# Patient Record
Sex: Female | Born: 1955 | Race: Black or African American | Hispanic: No | Marital: Married | Smoking: Never smoker
Health system: Southern US, Community
[De-identification: ages and names within clinical notes are randomized; demographics above are authoritative.]

## PROBLEM LIST (undated history)

## (undated) DIAGNOSIS — E119 Type 2 diabetes mellitus without complications: Secondary | ICD-10-CM

## (undated) DIAGNOSIS — M1990 Unspecified osteoarthritis, unspecified site: Secondary | ICD-10-CM

## (undated) DIAGNOSIS — Z9889 Other specified postprocedural states: Secondary | ICD-10-CM

## (undated) DIAGNOSIS — G5600 Carpal tunnel syndrome, unspecified upper limb: Secondary | ICD-10-CM

## (undated) DIAGNOSIS — I10 Essential (primary) hypertension: Secondary | ICD-10-CM

## (undated) DIAGNOSIS — R112 Nausea with vomiting, unspecified: Secondary | ICD-10-CM

## (undated) DIAGNOSIS — T7840XA Allergy, unspecified, initial encounter: Secondary | ICD-10-CM

## (undated) DIAGNOSIS — E785 Hyperlipidemia, unspecified: Secondary | ICD-10-CM

## (undated) DIAGNOSIS — N189 Chronic kidney disease, unspecified: Secondary | ICD-10-CM

## (undated) HISTORY — DX: Essential (primary) hypertension: I10

## (undated) HISTORY — PX: TUBAL LIGATION: SHX77

## (undated) HISTORY — PX: SPINE SURGERY: SHX786

## (undated) HISTORY — DX: Type 2 diabetes mellitus without complications: E11.9

## (undated) HISTORY — DX: Hyperlipidemia, unspecified: E78.5

## (undated) HISTORY — DX: Allergy, unspecified, initial encounter: T78.40XA

---

## 1979-02-09 HISTORY — PX: OVARIAN CYST REMOVAL: SHX89

## 1980-02-09 HISTORY — PX: CHOLECYSTECTOMY: SHX55

## 1998-04-09 ENCOUNTER — Other Ambulatory Visit: Admission: RE | Admit: 1998-04-09 | Discharge: 1998-04-09 | Payer: Self-pay | Admitting: Obstetrics and Gynecology

## 1998-09-02 ENCOUNTER — Encounter: Payer: Self-pay | Admitting: Obstetrics and Gynecology

## 1998-09-02 ENCOUNTER — Ambulatory Visit (HOSPITAL_COMMUNITY): Admission: RE | Admit: 1998-09-02 | Discharge: 1998-09-02 | Payer: Self-pay | Admitting: Obstetrics and Gynecology

## 1999-09-07 ENCOUNTER — Ambulatory Visit (HOSPITAL_COMMUNITY): Admission: RE | Admit: 1999-09-07 | Discharge: 1999-09-07 | Payer: Self-pay | Admitting: Obstetrics and Gynecology

## 1999-09-07 ENCOUNTER — Encounter: Payer: Self-pay | Admitting: Obstetrics and Gynecology

## 1999-12-17 ENCOUNTER — Other Ambulatory Visit: Admission: RE | Admit: 1999-12-17 | Discharge: 1999-12-17 | Payer: Self-pay | Admitting: *Deleted

## 2000-09-27 ENCOUNTER — Encounter: Payer: Self-pay | Admitting: Obstetrics and Gynecology

## 2000-09-27 ENCOUNTER — Ambulatory Visit (HOSPITAL_COMMUNITY): Admission: RE | Admit: 2000-09-27 | Discharge: 2000-09-27 | Payer: Self-pay | Admitting: Obstetrics and Gynecology

## 2000-11-29 ENCOUNTER — Ambulatory Visit (HOSPITAL_COMMUNITY): Admission: RE | Admit: 2000-11-29 | Discharge: 2000-11-29 | Payer: Self-pay | Admitting: *Deleted

## 2000-11-29 ENCOUNTER — Encounter: Payer: Self-pay | Admitting: *Deleted

## 2001-02-08 HISTORY — PX: ABDOMINAL HYSTERECTOMY: SHX81

## 2001-03-07 ENCOUNTER — Other Ambulatory Visit: Admission: RE | Admit: 2001-03-07 | Discharge: 2001-03-07 | Payer: Self-pay | Admitting: Obstetrics and Gynecology

## 2001-06-16 ENCOUNTER — Encounter: Admission: RE | Admit: 2001-06-16 | Discharge: 2001-06-28 | Payer: Self-pay | Admitting: Occupational Medicine

## 2001-09-14 ENCOUNTER — Encounter (INDEPENDENT_AMBULATORY_CARE_PROVIDER_SITE_OTHER): Payer: Self-pay | Admitting: *Deleted

## 2001-09-15 ENCOUNTER — Inpatient Hospital Stay (HOSPITAL_COMMUNITY): Admission: RE | Admit: 2001-09-15 | Discharge: 2001-09-16 | Payer: Self-pay | Admitting: Obstetrics and Gynecology

## 2001-10-04 ENCOUNTER — Encounter: Payer: Self-pay | Admitting: Obstetrics and Gynecology

## 2001-10-04 ENCOUNTER — Ambulatory Visit (HOSPITAL_COMMUNITY): Admission: RE | Admit: 2001-10-04 | Discharge: 2001-10-04 | Payer: Self-pay | Admitting: Obstetrics and Gynecology

## 2002-10-24 ENCOUNTER — Other Ambulatory Visit: Admission: RE | Admit: 2002-10-24 | Discharge: 2002-10-24 | Payer: Self-pay | Admitting: Obstetrics and Gynecology

## 2003-08-21 ENCOUNTER — Emergency Department (HOSPITAL_COMMUNITY): Admission: EM | Admit: 2003-08-21 | Discharge: 2003-08-21 | Payer: Self-pay | Admitting: Family Medicine

## 2005-06-15 ENCOUNTER — Ambulatory Visit (HOSPITAL_COMMUNITY): Admission: RE | Admit: 2005-06-15 | Discharge: 2005-06-15 | Payer: Self-pay | Admitting: *Deleted

## 2005-06-15 ENCOUNTER — Encounter (INDEPENDENT_AMBULATORY_CARE_PROVIDER_SITE_OTHER): Payer: Self-pay | Admitting: Specialist

## 2006-12-17 ENCOUNTER — Encounter: Admission: RE | Admit: 2006-12-17 | Discharge: 2006-12-17 | Payer: Self-pay | Admitting: Orthopedic Surgery

## 2010-06-09 ENCOUNTER — Inpatient Hospital Stay (INDEPENDENT_AMBULATORY_CARE_PROVIDER_SITE_OTHER)
Admission: RE | Admit: 2010-06-09 | Discharge: 2010-06-09 | Disposition: A | Discharge status: Home / Self Care | Payer: BC Managed Care – PPO | Source: Ambulatory Visit | Attending: Emergency Medicine | Admitting: Emergency Medicine

## 2010-06-09 DIAGNOSIS — J40 Bronchitis, not specified as acute or chronic: Secondary | ICD-10-CM

## 2010-06-09 DIAGNOSIS — J019 Acute sinusitis, unspecified: Secondary | ICD-10-CM

## 2010-06-26 NOTE — H&P (Signed)
NAME:  Ashley Monroe, Ashley Monroe                      ACCOUNT NO.:  [id number]   MEDICAL RECORD NO.:  [id number]                   PATIENT TYPE:  AMB   LOCATION:  SDC                                  FACILITY:  [facility]   PHYSICIAN:  Sheronette A. Cherly Hensen, M.D.         DATE OF BIRTH:  12/14/1955   DATE OF ADMISSION:  09/14/2001  DATE OF DISCHARGE:                                HISTORY & PHYSICAL   CHIEF COMPLAINT:  Heavy menstrual cycles, fibroid uterus and pelvic  relaxation disorder.   HISTORY OF PRESENT ILLNESS:  This is a 55 year old gravida 2, para 2,  married black female with a history of a laparoscopic tubal ligation, last  menstrual period of August 20, 2001, who is now being admitted for total  vaginal hysterectomy, perineoplasty, possible rectocele repair.  The patient  has had heavy menstrual cycles with the need to use overnight pads four  times a day, which are soaked, for the first five days of her five-to-seven-  day cycle.  Her cycles are every 28 to 30 days.  She has also noted soiling  of her linens and increased cramps.  This has been ongoing for more than a  year.  The patient, at that time, was unaware of any fibroids or thyroid  disorder.  She denied any intermenstrual or postcoital bleeding.  Ultrasound  in February of 2003 revealed a right ovary with a simple 1.1-cm cyst, left  ovary was normal, normal endometrial stripe and two small uterine fibroids;  the largest was 2.3 cm.  Endometrial biopsy in February of 2003 showed  benign proliferative endometrium.  TSH was normal.  The patient's history is  notable for cervical dysplasia diagnosed in December of 1994.  Her most  recent Pap in January of 2003 showed atypical squamous cells with a high-  risk HPV capture that was negative subsequently.  The patient has also noted  lack of sensation during intercourse as a result of a gaping introitus; her  relationship otherwise is satisfactory with her husband.  She denies any  dyspareunia.  The patient desires surgical management of her menorrhagia and  uterine fibroids as well as her pelvic relaxation disorder.  She was found  to have a rectocele on examination and a mild cystocele, however, the  patient denied any urinary incontinence, urgency or dysuria.   ALLERGIES:  CODEINE.   MEDICATIONS:  1. Univasc 7.5 mg q.d.  2. Verapamil ER 250 mg q.d.  3. Topamax 75 mg q.h.s.   PAST MEDICAL HISTORY:  Chronic hypertension, diet-controlled adult-onset  diabetes, history of pseudotumor cerebri.   PAST SURGICAL HISTORY:  Tubal ligation, cryosurgery, laparoscopic  cholecystectomy, ovarian cystectomy in 1982, laparoscopically.   OBSTETRICAL HISTORY:  Vaginal delivery x2.   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   FAMILY HISTORY:  Paternal aunt -- breast cancer; diabetes in father, brother  and grandparents; cardiovascular disease in  paternal grandfather; mother has  hypertension and thyroid dysfunction.   SOCIAL HISTORY:  Married, two children.  She is a Chartered certified accountant at [employer].  She is a nonsmoker.   REVIEW OF SYSTEMS:  Migraine headaches secondary to the pseudotumor cerebri.   PHYSICAL EXAMINATION:  GENERAL:  Well-developed, well-nourished, obese black  female in no acute distress.   VITAL SIGNS:  Blood pressure 140/90, pulse of 92.  Weight is 250 pounds.   SKIN:  No lesions.   HEENT:  Anicteric sclerae, pink conjunctivae.  Oropharynx negative.   HEART:  Regular rate and rhythm without murmur.   NECK:  Supple.  No palpable thyroid.   NODES:  No axillary, supraclavicular or inguinal nodes palpable.   LUNGS:  Clear to auscultation.   BREASTS  Breasts are soft, nontender, no palpable mass, pedunculated, upward  nipples without discharge; left is greater than right.   ABDOMEN:  Abdomen is obese, soft.  No hepatosplenomegaly noted.  Infraumbilical and some small upper abdominal scars  noted.   BACK:  No CVA tenderness.   PELVIC:  External genitalia -- no palpable mass.  Visible gaping introitus  with a bulge posteriorly and anteriorly.  Vagina -- first degree cystocele  and rectocele noted.  Cervix -- no lesions.  Uterus anteverted but slightly  enlarged but limited by the patient's body habitus.  Adnexa -- no palpable  mass but again, limited by the patient's body habitus.  Urethra normal.  Bladder -- no palpable mass.   RECTAL:  Normal tone.  Negative rectovaginal septum except the confirmed  rectocele, mild, noted.  Guaiac was not done.   LABORATORY AND ACCESSORY CLINICAL DATA:  Mammogram, September 27, 2001, was  negative.   IMPRESSION:  1. Menorrhagia.  2. Uterine fibroids.  3. Pelvic relaxation disorder.  4. Chronic hypertension.  5. Adult-onset diabetes.  6. History of pseudotumor cerebri.  7. Exogenous obesity.   PLAN:  Admission, total vaginal hysterectomy, perineoplasty, possible  rectocele repair, antibiotic prophylaxis, antiembolic stockings, resumption  of her antihypertensive medications postoperatively.  Risks of the procedure  are explained to the patient including, but not limited to, inability to  complete the hysterectomy vaginally, infection, bleeding which may require  blood transfusion, fistula formation, injury to surrounding organ structures  such as the bladder, bowel or ureters, possible need for surgery in the  future to remove ovaries secondary to persistent ovarian cyst, ovarian  cancer; blood transfusion risks include, but not limited to, acute reaction,  hepatitis transmission, HIV transmission, 1 out of 100,000; risks of  rectocele repair were also discussed including possible need for repeat  surgery in the future, dyspareunia, distortion of the anatomy and possible  need for a cystocele repair in the future.  Postop care and criteria for discharge were discussed with the patient, as was the need for continued Pap  smears in  the future due to her history and despite a hysterectomy.  Dr. Brooke Bonito is the patient's primary and will be involved as necessary if any  of her medical conditions necessitate such.                                               Sheronette A. Cherly Hensen, M.D.    SAC/MEDQ  D:  09/14/2001  T:  09/14/2001  Job:  [id number]

## 2010-06-26 NOTE — Op Note (Signed)
NAMESIERRA, SPARGO            ACCOUNT NO.:  [id number]   MEDICAL RECORD NO.:  [id number]          PATIENT TYPE:  AMB   LOCATION:  ENDO                         FACILITY:  [facility]   PHYSICIAN:  Georgiana Spinner, M.D.    DATE OF BIRTH:  November 29, 1955   DATE OF PROCEDURE:  06/15/2005  DATE OF DISCHARGE:                                 OPERATIVE REPORT   PROCEDURE:  Upper endoscopy.   INDICATIONS:  GERD.   ANESTHESIA:  Demerol 50, Versed 5 mg.   PROCEDURE:  With the patient mildly sedated in left lateral decubitus  position, the Olympus videoscopic endoscope was inserted in the mouth,  passed under direction vision through the esophagus, which appeared normal,  into the stomach.  Fundus, body, antrum, duodenal bulb, second portion of  duodenum all appeared normal.  From this point, the endoscope was slowly  withdrawn, taking circumferential views of the duodenal mucosa until the  endoscope had been pulled back into the stomach, placed in retroflex and  viewed the stomach from below.  The endoscope was straightened and  withdrawn, taking circumferential views of the remaining gastric and  esophageal mucosa.  The patient's vital signs and pulse oximeter remained  stable.  The patient tolerated the procedure well without apparent  complication.   FINDINGS:  Unremarkable examination.   PLAN:  Proceed to colonoscopy.           ______________________________  Georgiana Spinner, M.D.     GMO/MEDQ  D:  06/15/2005  T:  06/16/2005  Job:  [id number]

## 2010-06-26 NOTE — Discharge Summary (Signed)
NAME:  Ashley Monroe, Ashley Monroe                      ACCOUNT NO.:  [id number]   MEDICAL RECORD NO.:  [id number]                   PATIENT TYPE:  INP   LOCATION:  9304                                 [facility]ACILITY:  [facility]   PHYSICIAN:  Sheronette A. Cherly Hensen, M.D.         DATE OF BIRTH:  11-14-1955   DATE OF ADMISSION:  09/14/2001  DATE OF DISCHARGE:  09/16/2001                                 DISCHARGE SUMMARY   ADMISSION DIAGNOSES:  1. Menorrhagia.  2. Uterine fibroids.  3. Pelvic relaxation disorder.   DISCHARGE DIAGNOSES:  1. Menorrhagia.  2. Uterine fibroids.  3. Enterocele.  4. Postoperative atelectasis.  5. Rectocele.  6. Cystocele.   PROCEDURE:  Examination under anesthesia, total vaginal hysterectomy,  bilateral partial salpingectomy, enterocele repair, McCall culdoplasty, and  posterior repair.   HISTORY OF PRESENT ILLNESS:  A 55 year old gravida 2, para 2 married black  female with history of laparoscopic tubal ligation admitted for total  vaginal hysterectomy, perineoplasty, possible rectocele repair secondary to  menorrhagia, uterine fibroids, and pelvic relaxation as noted.   HOSPITAL COURSE:  The patient was admitted to [facility].  She was  taken to the operating room where she underwent the above procedures.  Findings were a 10 week sized uterus, normal ovaries bilaterally, normal  tubes, elongated enterocele, large rectocele, first and second degree  cystocele.  The patient's postoperative course was notable for a temperature  spike of 101 for which blood cultures were obtained.  Urinalysis showed  small hemoglobin.  A repeat CBC showed a white count of 10.1, platelet count  of 75,000, and hematocrit of 37.3.  The fever was thought to be secondary to  atelectasis after examination did not reveal any other source.  Her abdomen  was soft, nondistended, good bowel sounds.  Scant blood in the vagina.  Rectovaginal examination:  No palpable mass and no stool in the  vault.  The  patient was otherwise tolerating a regular diet.  Incentive spirometry and  deep breathe and cough was encouraged.  The patient subsequently  defervesced.  Final blood culture was negative as was the urine culture.  The patient subsequently passed flatus, tolerating a regular diet, otherwise  unremarkable and therefore decision was made to discharge the patient.   DISPOSITION:  Home.   CONDITION ON DISCHARGE:  Stable.   DISCHARGE MEDICATIONS:  1. Tylox one to two tablets q.3-4h. p.r.n. pain.  2. Bextra 20 mg one to two tablets p.o. q.d. p.r.n. pain.  3. Colace 100 mg p.o. b.i.d.   FOLLOW UP:  Four weeks at [facility].   DISCHARGE INSTRUCTIONS:  Sitz baths t.i.d.  No strenuated bowel movements.  Call for temperature 100.4 or greater.  No heavy lifting or driving.  Nothing per vagina for four to six weeks.  Call if severe abdominal pain,  nausea, vomiting, increased bleeding from vagina.  Sheronette A. Cherly Hensen, M.D.    SAC/MEDQ  D:  10/27/2001  T:  10/30/2001  Job:  [id number]

## 2010-06-26 NOTE — Op Note (Signed)
NAMEMADIGAN, ROSENSTEEL            ACCOUNT NO.:  [id number]   MEDICAL RECORD NO.:  [id number]          PATIENT TYPE:  AMB   LOCATION:  ENDO                         FACILITY:  [facility]   PHYSICIAN:  Georgiana Spinner, M.D.    DATE OF BIRTH:  06-18-1955   DATE OF PROCEDURE:  06/15/2005  DATE OF DISCHARGE:                                 OPERATIVE REPORT   PROCEDURE:  Colonoscopy.   ENDOSCOPIST:  Georgiana Spinner, M.D.   INDICATIONS:  Diarrhea; colon cancer screening.   ANESTHESIA:  Demerol 20 mg, Versed 2 mg.   DESCRIPTION OF PROCEDURE:  With the patient mildly sedated in the left  lateral decubitus position, a rectal examination was performed, which  revealed trace-positive material.  Subsequently, the Olympus videoscopic  colonoscope was inserted into the rectum and passed under direct vision to  the cecum, identified by ileocecal valve and appendiceal orifice, both of  which were photographed.  From this point, the colonoscope was slowly  withdrawn, taking circumferential views of the colonic mucosa, stopping only  in the rectum, which appeared normal on direct and retroflexed view.  We  took random biopsies along the way of normal-appearing mucosa.  The  endoscope was straightened and withdrawn.  The patient's vital signs and  pulse oximetry remained stable.  The patient tolerated the procedure well  without apparent complications.   FINDINGS:  Internal hemorrhoids, otherwise an unremarkable colonoscopic  examination to the cecum.   PLAN:  Await biopsy reports.  The patient will call me for results and  follow up with me as needed as an outpatient.           ______________________________  Georgiana Spinner, M.D.     GMO/MEDQ  D:  06/15/2005  T:  06/16/2005  Job:  [id number]

## 2010-06-26 NOTE — Op Note (Signed)
NAME:  Ashley Monroe, Ashley Monroe                      ACCOUNT NO.:  [id number]   MEDICAL RECORD NO.:  [id number]                   PATIENT TYPE:  OBV   LOCATION:  9304                                 [facility]ACILITY:  [facility]   PHYSICIAN:  Sheronette A. Cherly Hensen, M.D.         DATE OF BIRTH:  Jun 23, 1955   DATE OF PROCEDURE:  09/14/2001  DATE OF DISCHARGE:                                 OPERATIVE REPORT   PREOPERATIVE DIAGNOSES:  1. Menorrhagia.  2. Uterine fibroids.  3. Pelvic relaxation disorder.   POSTOPERATIVE DIAGNOSES::  1. Menorrhagia.  2. Uterine fibroids.  3. Cystocele.  4. Rectocele.  5. Enterocele.   PROCEDURES:  1. Examination under anesthesia.  2. Total vaginal hysterectomy.  3. Bilateral partial salpingectomy.  4. Enterocele repair.  5. McCall culdoplasty.  6. Rectocele repair.   ANESTHESIA:  General.   SURGEON:  Sheronette A. Cousins, M.D.   SPECIMENS:  Enterocele sac, uterus and cervix, and distal portion of lower  fallopian tubes.   ESTIMATED BLOOD LOSS:  350 cc.   INTRAOPERATIVE FLUIDS:  2800 cc crystalloid.   URINE OUTPUT:  About 100 cc clear yellow urine.   COMPLICATIONS:  None.   INDICATIONS:  This is a 55 year old gravida 2, para 2, married black female  with a history of tubal ligation, with menorrhagia and associated uterine  fibroids, who presents for surgical management.  The patient also was found  to have both a rectocele and a cystocele.  She was asymptomatic from the  cystocele.  The patient, however, has noted decreased sensation with  intercourse as a result of a voluminous and gaping vagina.  The patient now  presents for surgical management.  Risks and benefits of the procedures have  been explained to the patient.  Consent was signed, and the patient was  transferred to the operating room, Ancef was infused for antibiotic  prophylaxis, and antiembolic stockings were in place.   DESCRIPTION OF PROCEDURE:  The patient was taken to the operating  room,  where she was placed in the dorsal lithotomy position using Allen stirrups.  General anesthesia was induced without incident.  Examination under  anesthesia revealed an eight to 10-week size uterus.  No adnexal masses  could be appreciated.  The patient was sterilely prepped and draped in the  usual fashion.  The bladder was catheterized for a small amount of urine.  A  bivalve speculum was placed in the vagina.  The anterior and posterior lip  of the cervix were grasped with a Jacobson clamp.  A 1:1 dilute solution of  Pitressin was injected circumferentially at the junction of the cervical-  vaginal area.  A circumferential incision was then made anteriorly and made  a V posteriorly.  Mayo scissors were then used to sharply perform  dissection.  Systematically the posterior cul-de-sac was opened and extended  bilaterally.  Anteriorly the bladder was continued to be pushed upward with  sharp dissection.  The uterosacral ligaments  were subsequently bilaterally  clamped, cut, suture ligated with 0 Vicryl suture.  The cardinal ligaments  were then clamped, cut, and suture ligated using 0 Vicryl suture.  Attempt  again was made to open the anterior cul-de-sac.  This was subsequently  opened without incident, and the hysterectomy was then continued with the  uterine vessels being bilaterally clamped, cut, and suture ligated with 0  Vicryl suture.  Additional clamp, cut, and suture ligation was then  performed until the utero-ovarian ligaments and round ligament complex were  then identified, at which time they were clamped, cut, and attention was  then turned to the contralateral side, which with the mobility of the uterus  now, the left utero-ovarian ligament and round ligament were clamped  proximally and distal to the uterus, and the uterus severed from its  attachment.  The pedicles were then on the right closed with a Heaney suture  of 0 Vicryl and on the left because of two  pedicles were free-tied and  suture ligated with 0 Vicryl, and this was performed.  Subsequently more  bleeding was noted on that left-hand pedicle, at which time a 0 Vicryl was  then placed in a free-tie fashion.  Good hemostasis then noted.  The  ovaries, though prominent, were normal bilaterally.  The fallopian tubes  bilaterally were quite elongated and continued to descend into the vaginal  aspect of the surgery, at which time the decision was then made to perform a  distal partial salpingectomy for future.  The left fallopian tube was  grasped and extended and the end clamped as far proximally as possible and  severed from its attachment.  The subsequent pedicle was then suture ligated  with 3-0 Vicryl suture.  The same procedure was performed on the  contralateral side with respect to the right fallopian tube.  Inspection of  the vagina, which was voluminous, was notable for a large rectocele  proximally, second degree cystocele.  At the proximal aspect of the vaginal  cuff, there was a small indentation in the peritoneum consistent with an  enterocele.  The sac of the enterocele was isolated and a pursestring of 2-0  Vicryl was then placed circumferentially and the sac was then cut for  hemostasis.  Attention was then turned to the uterosacral ligaments, where 0  Vicryl was then run along the posterior aspect of the cul-de-sac in order to  perform a culdoplasty.  This was then tagged and tied.  Attention was then  turned to performing the rectocele.  The rectal exam was performed.  The  rectocele was noted to be fairly extensive and extended somewhat superiorly.  The hymenal ring was then identified at its furthest edge.  An inverted  triangle incision was then made on the perineum and in the midline, the  posterior vaginal wall was undermined and a vertical incision was then  concomitantly made all the way to the superior aspect of the vaginal cuff. The posterior wall was then  bluntly dissected off the posterior vagina on  the right as well as on the left, and a small amount of the dissection  necessitated the scalpel.  Once the rectocele was mobilized, there was noted  to be a fair amount of redundant tissue in the rectal area and pursestring 0  Vicryl sutures were then made in order to close down the defect.  Small  bleeders were cauterized throughout with good hemostasis noted under the  surface.  Vicryl 0 interrupted figure-of-eight sutures were  then placed to  reinforce over the repaired bladder, and at this point attention was then  placed toward completing the procedure.  The vagina was begun to be closed  with interrupted 0 Vicryl suture.  The redundant tissue from the rectocele  was removed following the line of the labia major.  The uterosacral  ligaments were then approximated in the midline and the rest of the vagina  was then closed with interrupted 0 Vicryl single suture.  When the hymenal  ring was reached from the closure of the vagina in its midline, 3-0 chromic  suture was then used to close the perineum in consistent form of an  episiotomy repair.  The vagina was then irrigated, suctioned, a vaginal  packing was placed with estrogen cream, triple sulfa was not available.  An  indwelling Foley catheter was then subsequently sterilely placed.  Rectal  examination did not reveal any defect.  Sponge and instrument counts x2 were  correct.  The patient tolerated the procedure well, was transferred to the  recovery room in stable condition.                                                 Sheronette A. Cherly Hensen, M.D.    SAC/MEDQ  D:  09/14/2001  T:  09/16/2001  Job:  [id number]

## 2010-11-14 ENCOUNTER — Ambulatory Visit (INDEPENDENT_AMBULATORY_CARE_PROVIDER_SITE_OTHER): Payer: BC Managed Care – PPO

## 2010-11-14 ENCOUNTER — Inpatient Hospital Stay (INDEPENDENT_AMBULATORY_CARE_PROVIDER_SITE_OTHER)
Admission: RE | Admit: 2010-11-14 | Discharge: 2010-11-14 | Disposition: A | Discharge status: Home / Self Care | Payer: BC Managed Care – PPO | Source: Ambulatory Visit | Attending: Family Medicine | Admitting: Family Medicine

## 2010-11-14 DIAGNOSIS — I10 Essential (primary) hypertension: Secondary | ICD-10-CM

## 2010-11-14 DIAGNOSIS — S96819A Strain of other specified muscles and tendons at ankle and foot level, unspecified foot, initial encounter: Secondary | ICD-10-CM

## 2010-11-14 DIAGNOSIS — M7660 Achilles tendinitis, unspecified leg: Secondary | ICD-10-CM

## 2010-11-14 DIAGNOSIS — S93499A Sprain of other ligament of unspecified ankle, initial encounter: Secondary | ICD-10-CM

## 2010-11-17 ENCOUNTER — Other Ambulatory Visit: Payer: Self-pay

## 2010-11-17 DIAGNOSIS — M7660 Achilles tendinitis, unspecified leg: Secondary | ICD-10-CM

## 2010-11-17 DIAGNOSIS — M7730 Calcaneal spur, unspecified foot: Secondary | ICD-10-CM

## 2010-11-19 ENCOUNTER — Ambulatory Visit
Admission: RE | Admit: 2010-11-19 | Discharge: 2010-11-19 | Disposition: A | Discharge status: Home / Self Care | Payer: BC Managed Care – PPO | Source: Ambulatory Visit

## 2010-11-19 DIAGNOSIS — M7660 Achilles tendinitis, unspecified leg: Secondary | ICD-10-CM

## 2010-11-19 DIAGNOSIS — M7730 Calcaneal spur, unspecified foot: Secondary | ICD-10-CM

## 2011-03-10 ENCOUNTER — Ambulatory Visit: Payer: BC Managed Care – PPO | Admitting: Physical Therapy

## 2011-03-10 DIAGNOSIS — IMO0001 Reserved for inherently not codable concepts without codable children: Secondary | ICD-10-CM | POA: Insufficient documentation

## 2011-03-10 DIAGNOSIS — M25579 Pain in unspecified ankle and joints of unspecified foot: Secondary | ICD-10-CM | POA: Insufficient documentation

## 2011-03-22 ENCOUNTER — Ambulatory Visit: Payer: BC Managed Care – PPO | Admitting: Rehabilitation

## 2011-03-22 DIAGNOSIS — IMO0001 Reserved for inherently not codable concepts without codable children: Secondary | ICD-10-CM | POA: Insufficient documentation

## 2011-03-22 DIAGNOSIS — M25579 Pain in unspecified ankle and joints of unspecified foot: Secondary | ICD-10-CM | POA: Insufficient documentation

## 2011-03-24 ENCOUNTER — Ambulatory Visit: Payer: BC Managed Care – PPO | Admitting: Physical Therapy

## 2011-03-30 ENCOUNTER — Ambulatory Visit: Payer: BC Managed Care – PPO | Admitting: Physical Therapy

## 2011-04-01 ENCOUNTER — Ambulatory Visit: Payer: BC Managed Care – PPO | Admitting: Physical Therapy

## 2011-04-06 ENCOUNTER — Ambulatory Visit: Payer: BC Managed Care – PPO | Admitting: Physical Therapy

## 2011-04-07 ENCOUNTER — Ambulatory Visit: Payer: BC Managed Care – PPO | Admitting: Physical Therapy

## 2011-04-12 ENCOUNTER — Ambulatory Visit: Payer: BC Managed Care – PPO | Admitting: Physical Therapy

## 2011-04-12 DIAGNOSIS — IMO0001 Reserved for inherently not codable concepts without codable children: Secondary | ICD-10-CM | POA: Insufficient documentation

## 2011-04-12 DIAGNOSIS — M25579 Pain in unspecified ankle and joints of unspecified foot: Secondary | ICD-10-CM | POA: Insufficient documentation

## 2011-04-14 ENCOUNTER — Ambulatory Visit: Payer: BC Managed Care – PPO | Admitting: Physical Therapy

## 2013-05-09 LAB — HM COLONOSCOPY

## 2013-07-03 ENCOUNTER — Other Ambulatory Visit: Payer: Self-pay

## 2013-07-03 DIAGNOSIS — Z1231 Encounter for screening mammogram for malignant neoplasm of breast: Secondary | ICD-10-CM

## 2013-07-06 ENCOUNTER — Ambulatory Visit
Admission: RE | Admit: 2013-07-06 | Discharge: 2013-07-06 | Disposition: A | Discharge status: Home / Self Care | Payer: BC Managed Care – PPO | Source: Ambulatory Visit

## 2013-07-06 DIAGNOSIS — Z1231 Encounter for screening mammogram for malignant neoplasm of breast: Secondary | ICD-10-CM

## 2013-07-09 ENCOUNTER — Ambulatory Visit: Payer: BC Managed Care – PPO

## 2015-04-23 ENCOUNTER — Encounter: Payer: Self-pay | Admitting: *Deleted

## 2015-04-23 ENCOUNTER — Encounter: Payer: BLUE CROSS/BLUE SHIELD | Attending: Internal Medicine | Admitting: *Deleted

## 2015-04-23 VITALS — Ht 65.0 in | Wt 241.8 lb

## 2015-04-23 DIAGNOSIS — E119 Type 2 diabetes mellitus without complications: Secondary | ICD-10-CM | POA: Diagnosis not present

## 2015-04-23 NOTE — Patient Instructions (Signed)
Plan:  Aim for 2-3 Carb Choices per meal (30-45 grams) +/- 1 either way  Aim for 0-15 Carbs per snack if hungry  Include protein in moderation with your meals and snacks Consider reading food labels for Total Carbohydrate and Fat Grams of foods Consider  increasing your activity level daily as tolerated Consider checking BG at alternate times per day as directed by MD  continue taking medication as directed by MD  4:30 - Test glucose and have a snack 8:30 - Breakfast - Metformin 12:00 - Test - Lunch 2:00 Test - Chill - snack if needed 5:30 Off work - Snack 8:00 Dinner - Levemir, Metformin, Onglyza 11:00 Test - Bed  Consider snacks/Breakfast: [employer] Greek Yogurt Bread & peanut butter  What affects your sugar?  Food - stress - activity - sleep (lack there of)

## 2015-04-23 NOTE — Progress Notes (Signed)
Diabetes Self-Management Education  Visit Type: First/Initial  Appt. Start Time: 1400 Appt. End Time: 1530  04/23/2015  Ms. Ashley Monroe, identified by name and date of birth, is a 60 y.o. female with a diagnosis of Diabetes: Type 2. Ashley Monroe presents for a "tune Up" on her DSME. She received education at the time of diagnosis. Her primary interest is review the dietary recommendations. We reviewed various reasons for her glucose being out of control at present. This would include, eating behavior, lack of activity, stress and lack of sleep.  ASSESSMENT  Height 5\' 5"  (1.651 m), weight 241 lb 12.8 oz (109.68 kg). Body mass index is 40.24 kg/(m^2).      Diabetes Self-Management Education - 04/23/15 1414    Visit Information   Visit Type First/Initial   Initial Visit   Diabetes Type Type 2   Are you currently following a meal plan? No   Are you taking your medications as prescribed? Yes   Date Diagnosed 2007   Health Coping   How would you rate your overall health? Fair   Psychosocial Assessment   Patient Belief/Attitude about Diabetes Motivated to manage diabetes   Self-care barriers None   Self-management support Doctor's office;CDE visits   Other persons present Patient   Patient Concerns Nutrition/Meal planning;Medication;Monitoring;Healthy Lifestyle;Glycemic Control   Special Needs None   Preferred Learning Style No preference indicated   Learning Readiness Change in progress   How often do you need to have someone help you when you read instructions, pamphlets, or other written materials from your doctor or pharmacy? 2 - Rarely   What is the last grade level you completed in school? 2 1/2 yrs @ [employer]   Complications   Last HgB A1C per patient/outside source 8.3 %   How often do you check your blood sugar? 3-4 times/day   Fasting Blood glucose range (mg/dL) 2008   Have you had a dilated eye exam in the past 12 months? Yes   Have you had a dental  exam in the past 12 months? Yes   Are you checking your feet? Yes   How many days per week are you checking your feet? 6   Dietary Intake   Breakfast oatmeal / boiled egg & [id number] bacon X2 / activia yogurt / water / coffee equal & creamer   Lunch salmon & crackrs, water / tossed salade & water & fruit cup / Frozen meals  (microwave)   Dinner baked pork, chicken, burger, beef, salmon + 2 vegetables    Beverage(s) water, coffee, Fruit 2-0,   Exercise   Exercise Type ADL's   Patient Education   Previous Diabetes Education Yes (please comment)  At time of diagnosis   Disease state  Definition of diabetes, type 1 and 2, and the diagnosis of diabetes;Factors that contribute to the development of diabetes   Nutrition management  Role of diet in the treatment of diabetes and the relationship between the three main macronutrients and blood glucose level;Carbohydrate counting;Reviewed blood glucose goals for pre and post meals and how to evaluate the patients' food intake on their blood glucose level.;Information on hints to eating out and maintain blood glucose control.;Meal options for control of blood glucose level and chronic complications.   Physical activity and exercise  Role of exercise on diabetes management, blood pressure control and cardiac health.   Monitoring Purpose and frequency of SMBG.;Identified appropriate SMBG and/or A1C goals.   Psychosocial adjustment Role of stress on diabetes   Individualized  Goals (developed by patient)   Nutrition General guidelines for healthy choices and portions discussed   Monitoring  test my blood glucose as discussed   Problem Solving How do you get more sleep during the week. Son caring for his child and allowing you to go to bed.   Outcomes   Expected Outcomes Demonstrated interest in learning. Expect positive outcomes   Future DMSE PRN   Program Status Completed      Individualized Plan for Diabetes Self-Management Training:   Learning  Objective:  Patient will have a greater understanding of diabetes self-management. Patient education plan is to attend individual and/or group sessions per assessed needs and concerns.   Plan:   Patient Instructions  Plan:  Aim for 2-3 Carb Choices per meal (30-45 grams) +/- 1 either way  Aim for 0-15 Carbs per snack if hungry  Include protein in moderation with your meals and snacks Consider reading food labels for Total Carbohydrate and Fat Grams of foods Consider  increasing your activity level daily as tolerated Consider checking BG at alternate times per day as directed by MD  continue taking medication as directed by MD  4:30 - Test glucose and have a snack 8:30 - Breakfast - Metformin 12:00 - Test - Lunch 2:00 Test - Chill - snack if needed 5:30 Off work - Snack 8:00 Dinner - Levemir, Metformin, Onglyza 11:00 Test - Bed  Consider snacks/Breakfast: [employer] & fit Greek Yogurt Bread & peanut butter  What affects your sugar?  Food - stress - activity - sleep (lack there of)   Expected Outcomes:  Demonstrated interest in learning. Expect positive outcomes  Education material provided: Living Well with Diabetes, A1C conversion sheet and Meal plan card  If problems or questions, patient to contact team via:  Phone  Future DSME appointment: PRN

## 2015-05-07 ENCOUNTER — Encounter: Payer: Self-pay | Admitting: Internal Medicine

## 2016-08-12 DIAGNOSIS — E1122 Type 2 diabetes mellitus with diabetic chronic kidney disease: Secondary | ICD-10-CM | POA: Insufficient documentation

## 2016-08-12 DIAGNOSIS — N182 Chronic kidney disease, stage 2 (mild): Secondary | ICD-10-CM | POA: Insufficient documentation

## 2016-08-12 DIAGNOSIS — R079 Chest pain, unspecified: Secondary | ICD-10-CM | POA: Insufficient documentation

## 2016-08-12 DIAGNOSIS — N08 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: Secondary | ICD-10-CM | POA: Insufficient documentation

## 2016-08-12 DIAGNOSIS — Z79899 Other long term (current) drug therapy: Secondary | ICD-10-CM | POA: Insufficient documentation

## 2016-08-12 DIAGNOSIS — I129 Hypertensive chronic kidney disease with stage 1 through stage 4 chronic kidney disease, or unspecified chronic kidney disease: Secondary | ICD-10-CM | POA: Insufficient documentation

## 2016-08-17 ENCOUNTER — Ambulatory Visit (INDEPENDENT_AMBULATORY_CARE_PROVIDER_SITE_OTHER): Payer: BLUE CROSS/BLUE SHIELD | Admitting: Cardiovascular Disease

## 2016-08-17 ENCOUNTER — Encounter: Payer: Self-pay | Admitting: Cardiovascular Disease

## 2016-08-17 VITALS — BP 141/86 | HR 87 | Ht 65.0 in | Wt 231.2 lb

## 2016-08-17 DIAGNOSIS — I10 Essential (primary) hypertension: Secondary | ICD-10-CM | POA: Diagnosis not present

## 2016-08-17 DIAGNOSIS — R0789 Other chest pain: Secondary | ICD-10-CM | POA: Diagnosis not present

## 2016-08-17 DIAGNOSIS — E7800 Pure hypercholesterolemia, unspecified: Secondary | ICD-10-CM | POA: Diagnosis not present

## 2016-08-17 MED ORDER — ROSUVASTATIN CALCIUM 20 MG PO TABS: 20.0000 mg | ORAL_TABLET | Freq: Every day | ORAL | 5 refills | Status: DC

## 2016-08-17 MED ORDER — AMLODIPINE BESYLATE 5 MG PO TABS: 5.0000 mg | ORAL_TABLET | Freq: Every day | ORAL | 5 refills | Status: DC

## 2016-08-17 NOTE — Progress Notes (Signed)
Cardiology Office Note   Date:  08/19/2016   ID:  ZAKEYA JUNKER, DOB 1955-12-03, MRN [id number]  PCP:  Dorothyann Peng, MD  Cardiologist:   Chilton Si, MD   Chief Complaint  Patient presents with  . New Patient (Initial Visit)      History of Present Illness: Ashley Monroe is a 61 y.o. female with hypertension, diabetes, and CKD II who is being seen today for the evaluation of chest pain at the request of Dorothyann Peng, MD.  Ms. Childers saw Dr. Allyne Gee on 06/30/16 and reported chest pain.  She has noted epigastric soreness that is worse when she is in bed.  The pain has been so bad at times that she can't turn in bed.  She also noted chest congestion and dyspnea on exertion.  It feels like an 8/10 pulling sensation.  For the last three months she has constant discomfort in her epigastrum and chest.  It is associated with shortness of breath.  She doesn't typically have nausea or diaphoresis but did with one episode.  Ms. Klimas does not get much exercise.  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  Ms. Bruney denies lower extremity edema, orthopnea, or PND.  Her BP typically runs in the 130s/80s.  She reports occasional palpitations but no lightheadedness or dizziness.    Past Medical History:  Diagnosis Date  . Allergy   . Diabetes mellitus without complication (HCC)   . Hyperlipidemia 08/19/2016  . Hypertension     Past Surgical History:  Procedure Laterality Date  . ABDOMINAL HYSTERECTOMY    . CHOLECYSTECTOMY    . OVARIAN CYST REMOVAL       Current Outpatient Prescriptions  Medication Sig Dispense Refill  . cetirizine (ZYRTEC) 10 MG tablet Take 10 mg by mouth daily.    . Insulin Detemir (LEVEMIR FLEXTOUCH) 100 UNIT/ML Pen Inject 10 Units as directed at bedtime.    . insulin detemir (LEVEMIR) 100 UNIT/ML injection Inject 6 Units into the skin daily after supper.    . metFORMIN (GLUCOPHAGE) 500 MG tablet Take by mouth 2  (two) times daily with a meal.    . olmesartan-hydrochlorothiazide (BENICAR HCT) 40-25 MG tablet Take 1 tablet by mouth daily.    . saxagliptin HCl (ONGLYZA) 5 MG TABS tablet Take 5 mg by mouth.    Marland Kitchen amLODipine (NORVASC) 5 MG tablet Take 1 tablet (5 mg total) by mouth daily. 30 tablet 5  . rosuvastatin (CRESTOR) 20 MG tablet Take 1 tablet (20 mg total) by mouth daily. 30 tablet 5   No current facility-administered medications for this visit.     Allergies:   Amoxicillin and Codeine    Social History:  The patient  reports that she has never smoked. She has never used smokeless tobacco.   Family History:  The patient's family history includes Alzheimer's disease in her father and maternal grandmother; Aneurysm in her mother; Atrial fibrillation in her brother and mother; Cancer in her paternal grandmother; Diabetes in her brother and father; Heart attack in her maternal grandfather and mother; Stroke in her brother and mother.    ROS:  Please see the history of present illness.   Otherwise, review of systems are positive for none.   All other systems are reviewed and negative.    PHYSICAL EXAM: VS:  BP (!) 141/86   Pulse 87   Ht 5\' 5"  (1.651 m)   Wt 104.9 kg (231  lb 3.2 oz)   BMI 38.47 kg/m  , BMI Body mass index is 38.47 kg/m. GENERAL:  Well appearing HEENT:  Pupils equal round and reactive, fundi not visualized, oral mucosa unremarkable NECK:  No jugular venous distention, waveform within normal limits, carotid upstroke brisk and symmetric, no bruits, no thyromegaly LYMPHATICS:  No cervical adenopathy LUNGS:  Clear to auscultation bilaterally HEART:  RRR.  PMI not displaced or sustained,S1 and S2 within normal limits, no S3, no S4, no clicks, no rubs, no murmurs ABD:  Flat, positive bowel sounds normal in frequency in pitch, no bruits, no rebound, no guarding, no midline pulsatile mass, no hepatomegaly, no splenomegaly EXT:  2 plus pulses throughout, no edema, no cyanosis no  clubbing SKIN:  No rashes no nodules NEURO:  Cranial nerves II through XII grossly intact, motor grossly intact throughout PSYCH:  Cognitively intact, oriented to person place and time   EKG:  EKG is ordered today. The ekg ordered today demonstrates Sinus rhythm. 87 bpm.   Recent Labs: No results found for requested labs within last 8760 hours.   06/30/16: Sodium 140, potassium 4.1, BUN 18, creatinine 0.85 AST 17, ALT 19 Total cholesterol 196, triglycerides 277, HDL 38, LDL 103  Lipid Panel No results found for: CHOL, TRIG, HDL, CHOLHDL, VLDL, LDLCALC, LDLDIRECT    Wt Readings from Last 3 Encounters:  08/17/16 104.9 kg (231 lb 3.2 oz)  04/23/15 109.7 kg (241 lb 12.8 oz)      ASSESSMENT AND PLAN:  # Atypcial chest pain:  Ms. Wragg's chest pain is atypical and the episodes that occur laying down at night are concerning for GERD.  However, she has several risk factors.  We will get an ETT to assess for ischemia.   # Hyperlipidemia: ASCVD 10 year Risk is 23.4%. We will start rosuvastatin 20 mg daily.  She will need lipids and a CMP at follow-up.  Continue aspirin 81 mg daily.   # Hypertension: Blood pressure is poorly-controlled. Add amlodipine 5 mg daily. Continue on olmesartan 40 mg and hydrochlorothiazide 25 mg daily.  Current medicines are reviewed at length with the patient today.  The patient does not have concerns regarding medicines.  The following changes have been made:  start rosuvastatin  Labs/ tests ordered today include:   Orders Placed This Encounter  Procedures  . Exercise Tolerance Test  . EKG 12-Lead     Disposition:   FU with Everlynn Sagun C. Duke Salvia, MD, [facility] in 6 weeks.     This note was written with the assistance of speech recognition software.  Please excuse any transcriptional errors.  Signed, Hao Dion C. Duke Salvia, MD, [facility]  08/19/2016 12:00 PM     [facility]

## 2016-08-17 NOTE — Patient Instructions (Addendum)
Medication Instructions:  START AMLODIPINE 5 MG DAILY  START ROSUVASTATIN 20 MG DAILY   Labwork: FASTING LP/CMET WHEN YOU RETURN IN 6 WEEKS   Testing/Procedures: Your physician has requested that you have an exercise tolerance test. For further information please visit [email or web address] Please also follow instruction sheet, as given.  Follow-Up: Your physician recommends that you schedule a follow-up appointment in: 6 WEEKS   If you need a refill on your cardiac medications before your next appointment, please call your pharmacy.

## 2016-08-19 ENCOUNTER — Telehealth (HOSPITAL_COMMUNITY): Payer: Self-pay

## 2016-08-19 ENCOUNTER — Encounter: Payer: Self-pay | Admitting: Cardiovascular Disease

## 2016-08-19 DIAGNOSIS — E785 Hyperlipidemia, unspecified: Secondary | ICD-10-CM

## 2016-08-19 HISTORY — DX: Hyperlipidemia, unspecified: E78.5

## 2016-08-19 NOTE — Telephone Encounter (Signed)
Encounter complete. 

## 2016-08-24 ENCOUNTER — Ambulatory Visit (HOSPITAL_COMMUNITY)
Admission: RE | Admit: 2016-08-24 | Discharge: 2016-08-24 | Disposition: A | Discharge status: Home / Self Care | Payer: BLUE CROSS/BLUE SHIELD | Source: Ambulatory Visit | Attending: Cardiovascular Disease | Admitting: Cardiovascular Disease

## 2016-08-24 DIAGNOSIS — R0789 Other chest pain: Secondary | ICD-10-CM | POA: Diagnosis present

## 2016-08-24 DIAGNOSIS — R5383 Other fatigue: Secondary | ICD-10-CM | POA: Diagnosis not present

## 2016-10-05 ENCOUNTER — Ambulatory Visit: Payer: BLUE CROSS/BLUE SHIELD | Admitting: Cardiovascular Disease

## 2016-10-05 NOTE — Progress Notes (Signed)
Cardiology Office Note   Date:  10/06/2016   ID:  Berda, Shelvin 04/24/1955, MRN [id number]  PCP:  Dorothyann Peng, MD  Cardiologist:   Chilton Si, MD   Chief Complaint  Patient presents with  . Follow-up    pt reports fatigue    History of Present Illness: LAKERA VIALL is a 61 y.o. female with hypertension, diabetes, and CKD II here for follow up. She was seen 08/2016 for the evaluation of chest pain.  Her symptoms were atypical and seemed more consistent with GERD.  However, given her risk factors she was referred for an ETT.  She underwent stress testing on 08/24/16 that was low risk for ischemia.  She achieved 7 METS on a Bruce protocol.  At that appointment her ASCVD 10 year risk was greater than 10%, so she was started on rosuvastatin.  She has concerns about statins because her mother had a lot of side effects.  Since starting that medication she has noted cramping in bilateral legs. She is otherwise been feeling well and denies any chest pain or shortness of breath. She hasn't noted any lower extremity edema, orthopnea, or PND.  At her last appointment she was also started on amlodipine due to poorly-controlled blood pressure. She has not had any side effects of this medicine and is doing   Past Medical History:  Diagnosis Date  . Allergy   . Diabetes mellitus without complication (HCC)   . Hyperlipidemia 08/19/2016  . Hypertension     Past Surgical History:  Procedure Laterality Date  . ABDOMINAL HYSTERECTOMY    . CHOLECYSTECTOMY    . OVARIAN CYST REMOVAL       Current Outpatient Prescriptions  Medication Sig Dispense Refill  . amLODipine (NORVASC) 5 MG tablet Take 1 tablet (5 mg total) by mouth daily. 30 tablet 5  . cetirizine (ZYRTEC) 10 MG tablet Take 10 mg by mouth daily.    . Insulin Detemir (LEVEMIR FLEXTOUCH) 100 UNIT/ML Pen Inject 10 Units as directed at bedtime.    . metFORMIN (GLUCOPHAGE) 500 MG tablet Take by mouth 2 (two) times daily  with a meal.    . olmesartan-hydrochlorothiazide (BENICAR HCT) 40-25 MG tablet Take 1 tablet by mouth daily.    . rosuvastatin (CRESTOR) 20 MG tablet Take 20 mg by mouth as directed. 1/2 TABLET BY MOUTH ON Monday, Wednesday, AND Friday ONLY    . Vitamin D, Ergocalciferol, (DRISDOL) 50000 units CAPS capsule Take 1 capsule by mouth 2 (two) times a week. Tuesdays and Fridays  2   No current facility-administered medications for this visit.     Allergies:   Amoxicillin and Codeine    Social History:  The patient  reports that she has never smoked. She has never used smokeless tobacco.   Family History:  The patient's family history includes Alzheimer's disease in her father and maternal grandmother; Aneurysm in her mother; Atrial fibrillation in her brother and mother; Cancer in her paternal grandmother; Diabetes in her brother and father; Heart attack in her maternal grandfather and mother; Stroke in her brother and mother.    ROS:  Please see the history of present illness.   Otherwise, review of systems are positive for none.   All other systems are reviewed and negative.    PHYSICAL EXAM: VS:  BP 112/68   Pulse 88   Ht 5\' 5"  (1.651 m)   Wt 106.9 kg (235 lb 9.6 oz)   BMI 39.21 kg/m  , BMI  Body mass index is 39.21 kg/m. GENERAL:  Well appearing HEENT: Pupils equal round and reactive, fundi not visualized, oral mucosa unremarkable NECK:  No jugular venous distention, waveform within normal limits, carotid upstroke brisk and symmetric, no bruits, no thyromegaly LYMPHATICS:  No cervical adenopathy LUNGS:  Clear to auscultation bilaterally HEART:  RRR.  PMI not displaced or sustained,S1 and S2 within normal limits, no S3, no S4, no clicks, no rubs, no murmurs ABD:  Flat, positive bowel sounds normal in frequency in pitch, no bruits, no rebound, no guarding, no midline pulsatile mass, no hepatomegaly, no splenomegaly EXT:  2 plus pulses throughout, no edema, no cyanosis no clubbing SKIN:   No rashes no nodules NEURO:  Cranial nerves II through XII grossly intact, motor grossly intact throughout PSYCH:  Cognitively intact, oriented to person place and time   EKG:  EKG is ordered today. The ekg ordered today demonstrates Sinus rhythm. 87 bpm.  ETT 08/24/16: Upsloping ST segment depression ST segment depression of 0.5 mm was noted during stress in the III, aVF, V4, V5 and V6 leads, and returning to baseline after 1-5 minutes of recovery.  Exercise time 5:10. Fatigue. Non diagnostic ST changes  Duke treadmill score is 5. Overall low risk.  Recent Labs: No results found for requested labs within last 8760 hours.   09/21/16: Sodium 142, potassium 4.5, BUN 16, creatinine 0.8 AST 19, ALT 21 WBC 5.2, hemoglobin 12.9, hematocrit 40.6, platelets 281 Total cholesterol 105, triglycerides 137, HDL 35, LDL 43 Hemoglobin B4W 8.2% TSH 1.3, free T4 1.  06/30/16: Sodium 140, potassium 4.1, BUN 18, creatinine 0.85 AST 17, ALT 19 Total cholesterol 196, triglycerides 277, HDL 38, LDL 103  Lipid Panel No results found for: CHOL, TRIG, HDL, CHOLHDL, VLDL, LDLCALC, LDLDIRECT    Wt Readings from Last 3 Encounters:  10/06/16 106.9 kg (235 lb 9.6 oz)  08/17/16 104.9 kg (231 lb 3.2 oz)  04/23/15 109.7 kg (241 lb 12.8 oz)      ASSESSMENT AND PLAN:  # Atypcial chest pain: Resolved.  ETT was negative for ischemia.  # Hyperlipidemia: ASCVD 10 year Risk is 23.4%.  Continue aspirin 81 mg daily. LDL improved to 43 on 09/2016. However, she has myalgias. We will stop rosuvastatin for one week. She will then reduce it to 10 mg on Monday Wednesday Friday only. Repeat lipids in 2 months.  # Hypertension: Blood pressure is much better controlled after adding amlodipine. Continue amlodipine, losartan, and had orthopnea as above.   Current medicines are reviewed at length with the patient today.  The patient does not have concerns regarding medicines.  The following changes have been made:  start  rosuvastatin  Labs/ tests ordered today include:   Orders Placed This Encounter  Procedures  . Lipid panel     Disposition:   FU with Brynlie Daza C. Duke Salvia, MD, [facility] in 6 months.     This note was written with the assistance of speech recognition software.  Please excuse any transcriptional errors.  Signed, Meldon Hanzlik C. Duke Salvia, MD, [facility]  10/06/2016 8:54 AM    [facility]

## 2016-10-06 ENCOUNTER — Encounter: Payer: Self-pay | Admitting: Cardiovascular Disease

## 2016-10-06 ENCOUNTER — Ambulatory Visit (INDEPENDENT_AMBULATORY_CARE_PROVIDER_SITE_OTHER): Payer: BLUE CROSS/BLUE SHIELD | Admitting: Cardiovascular Disease

## 2016-10-06 VITALS — BP 112/68 | HR 88 | Ht 65.0 in | Wt 235.6 lb

## 2016-10-06 DIAGNOSIS — E7800 Pure hypercholesterolemia, unspecified: Secondary | ICD-10-CM

## 2016-10-06 DIAGNOSIS — R0789 Other chest pain: Secondary | ICD-10-CM

## 2016-10-06 DIAGNOSIS — I10 Essential (primary) hypertension: Secondary | ICD-10-CM

## 2016-10-06 NOTE — Patient Instructions (Signed)
Medication Instructions:  HOLD ROSUVASTATIN FOR 1 WEEK AND THEN RESUME AT 10 MG Monday, Wednesday, AND Friday ONLY  Labwork: FASTING LP FIRST WEEK OF November   Testing/Procedures: NONE  Follow-Up: Your physician wants you to follow-up in: 6 MONTH OV You will receive a reminder letter in the mail two months in advance. If you don't receive a letter, please call our office to schedule the follow-up appointment.  If you need a refill on your cardiac medications before your next appointment, please call your pharmacy.

## 2017-03-28 ENCOUNTER — Other Ambulatory Visit: Payer: Self-pay | Admitting: Cardiovascular Disease

## 2017-03-28 NOTE — Telephone Encounter (Signed)
Refill Request.  

## 2017-05-10 ENCOUNTER — Other Ambulatory Visit: Payer: Self-pay | Admitting: Orthopedic Surgery

## 2017-05-10 DIAGNOSIS — M4712 Other spondylosis with myelopathy, cervical region: Secondary | ICD-10-CM

## 2017-05-19 ENCOUNTER — Ambulatory Visit
Admission: RE | Admit: 2017-05-19 | Discharge: 2017-05-19 | Disposition: A | Discharge status: Home / Self Care | Payer: BLUE CROSS/BLUE SHIELD | Source: Ambulatory Visit | Attending: Orthopedic Surgery | Admitting: Orthopedic Surgery

## 2017-05-19 DIAGNOSIS — M4712 Other spondylosis with myelopathy, cervical region: Secondary | ICD-10-CM

## 2017-06-05 NOTE — Progress Notes (Signed)
Cardiology Office Note   Date:  06/06/2017   ID:  Ashley Monroe, Ashley Monroe 01/17/1956, MRN [id number]  PCP:  Dorothyann Peng, MD  Cardiologist:   Chilton Si, MD   No chief complaint on file.   History of Present Illness: Ashley Monroe is a 62 y.o. female with hypertension, diabetes, and CKD II here for follow up. She was seen 08/2016 for the evaluation of chest pain.  Her symptoms were atypical and seemed more consistent with GERD.  However, given her risk factors she was referred for an ETT.  She underwent stress testing on 08/24/16 that was low risk for ischemia.  She achieved 7 METS on a Bruce protocol.  At that appointment her ASCVD 10 year risk was greater than 10%, so she was started on rosuvastatin.  She has concerns about statins because her mother had a lot of side effects.  Since starting that medication she has noted cramping in bilateral legs. She is otherwise been feeling well and denies any chest pain or shortness of breath. She hasn't noted any lower extremity edema, orthopnea, or PND.  At her last appointment she was also started on amlodipine due to poorly-controlled blood pressure. She has not had any side effects of this medicine and is doing  At her last appointment Ashley Monroe had myalgias so rosuvastatin was held for one week.  Her symptoms did not improve.  She subsequently found out that her chest pain and arm pain and weakness were actually due to cervical disc disease.  She is scheduled to have decompression surgery and is here for risk assessment.  She has been feeling otherwise well.  She recently walked in an event for breast cancer and had no chest pain or shortness of breath with this activity.  She is able to walk up stairs without symptoms.  She has no lower extremity edema, orthopnea, or PND.   Past Medical History:  Diagnosis Date  . Allergy   . Diabetes mellitus without complication (HCC)   . Hyperlipidemia 08/19/2016  . Hypertension     Past  Surgical History:  Procedure Laterality Date  . ABDOMINAL HYSTERECTOMY    . CHOLECYSTECTOMY    . OVARIAN CYST REMOVAL       Current Outpatient Medications  Medication Sig Dispense Refill  . amLODipine (NORVASC) 5 MG tablet TAKE 1 TABLET BY MOUTH EVERY DAY 30 tablet 4  . aspirin EC 81 MG tablet Take 81 mg by mouth daily.    . cetirizine (ZYRTEC) 10 MG tablet Take 10 mg by mouth daily.    . Colchicine 0.6 MG CAPS Take 0.6 mg by mouth daily as needed. (for gout)  0  . cyclobenzaprine (FLEXERIL) 10 MG tablet Take 10 mg by mouth at bedtime as needed for muscle spasms.  0  . Insulin Detemir (LEVEMIR FLEXTOUCH) 100 UNIT/ML Pen Inject 10 Units as directed at bedtime.    . metFORMIN (GLUCOPHAGE) 500 MG tablet Take by mouth 2 (two) times daily with a meal.    . olmesartan-hydrochlorothiazide (BENICAR HCT) 40-25 MG tablet Take 1 tablet by mouth daily.    Marland Kitchen OZEMPIC 0.25 or 0.5 MG/DOSE SOPN Inject 0.5 mg into the skin once a week.    . rosuvastatin (CRESTOR) 20 MG tablet Take 1 tablet (20 mg total) by mouth every Monday, Wednesday, and Friday. 12 tablet 6  . Vitamin D, Ergocalciferol, (DRISDOL) 50000 units CAPS capsule Take 1 capsule by mouth 2 (two) times a week. Tuesdays and Fridays  2   No current facility-administered medications for this visit.     Allergies:   Amoxicillin and Codeine    Social History:  The patient  reports that she has never smoked. She has never used smokeless tobacco.   Family History:  The patient's family history includes Alzheimer's disease in her father and maternal grandmother; Aneurysm in her mother; Atrial fibrillation in her brother and mother; Cancer in her paternal grandmother; Diabetes in her brother and father; Heart attack in her maternal grandfather and mother; Stroke in her brother and mother.    ROS:  Please see the history of present illness.   Otherwise, review of systems are positive for none.   All other systems are reviewed and negative.     PHYSICAL EXAM: VS:  BP 136/86   Pulse 100   Ht 5\' 5"  (1.651 m)   Wt 223 lb 9.6 oz (101.4 kg)   BMI 37.21 kg/m   , BMI Body mass index is 37.21 kg/m. GENERAL:  Well appearing HEENT: Pupils equal round and reactive, fundi not visualized, oral mucosa unremarkable NECK:  No jugular venous distention, waveform within normal limits, carotid upstroke brisk and symmetric, no bruits LUNGS:  Clear to auscultation bilaterally HEART:  RRR.  PMI not displaced or sustained,S1 and S2 within normal limits, no S3, no S4, no clicks, no rubs, no murmurs ABD:  Flat, positive bowel sounds normal in frequency in pitch, no bruits, no rebound, no guarding, no midline pulsatile mass, no hepatomegaly, no splenomegaly EXT:  2 plus pulses throughout, no edema, no cyanosis no clubbing SKIN:  No rashes no nodules NEURO:  Cranial nerves II through XII grossly intact, motor grossly intact throughout PSYCH:  Cognitively intact, oriented to person place and time   EKG:  EKG is ordered today. The ekg ordered 08/17/16 demonstrates Sinus rhythm. 87 bpm. 06/06/17: Sinus rhythm.  Rate 100 bpm.  ETT 08/24/16: Upsloping ST segment depression ST segment depression of 0.5 mm was noted during stress in the III, aVF, V4, V5 and V6 leads, and returning to baseline after 1-5 minutes of recovery.  Exercise time 5:10. Fatigue. Non diagnostic ST changes  Duke treadmill score is 5. Overall low risk.  Recent Labs: No results found for requested labs within last 8760 hours.   09/21/16: Sodium 142, potassium 4.5, BUN 16, creatinine 0.8 AST 19, ALT 21 WBC 5.2, hemoglobin 12.9, hematocrit 40.6, platelets 281 Total cholesterol 105, triglycerides 137, HDL 35, LDL 43 Hemoglobin 09/23/16 8.2% TSH 1.3, free T4 1.  06/30/16: Sodium 140, potassium 4.1, BUN 18, creatinine 0.85 AST 17, ALT 19 Total cholesterol 196, triglycerides 277, HDL 38, LDL 103  Lipid Panel No results found for: CHOL, TRIG, HDL, CHOLHDL, VLDL, LDLCALC, LDLDIRECT     Wt Readings from Last 3 Encounters:  06/06/17 223 lb 9.6 oz (101.4 kg)  10/06/16 235 lb 9.6 oz (106.9 kg)  08/17/16 231 lb 3.2 oz (104.9 kg)      ASSESSMENT AND PLAN:  # Pre-surgical risk assessment: Ashley Monroe need spinal decompression surgery.  She is at low risk and can safely proceed with surgery.  She doe it is okay for her to hold aspirin for up to 5 days prior to surgery.s not need any additional testing.    # Atypcial chest pain: Resolved.  ETT was negative for ischemia.  # Hyperlipidemia: ASCVD 10 year risk is 23.4%.  Continue aspirin 81 mg daily and rosuvastatin. LDL was 43 on 09/2016.  # Hypertension: Blood pressure is above goal  but has been good with her PCP.  She will check at home and follow up.  Her goal is less than 130/80.   Current medicines are reviewed at length with the patient today.  The patient does not have concerns regarding medicines.  The following changes have been made:  start rosuvastatin  Labs/ tests ordered today include:   No orders of the defined types were placed in this encounter.    Disposition:   FU with Lydon Vansickle C. Duke Salvia, MD, [facility] in 6 months.     This note was written with the assistance of speech recognition software.  Please excuse any transcriptional errors.  Signed, Athziry Millican C. Duke Salvia, MD, [facility]  06/06/2017 4:34 PM    [facility]

## 2017-06-06 ENCOUNTER — Ambulatory Visit: Payer: BLUE CROSS/BLUE SHIELD | Admitting: Cardiovascular Disease

## 2017-06-06 ENCOUNTER — Encounter: Payer: Self-pay | Admitting: Cardiovascular Disease

## 2017-06-06 VITALS — BP 136/86 | HR 100 | Ht 65.0 in | Wt 223.6 lb

## 2017-06-06 DIAGNOSIS — Z01818 Encounter for other preprocedural examination: Secondary | ICD-10-CM

## 2017-06-06 DIAGNOSIS — I10 Essential (primary) hypertension: Secondary | ICD-10-CM

## 2017-06-06 DIAGNOSIS — E7800 Pure hypercholesterolemia, unspecified: Secondary | ICD-10-CM

## 2017-06-06 NOTE — Patient Instructions (Signed)
Medication Instructions:  Your physician recommends that you continue on your current medications as directed. Please refer to the Current Medication list given to you today.  Labwork: NONE  Testing/Procedures: NONE  Follow-Up: Your physician wants you to follow-up in: 6 MONTHS OV  You will receive a reminder letter in the mail two months in advance. If you don't receive a letter, please call our office to schedule the follow-up appointment.   Any Other Special Instructions Will Be Listed Below (If Applicable). MONITOR YOUR BLOOD PRESSURE AT HOME  YOU ARE CLEARED FOR SURGERY, OK TO HOLD ASPIRIN UP TO 5 DAYS PRIOR IF RECOMMENDED BY SURGEON   If you need a refill on your cardiac medications before your next appointment, please call your pharmacy.

## 2017-08-08 HISTORY — PX: CERVICAL SPINE SURGERY: SHX589

## 2017-08-23 DIAGNOSIS — G992 Myelopathy in diseases classified elsewhere: Secondary | ICD-10-CM | POA: Insufficient documentation

## 2017-09-28 LAB — LIPID PANEL
CHOL/HDL RATIO: 4.7 ratio — AB (ref 0.0–4.4)
Cholesterol, Total: 170 mg/dL (ref 100–199)
HDL: 36 mg/dL — AB (ref >39)
LDL Calculated: 90 mg/dL (ref 0–99)
Triglycerides: 218 mg/dL — ABNORMAL HIGH (ref 0–149)
VLDL CHOLESTEROL CAL: 44 mg/dL — AB (ref 5–40)

## 2017-10-03 LAB — HM DIABETES EYE EXAM

## 2017-10-06 ENCOUNTER — Telehealth: Payer: Self-pay | Admitting: Cardiovascular Disease

## 2017-10-06 DIAGNOSIS — Z5181 Encounter for therapeutic drug level monitoring: Secondary | ICD-10-CM

## 2017-10-06 DIAGNOSIS — E7800 Pure hypercholesterolemia, unspecified: Secondary | ICD-10-CM

## 2017-10-06 DIAGNOSIS — I10 Essential (primary) hypertension: Secondary | ICD-10-CM

## 2017-10-06 NOTE — Telephone Encounter (Addendum)
Spoke with patient and confirmed she is taking Rosuvastatin. Will forward to Dr Duke Salvia for review Will call back if any further recommendations

## 2017-10-06 NOTE — Telephone Encounter (Signed)
-----   Message from Ashley Si, MD sent at 10/04/2017  2:36 PM EDT ----- Cholesterol levels are OK but not as good as last year.  Is she taking rosuvastatin?  Continue rosuvastatin but also 150 min of exercise and watch diet.

## 2017-10-06 NOTE — Telephone Encounter (Signed)
Follow Up:    Returning your call from today.concerning her lab results.

## 2017-10-13 MED ORDER — EZETIMIBE 10 MG PO TABS: 10.0000 mg | ORAL_TABLET | Freq: Every day | ORAL | 3 refills | Status: DC

## 2017-10-13 NOTE — Telephone Encounter (Signed)
Advised patient of lab results and medication changes. Mailed lab orders for recheck in 3 months.   Notes recorded by Chilton Si, MD on 10/12/2017 at 5:30 PM EDT Okay. Let us add Zetia 10 mg daily. Repeat lipids and CMP in 3 months

## 2017-10-20 ENCOUNTER — Other Ambulatory Visit: Payer: Self-pay | Admitting: Cardiovascular Disease

## 2017-12-20 ENCOUNTER — Encounter: Payer: Self-pay | Admitting: Internal Medicine

## 2017-12-20 ENCOUNTER — Ambulatory Visit (INDEPENDENT_AMBULATORY_CARE_PROVIDER_SITE_OTHER): Payer: BLUE CROSS/BLUE SHIELD | Admitting: Internal Medicine

## 2017-12-20 VITALS — BP 110/68 | HR 87 | Temp 98.5°F | Ht 64.5 in | Wt 228.4 lb

## 2017-12-20 DIAGNOSIS — N182 Chronic kidney disease, stage 2 (mild): Secondary | ICD-10-CM | POA: Diagnosis not present

## 2017-12-20 DIAGNOSIS — I129 Hypertensive chronic kidney disease with stage 1 through stage 4 chronic kidney disease, or unspecified chronic kidney disease: Secondary | ICD-10-CM | POA: Diagnosis not present

## 2017-12-20 DIAGNOSIS — E1122 Type 2 diabetes mellitus with diabetic chronic kidney disease: Secondary | ICD-10-CM

## 2017-12-20 DIAGNOSIS — Z0000 Encounter for general adult medical examination without abnormal findings: Secondary | ICD-10-CM | POA: Diagnosis not present

## 2017-12-20 DIAGNOSIS — Z7982 Long term (current) use of aspirin: Secondary | ICD-10-CM

## 2017-12-20 LAB — POCT UA - MICROALBUMIN
Creatinine, POC: 300 mg/dL
Microalbumin Ur, POC: 150 mg/L

## 2017-12-20 LAB — POCT URINALYSIS DIPSTICK
Bilirubin, UA: NEGATIVE
Glucose, UA: NEGATIVE
KETONES UA: NEGATIVE
NITRITE UA: NEGATIVE
PH UA: 5 (ref 5.0–8.0)
PROTEIN UA: POSITIVE — AB
Spec Grav, UA: 1.025 (ref 1.010–1.025)
Urobilinogen, UA: 0.2 E.U./dL

## 2017-12-20 NOTE — Progress Notes (Signed)
Subjective:     Patient ID: Ashley Monroe , female    DOB: 08-23-1955 , 62 y.o.   MRN: [id number]   Chief Complaint  Patient presents with  . Annual Exam  . Diabetes  . Hypertension    HPI  SHE IS HERE TODAY FOR A FULL PHYSICAL EXAM. SHE IS FOLLOWED BY GYN FOR HER PELVIC EXAMINATIONS.   Diabetes  She presents for her follow-up diabetic visit. She has type 2 diabetes mellitus. Her disease course has been stable. There are no hypoglycemic associated symptoms. Pertinent negatives for hypoglycemia include no sweats. There are no diabetic associated symptoms. Pertinent negatives for diabetes include no blurred vision. There are no hypoglycemic complications. Diabetic complications include nephropathy. Risk factors for coronary artery disease include diabetes mellitus, hypertension, sedentary lifestyle, post-menopausal, obesity and dyslipidemia.  Hypertension  This is a chronic problem. The current episode started more than 1 year ago. The problem is controlled. Pertinent negatives include no blurred vision, orthopnea, palpitations, shortness of breath or sweats.     Past Medical History:  Diagnosis Date  . Allergy   . Diabetes mellitus without complication ([place])   . Hyperlipidemia 08/19/2016  . Hypertension      Family History  Problem Relation Age of Onset  . Atrial fibrillation Mother   . Stroke Mother   . Heart attack Mother   . Aneurysm Mother   . Atrial fibrillation Brother   . Stroke Brother   . Diabetes Father   . Alzheimer's disease Father   . Diabetes Brother   . Alzheimer's disease Maternal Grandmother   . Heart attack Maternal Grandfather   . Cancer Paternal Grandmother      Current Outpatient Medications:  .  amLODipine (NORVASC) 5 MG tablet, TAKE 1 TABLET BY MOUTH EVERY DAY, Disp: 90 tablet, Rfl: 1 .  aspirin EC 81 MG tablet, Take 81 mg by mouth daily., Disp: , Rfl:  .  cetirizine (ZYRTEC) 10 MG tablet, Take 10 mg by mouth daily., Disp: , Rfl:  .   Colchicine 0.6 MG CAPS, Take 0.6 mg by mouth daily as needed. (for gout), Disp: , Rfl: 0 .  cyclobenzaprine (FLEXERIL) 10 MG tablet, Take 10 mg by mouth at bedtime as needed for muscle spasms., Disp: , Rfl: 0 .  ezetimibe (ZETIA) 10 MG tablet, Take 1 tablet (10 mg total) by mouth daily., Disp: 90 tablet, Rfl: 3 .  Insulin Detemir (LEVEMIR FLEXTOUCH) 100 UNIT/ML Pen, Inject 10 Units as directed at bedtime., Disp: , Rfl:  .  metFORMIN (GLUCOPHAGE) 500 MG tablet, Take by mouth 2 (two) times daily with a meal., Disp: , Rfl:  .  olmesartan-hydrochlorothiazide (BENICAR HCT) 40-25 MG tablet, Take 1 tablet by mouth daily., Disp: , Rfl:  .  OZEMPIC 0.25 or 0.5 MG/DOSE SOPN, Inject 0.5 mg into the skin once a week., Disp: , Rfl:  .  rosuvastatin (CRESTOR) 20 MG tablet, Take 1 tablet (20 mg total) by mouth every Monday, Wednesday, and Friday., Disp: 12 tablet, Rfl: 6 .  Vitamin D, Ergocalciferol, (DRISDOL) 50000 units CAPS capsule, Take 1 capsule by mouth 2 (two) times a week. Tuesdays and Fridays, Disp: , Rfl: 2   Allergies  Allergen Reactions  . Amoxicillin Hives  . Codeine Hives     Review of Systems  Constitutional: Negative.   HENT: Negative.   Eyes: Negative.  Negative for blurred vision.  Respiratory: Negative.  Negative for shortness of breath.   Cardiovascular: Negative.  Negative for palpitations and orthopnea.  Gastrointestinal: Negative.   Endocrine: Negative.   Genitourinary: Negative.   Musculoskeletal: Negative.   Skin: Negative.   Allergic/Immunologic: Negative.   Neurological: Negative.   Hematological: Negative.   Psychiatric/Behavioral: Negative.      Today's Vitals   12/20/17 0928  BP: 110/68  Pulse: 87  Temp: 98.5 F (36.9 C)  TempSrc: Oral  Weight: 228 lb 6.4 oz (103.6 kg)  Height: 5' 4.5" (1.638 m)   Body mass index is 38.6 kg/m.   Objective:  Physical Exam  Constitutional: She is oriented to person, place, and time. She appears well-developed and  well-nourished.  HENT:  Head: Normocephalic and atraumatic.  Right Ear: External ear normal.  Left Ear: External ear normal.  Nose: Nose normal.  Mouth/Throat: Oropharynx is clear and moist.  Cardiovascular: Normal rate, regular rhythm, normal heart sounds and intact distal pulses.  Pulses:      Dorsalis pedis pulses are 3+ on the right side, and 3+ on the left side.  Pulmonary/Chest: Effort normal and breath sounds normal. Right breast exhibits no inverted nipple, no mass, no nipple discharge, no skin change and no tenderness. Left breast exhibits no inverted nipple, no mass, no nipple discharge, no skin change and no tenderness.  Genitourinary:  Genitourinary Comments: deferred  Musculoskeletal: Normal range of motion.       Right foot: There is normal range of motion and no deformity.       Left foot: There is normal range of motion and no deformity.  Feet:  Right Foot:  Protective Sensation: 5 sites tested. 5 sites sensed.  Left Foot:  Protective Sensation: 5 sites tested. 5 sites sensed.  Neurological: She is alert and oriented to person, place, and time.  Skin: Skin is warm and dry.  Nursing note and vitals reviewed.       Assessment And Plan:     1. Routine general medical examination at health care facility  A full exam was performed. Importance of monthly self breast exams were discussed with the patient. She is up to date with colonoscopy, mammogram. I will request her mammogram results from her GYN, Dr. Garwin Brothers.  PATIENT HAS BEEN ADVISED TO GET 30-45 MINUTES REGULAR EXERCISE NO LESS THAN FOUR TO FIVE DAYS PER WEEK - BOTH WEIGHTBEARING EXERCISES AND AEROBIC ARE RECOMMENDED.  SHE IS ADVISED TO FOLLOW A HEALTHY DIET WITH AT LEAST SIX FRUITS/VEGGIES PER DAY, DECREASE INTAKE OF RED MEAT, AND TO INCREASE FISH INTAKE TO TWO DAYS PER WEEK.  MEATS/FISH SHOULD NOT BE FRIED, BAKED OR BROILED IS PREFERABLE.  I SUGGEST WEARING SPF 50 SUNSCREEN ON EXPOSED PARTS AND ESPECIALLY WHEN IN THE  DIRECT SUNLIGHT FOR AN EXTENDED PERIOD OF TIME.  PLEASE AVOID FAST FOOD RESTAURANTS AND INCREASE YOUR WATER INTAKE.  - CMP14+EGFR - CBC - Lipid panel - Hemoglobin A1c - Vitamin B12  2. Type 2 diabetes mellitus with stage 2 chronic kidney disease, without long-term current use of insulin ([place])  Diabetic foot exam was performed. I will request her 2019 ey exam from Dr. Bing Plume. She will rto in 4 months for re-evaluation. She is encouraged to incorporate more exercise into her daily routine. I DISCUSSED WITH THE PATIENT AT LENGTH REGARDING THE GOALS OF GLYCEMIC CONTROL AND POSSIBLE LONG-TERM COMPLICATIONS.  I  ALSO STRESSED THE IMPORTANCE OF COMPLIANCE WITH HOME GLUCOSE MONITORING, DIETARY RESTRICTIONS INCLUDING AVOIDANCE OF SUGARY DRINKS/PROCESSED FOODS,  ALONG WITH REGULAR EXERCISE.  I  ALSO STRESSED THE IMPORTANCE OF ANNUAL EYE EXAMS, SELF FOOT CARE AND COMPLIANCE WITH OFFICE  VISITS.  3. Chronic renal disease, stage II  Chronic. I will check a GFR, Cr today. She is encouraged to stay well hydrated.   4. Hypertensive nephropathy  Well controlled. She will continue with current meds. She is encouraged to avoid adding salt to her foods. She will rto in six months for re-evaluation.   - EKG 12-Lead        Maximino Greenland, MD

## 2017-12-20 NOTE — Patient Instructions (Signed)

## 2017-12-21 LAB — CMP14+EGFR
ALT: 23 IU/L (ref 0–32)
AST: 17 IU/L (ref 0–40)
Albumin/Globulin Ratio: 1.6 (ref 1.2–2.2)
Albumin: 4.2 g/dL (ref 3.6–4.8)
Alkaline Phosphatase: 115 IU/L (ref 39–117)
BILIRUBIN TOTAL: 0.4 mg/dL (ref 0.0–1.2)
BUN/Creatinine Ratio: 17 (ref 12–28)
BUN: 16 mg/dL (ref 8–27)
CHLORIDE: 105 mmol/L (ref 96–106)
CO2: 24 mmol/L (ref 20–29)
Calcium: 10 mg/dL (ref 8.7–10.3)
Creatinine, Ser: 0.96 mg/dL (ref 0.57–1.00)
GFR calc Af Amer: 74 mL/min/{1.73_m2} (ref >59)
GFR, EST NON AFRICAN AMERICAN: 64 mL/min/{1.73_m2} (ref >59)
GLOBULIN, TOTAL: 2.6 g/dL (ref 1.5–4.5)
Glucose: 135 mg/dL — ABNORMAL HIGH (ref 65–99)
POTASSIUM: 3.9 mmol/L (ref 3.5–5.2)
SODIUM: 144 mmol/L (ref 134–144)
Total Protein: 6.8 g/dL (ref 6.0–8.5)

## 2017-12-21 LAB — CBC
Hematocrit: 39.4 % (ref 34.0–46.6)
Hemoglobin: 13.1 g/dL (ref 11.1–15.9)
MCH: 26.8 pg (ref 26.6–33.0)
MCHC: 33.2 g/dL (ref 31.5–35.7)
MCV: 81 fL (ref 79–97)
Platelets: 281 10*3/uL (ref 150–450)
RBC: 4.88 x10E6/uL (ref 3.77–5.28)
RDW: 14.1 % (ref 12.3–15.4)
WBC: 5.6 10*3/uL (ref 3.4–10.8)

## 2017-12-21 LAB — HEMOGLOBIN A1C
ESTIMATED AVERAGE GLUCOSE: 154 mg/dL
HEMOGLOBIN A1C: 7 % — AB (ref 4.8–5.6)

## 2017-12-21 LAB — LIPID PANEL
CHOLESTEROL TOTAL: 105 mg/dL (ref 100–199)
Chol/HDL Ratio: 2.6 ratio (ref 0.0–4.4)
HDL: 41 mg/dL (ref >39)
LDL CALC: 40 mg/dL (ref 0–99)
Triglycerides: 121 mg/dL (ref 0–149)
VLDL Cholesterol Cal: 24 mg/dL (ref 5–40)

## 2017-12-21 LAB — VITAMIN B12: Vitamin B-12: 712 pg/mL (ref 232–1245)

## 2017-12-25 ENCOUNTER — Encounter: Payer: Self-pay | Admitting: Internal Medicine

## 2018-01-11 LAB — COMPREHENSIVE METABOLIC PANEL
ALT: 20 IU/L (ref 0–32)
AST: 19 IU/L (ref 0–40)
Albumin/Globulin Ratio: 2 (ref 1.2–2.2)
Albumin: 4.7 g/dL (ref 3.6–4.8)
Alkaline Phosphatase: 115 IU/L (ref 39–117)
BUN/Creatinine Ratio: 23 (ref 12–28)
BUN: 24 mg/dL (ref 8–27)
Bilirubin Total: 0.4 mg/dL (ref 0.0–1.2)
CO2: 25 mmol/L (ref 20–29)
CREATININE: 1.05 mg/dL — AB (ref 0.57–1.00)
Calcium: 10.2 mg/dL (ref 8.7–10.3)
Chloride: 100 mmol/L (ref 96–106)
GFR calc Af Amer: 67 mL/min/{1.73_m2} (ref >59)
GFR calc non Af Amer: 58 mL/min/{1.73_m2} — ABNORMAL LOW (ref >59)
GLOBULIN, TOTAL: 2.3 g/dL (ref 1.5–4.5)
GLUCOSE: 150 mg/dL — AB (ref 65–99)
Potassium: 3.8 mmol/L (ref 3.5–5.2)
SODIUM: 140 mmol/L (ref 134–144)
Total Protein: 7 g/dL (ref 6.0–8.5)

## 2018-01-11 LAB — LIPID PANEL
CHOLESTEROL TOTAL: 115 mg/dL (ref 100–199)
Chol/HDL Ratio: 3 ratio (ref 0.0–4.4)
HDL: 38 mg/dL — ABNORMAL LOW (ref >39)
LDL Calculated: 45 mg/dL (ref 0–99)
Triglycerides: 160 mg/dL — ABNORMAL HIGH (ref 0–149)
VLDL CHOLESTEROL CAL: 32 mg/dL (ref 5–40)

## 2018-04-19 ENCOUNTER — Encounter: Payer: Self-pay | Admitting: Internal Medicine

## 2018-04-19 ENCOUNTER — Ambulatory Visit: Payer: BLUE CROSS/BLUE SHIELD | Admitting: Internal Medicine

## 2018-04-19 ENCOUNTER — Other Ambulatory Visit: Payer: Self-pay

## 2018-04-19 VITALS — BP 146/98 | HR 85 | Temp 98.5°F | Ht 65.0 in | Wt 230.8 lb

## 2018-04-19 DIAGNOSIS — Z794 Long term (current) use of insulin: Secondary | ICD-10-CM

## 2018-04-19 DIAGNOSIS — Z6838 Body mass index (BMI) 38.0-38.9, adult: Secondary | ICD-10-CM

## 2018-04-19 DIAGNOSIS — I129 Hypertensive chronic kidney disease with stage 1 through stage 4 chronic kidney disease, or unspecified chronic kidney disease: Secondary | ICD-10-CM

## 2018-04-19 DIAGNOSIS — E1122 Type 2 diabetes mellitus with diabetic chronic kidney disease: Secondary | ICD-10-CM

## 2018-04-19 DIAGNOSIS — N182 Chronic kidney disease, stage 2 (mild): Secondary | ICD-10-CM

## 2018-04-19 DIAGNOSIS — M2550 Pain in unspecified joint: Secondary | ICD-10-CM

## 2018-04-19 MED ORDER — TRAMADOL HCL 50 MG PO TABS: 50.0000 mg | ORAL_TABLET | Freq: Four times a day (QID) | ORAL | 0 refills | Status: AC | PRN

## 2018-04-19 NOTE — Patient Instructions (Signed)
Diabetes Mellitus and Exercise Exercising regularly is important for your overall health, especially when you have diabetes (diabetes mellitus). Exercising is not only about losing weight. It has many other health benefits, such as increasing muscle strength and bone density and reducing body fat and stress. This leads to improved fitness, flexibility, and endurance, all of which result in better overall health. Exercise has additional benefits for people with diabetes, including:  Reducing appetite.  Helping to lower and control blood glucose.  Lowering blood pressure.  Helping to control amounts of fatty substances (lipids) in the blood, such as cholesterol and triglycerides.  Helping the body to respond better to insulin (improving insulin sensitivity).  Reducing how much insulin the body needs.  Decreasing the risk for heart disease by: ? Lowering cholesterol and triglyceride levels. ? Increasing the levels of good cholesterol. ? Lowering blood glucose levels. What is my activity plan? Your health care provider or certified diabetes educator can help you make a plan for the type and frequency of exercise (activity plan) that works for you. Make sure that you:  Do at least 150 minutes of moderate-intensity or vigorous-intensity exercise each week. This could be brisk walking, biking, or water aerobics. ? Do stretching and strength exercises, such as yoga or weightlifting, at least 2 times a week. ? Spread out your activity over at least 3 days of the week.  Get some form of physical activity every day. ? Do not go more than 2 days in a row without some kind of physical activity. ? Avoid being inactive for more than 30 minutes at a time. Take frequent breaks to walk or stretch.  Choose a type of exercise or activity that you enjoy, and set realistic goals.  Start slowly, and gradually increase the intensity of your exercise over time. What do I need to know about managing my  diabetes?   Check your blood glucose before and after exercising. ? If your blood glucose is 240 mg/dL (13.3 mmol/L) or higher before you exercise, check your urine for ketones. If you have ketones in your urine, do not exercise until your blood glucose returns to normal. ? If your blood glucose is 100 mg/dL (5.6 mmol/L) or lower, eat a snack containing 15-20 grams of carbohydrate. Check your blood glucose 15 minutes after the snack to make sure that your level is above 100 mg/dL (5.6 mmol/L) before you start your exercise.  Know the symptoms of low blood glucose (hypoglycemia) and how to treat it. Your risk for hypoglycemia increases during and after exercise. Common symptoms of hypoglycemia can include: ? Hunger. ? Anxiety. ? Sweating and feeling clammy. ? Confusion. ? Dizziness or feeling light-headed. ? Increased heart rate or palpitations. ? Blurry vision. ? Tingling or numbness around the mouth, lips, or tongue. ? Tremors or shakes. ? Irritability.  Keep a rapid-acting carbohydrate snack available before, during, and after exercise to help prevent or treat hypoglycemia.  Avoid injecting insulin into areas of the body that are going to be exercised. For example, avoid injecting insulin into: ? The arms, when playing tennis. ? The legs, when jogging.  Keep records of your exercise habits. Doing this can help you and your health care provider adjust your diabetes management plan as needed. Write down: ? Food that you eat before and after you exercise. ? Blood glucose levels before and after you exercise. ? The type and amount of exercise you have done. ? When your insulin is expected to peak, if you use   insulin. Avoid exercising at times when your insulin is peaking.  When you start a new exercise or activity, work with your health care provider to make sure the activity is safe for you, and to adjust your insulin, medicines, or food intake as needed.  Drink plenty of water while  you exercise to prevent dehydration or heat stroke. Drink enough fluid to keep your urine clear or pale yellow. Summary  Exercising regularly is important for your overall health, especially when you have diabetes (diabetes mellitus).  Exercising has many health benefits, such as increasing muscle strength and bone density and reducing body fat and stress.  Your health care provider or certified diabetes educator can help you make a plan for the type and frequency of exercise (activity plan) that works for you.  When you start a new exercise or activity, work with your health care provider to make sure the activity is safe for you, and to adjust your insulin, medicines, or food intake as needed. This information is not intended to replace advice given to you by your health care provider. Make sure you discuss any questions you have with your health care provider. Document Released: 04/17/2003 Document Revised: 08/05/2016 Document Reviewed: 07/07/2015 Elsevier Interactive Patient Education  2019 [employer]  

## 2018-04-19 NOTE — Progress Notes (Signed)
Subjective:     Patient ID: Ashley Monroe , female    DOB: February 25, 1955 , 63 y.o.   MRN: [id number]   Chief Complaint  Patient presents with  . Diabetes  . Hypertension    HPI  Diabetes  She presents for her follow-up diabetic visit. She has type 2 diabetes mellitus. Her disease course has been stable. There are no hypoglycemic associated symptoms. Pertinent negatives for diabetes include no blurred vision and no chest pain. There are no hypoglycemic complications. Risk factors for coronary artery disease include diabetes mellitus, dyslipidemia, hypertension, obesity and sedentary lifestyle. She is following a generally healthy diet. Her breakfast blood glucose is taken between 7-8 am. Her breakfast blood glucose range is generally 130-140 mg/dl.  Hypertension  This is a chronic problem. The current episode started more than 1 year ago. The problem has been gradually improving since onset. The problem is controlled. Pertinent negatives include no blurred vision, chest pain, palpitations or shortness of breath.   Reports compliance with meds.   Past Medical History:  Diagnosis Date  . Allergy   . Diabetes mellitus without complication (HCC)   . Hyperlipidemia 08/19/2016  . Hypertension      Family History  Problem Relation Age of Onset  . Atrial fibrillation Mother   . Stroke Mother   . Heart attack Mother   . Aneurysm Mother   . Atrial fibrillation Brother   . Stroke Brother   . Diabetes Father   . Alzheimer's disease Father   . Diabetes Brother   . Alzheimer's disease Maternal Grandmother   . Heart attack Maternal Grandfather   . Cancer Paternal Grandmother      Current Outpatient Medications:  .  Insulin Detemir (LEVEMIR FLEXTOUCH) 100 UNIT/ML Pen, Inject 10 Units as directed at bedtime., Disp: , Rfl:  .  amLODipine (NORVASC) 5 MG tablet, TAKE 1 TABLET BY MOUTH EVERY DAY (Patient not taking: Reported on 04/19/2018), Disp: 90 tablet, Rfl: 1 .  aspirin EC 81 MG  tablet, Take 81 mg by mouth daily., Disp: , Rfl:  .  cetirizine (ZYRTEC) 10 MG tablet, Take 10 mg by mouth daily., Disp: , Rfl:  .  Colchicine 0.6 MG CAPS, Take 0.6 mg by mouth daily as needed. (for gout), Disp: , Rfl: 0 .  cyclobenzaprine (FLEXERIL) 10 MG tablet, Take 10 mg by mouth at bedtime as needed for muscle spasms., Disp: , Rfl: 0 .  ezetimibe (ZETIA) 10 MG tablet, Take 1 tablet (10 mg total) by mouth daily. (Patient not taking: Reported on 04/19/2018), Disp: 90 tablet, Rfl: 3 .  metFORMIN (GLUCOPHAGE) 500 MG tablet, Take by mouth 2 (two) times daily with a meal., Disp: , Rfl:  .  olmesartan-hydrochlorothiazide (BENICAR HCT) 40-25 MG tablet, Take 1 tablet by mouth daily., Disp: , Rfl:  .  OZEMPIC 0.25 or 0.5 MG/DOSE SOPN, Inject 0.5 mg into the skin once a week., Disp: , Rfl:  .  rosuvastatin (CRESTOR) 20 MG tablet, Take 1 tablet (20 mg total) by mouth every Monday, Wednesday, and Friday. (Patient not taking: Reported on 04/19/2018), Disp: 12 tablet, Rfl: 6 .  Vitamin D, Ergocalciferol, (DRISDOL) 50000 units CAPS capsule, Take 1 capsule by mouth 2 (two) times a week. Tuesdays and Fridays, Disp: , Rfl: 2   Allergies  Allergen Reactions  . Amoxicillin Hives  . Codeine Hives     Review of Systems  Constitutional: Negative.   Eyes: Negative for blurred vision.  Respiratory: Negative.  Negative for shortness of breath.  Cardiovascular: Negative for chest pain and palpitations.  Gastrointestinal: Negative.   Musculoskeletal: Positive for arthralgias (she c/o generalized joint pains. she is unable to determine triggers. also with hand stiffness.).  Neurological: Negative.   Psychiatric/Behavioral: Negative.      Today's Vitals   04/19/18 0853  BP: (!) 146/98  Pulse: 85  Temp: 98.5 F (36.9 C)  TempSrc: Oral  Weight: 230 lb 12.8 oz (104.7 kg)  Height: 5\' 5"  (1.651 m)   Body mass index is 38.41 kg/m.   Objective:  Physical Exam Vitals signs and nursing note reviewed.   Constitutional:      Appearance: Normal appearance.  HENT:     Head: Normocephalic and atraumatic.  Cardiovascular:     Rate and Rhythm: Normal rate and regular rhythm.     Heart sounds: Normal heart sounds.  Pulmonary:     Effort: Pulmonary effort is normal.     Breath sounds: Normal breath sounds.  Musculoskeletal:     Comments: Neg squeeze test  Skin:    General: Skin is warm.  Neurological:     General: No focal deficit present.     Mental Status: She is alert.  Psychiatric:        Mood and Affect: Mood normal.        Behavior: Behavior normal.         Assessment And Plan:     1. Type 2 diabetes mellitus with stage 2 chronic kidney disease, without long-term current use of insulin (HCC)   I will check CMP, Hba1c today. She is encouraged to avoid sugary beverages.   2. Hypertensive nephropathy  Uncontrolled. She will continue with current meds for now. She is encouraged to avoid adding salt to her foods. Today's elevation is likely due to her joint pains.   3. Arthralgia, unspecified joint  I will check an arthritis panel. She is also encouraged to avoid diet beverages.   4. Class 2 severe obesity due to excess calories with serious comorbidity and body mass index (BMI) of 38.0 to 38.9 in adult [facility])  Importance of achieving optimal weight to decrease risk of cardiovascular disease and cancers was discussed with the patient in full detail. She is encouraged to start slowly - start with 10 minutes twice daily at least three to four days per week and to gradually build to 30 minutes five days weekly. She was given tips to incorporate more activity into her daily routine - take stairs when possible, park farther away from her job, grocery stores, etc.   [name], MD

## 2018-04-21 LAB — BMP8+EGFR
BUN/Creatinine Ratio: 16 (ref 12–28)
BUN: 14 mg/dL (ref 8–27)
CO2: 23 mmol/L (ref 20–29)
Calcium: 10.1 mg/dL (ref 8.7–10.3)
Chloride: 104 mmol/L (ref 96–106)
Creatinine, Ser: 0.87 mg/dL (ref 0.57–1.00)
GFR calc Af Amer: 83 mL/min/{1.73_m2} (ref >59)
GFR calc non Af Amer: 72 mL/min/{1.73_m2} (ref >59)
Glucose: 175 mg/dL — ABNORMAL HIGH (ref 65–99)
Potassium: 4.7 mmol/L (ref 3.5–5.2)
Sodium: 146 mmol/L — ABNORMAL HIGH (ref 134–144)

## 2018-04-21 LAB — URIC ACID: Uric Acid: 7.4 mg/dL — ABNORMAL HIGH (ref 2.5–7.1)

## 2018-04-21 LAB — SEDIMENTATION RATE: Sed Rate: 57 mm/hr — ABNORMAL HIGH (ref 0–40)

## 2018-04-21 LAB — RHEUMATOID FACTOR: Rheumatoid fact SerPl-aCnc: 17.7 IU/mL — ABNORMAL HIGH (ref 0.0–13.9)

## 2018-04-21 LAB — HEMOGLOBIN A1C
Est. average glucose Bld gHb Est-mCnc: 180 mg/dL
Hgb A1c MFr Bld: 7.9 % — ABNORMAL HIGH (ref 4.8–5.6)

## 2018-04-21 LAB — ANTINUCLEAR ANTIBODIES, IFA: ANA Titer 1: NEGATIVE

## 2018-04-21 LAB — CYCLIC CITRUL PEPTIDE ANTIBODY, IGG/IGA: Cyclic Citrullin Peptide Ab: 8 units (ref 0–19)

## 2018-04-24 ENCOUNTER — Telehealth: Payer: Self-pay | Admitting: Internal Medicine

## 2018-04-24 NOTE — Telephone Encounter (Signed)
PT CAME IN STATED THAT SHE WOULD LIKE TO BE REFERRED TO RHEUMATOLOGY FOR FURTHER EVALUATION.

## 2018-04-25 NOTE — Telephone Encounter (Signed)
Please place referral to Dr. Corliss Skains. You should be able to use previous diagnosis of joint pains

## 2018-04-26 ENCOUNTER — Other Ambulatory Visit: Payer: Self-pay

## 2018-04-26 DIAGNOSIS — M2550 Pain in unspecified joint: Secondary | ICD-10-CM

## 2018-04-26 NOTE — Telephone Encounter (Signed)
Referral placed.

## 2018-05-12 ENCOUNTER — Encounter: Payer: Self-pay | Admitting: *Deleted

## 2018-06-28 NOTE — Progress Notes (Signed)
Office Visit Note  Patient: Ashley Monroe             Date of Birth: [birth year]           MRN: [id number]             PCP: Dorothyann PengSanders, Robyn, MD Referring: Dorothyann PengSanders, Robyn, MD Visit Date: 07/12/2018 Occupation: Engineer, structuralparts packer  Subjective:. Pain in multiple joints  History of Present Illness: Ashley Monroe is a 63 y.o. female seen in consultation per request of her PCP.  According to patient her symptoms started about 20 years ago with pain in her right arm and neck.  Symptoms gradually got worse and in 2018 she underwent C-spine fusion by Dr. Shon BatonBrooks.  She is also had lower back pain which is been increased recently and she has difficulty getting up from the chair.  She states for the last 2 years she has been having off-and-on pain in her right or left great toe with swelling.  She has been seen by podiatrist who gave her couple of injections and tested her labs which were positive for hyperuricemia.  She has been taking colchicine as needed.  The last flare was in 2018.  She states for the last few months she has been having increased pain in her hands.  To the point she has difficulty chopping with a knife.  She feels a stiff nurse in her hands.  She also has discomfort in her bilateral knee joints which she describes  mostly in the popliteal region.  She has chronic discomfort in her feet which she relates to gout.  Activities of Daily Living:  Patient reports morning stiffness for 30 minutes.   Patient Denies nocturnal pain.  Difficulty dressing/grooming: Denies Difficulty climbing stairs: Reports Difficulty getting out of chair: Reports Difficulty using hands for taps, buttons, cutlery, and/or writing: Reports  Review of Systems  Constitutional: Positive for fatigue. Negative for night sweats, weight gain and weight loss.  HENT: Negative for mouth sores, trouble swallowing, trouble swallowing, mouth dryness and nose dryness.   Eyes: Positive for dryness. Negative for pain,  redness and visual disturbance.  Respiratory: Negative for cough, shortness of breath and difficulty breathing.   Cardiovascular: Negative for chest pain, palpitations, hypertension, irregular heartbeat and swelling in legs/feet.  Gastrointestinal: Negative for blood in stool, constipation and diarrhea.  Endocrine: Negative for increased urination.  Genitourinary: Negative for vaginal dryness.  Musculoskeletal: Positive for arthralgias, joint pain, joint swelling, myalgias, morning stiffness and myalgias. Negative for muscle weakness and muscle tenderness.  Skin: Negative for color change, rash, hair loss, skin tightness, ulcers and sensitivity to sunlight.  Allergic/Immunologic: Negative for susceptible to infections.  Neurological: Negative for dizziness, memory loss, night sweats and weakness.  Hematological: Negative for swollen glands.  Psychiatric/Behavioral: Positive for depressed mood. Negative for sleep disturbance. The patient is nervous/anxious.     PMFS History:  Patient Active Problem List   Diagnosis Date Noted  . Hyperlipidemia 08/19/2016  . Chest pain 08/12/2016  . Chronic kidney disease, stage 2 (mild) 08/12/2016  . 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 08/12/2016  . Other long term (current) drug therapy 08/12/2016  . Type 2 diabetes mellitus with diabetic chronic kidney disease (HCC) 08/12/2016    Past Medical History:  Diagnosis Date  . Allergy   . Diabetes mellitus without complication (HCC)   . Hyperlipidemia 08/19/2016  .  Hypertension     Family History  Problem Relation Age of Onset  . Atrial fibrillation Mother   . Stroke Mother   . Heart attack Mother   . Aneurysm Mother   . Atrial fibrillation Brother   . Stroke Brother   . Diabetes Brother   . Diabetes Father   . Alzheimer's disease Father   .  Diabetes Brother   . Alzheimer's disease Maternal Grandmother   . Heart attack Maternal Grandfather   . Cancer Paternal Grandmother   . Healthy Son   . Healthy Daughter    Past Surgical History:  Procedure Laterality Date  . ABDOMINAL HYSTERECTOMY  2003  . CERVICAL SPINE SURGERY  08/2017  . CHOLECYSTECTOMY  1982  . OVARIAN CYST REMOVAL  1981   Social History   Social History Narrative  . Not on file   Immunization History  Administered Date(s) Administered  . DTaP 05/02/2012  . Influenza-Unspecified 11/30/2017  . Zoster 12/01/2016     Objective: Vital Signs: BP (!) 149/95 (BP Location: Right Arm, Patient Position: Sitting, Cuff Size: Normal)   Pulse 87   Resp 14   Ht 5\' 5"  (1.651 m)   Wt 234 lb (106.1 kg)   BMI 38.94 kg/m    Physical Exam Vitals signs and nursing note reviewed.  Constitutional:      Appearance: She is well-developed.  HENT:     Head: Normocephalic and atraumatic.  Eyes:     Conjunctiva/sclera: Conjunctivae normal.  Neck:     Musculoskeletal: Normal range of motion.  Cardiovascular:     Rate and Rhythm: Normal rate and regular rhythm.     Heart sounds: Normal heart sounds.  Pulmonary:     Effort: Pulmonary effort is normal.     Breath sounds: Normal breath sounds.  Abdominal:     General: Bowel sounds are normal.     Palpations: Abdomen is soft.  Lymphadenopathy:     Cervical: No cervical adenopathy.  Skin:    General: Skin is warm and dry.     Capillary Refill: Capillary refill takes less than 2 seconds.  Neurological:     Mental Status: She is alert and oriented to person, place, and time.  Psychiatric:        Behavior: Behavior normal.      Musculoskeletal Exam: Patient has some limitation of C-spine range of motion.  She has cervical fusion.  She had painful range of motion of her lumbar spine.  Shoulder joints were in good range of motion.  She had right elbow joint contracture.  She has synovial thickening over bilateral second  and right third MCP joint.  Mild tenderness was noted over right second and third MCP joint.  Hip joints were in good range of motion.  She has warmth on palpation of her right knee joint.  She has first MTP PIP and DIP thickening in her feet without any synovitis.  CDAI Exam: CDAI Score: Not documented Patient Global Assessment: Not documented; Provider Global Assessment: Not documented Swollen: Not documented; Tender: Not documented Joint Exam   Not documented   There is currently no information documented on the homunculus. Go to the Rheumatology activity and complete the homunculus joint exam.  Investigation: Findings:  04/19/18: ANA negative, CCP 8, RF 17.7, sed rate 57, uric acid 7.4  Component     Latest Ref Rng & Units 04/19/2018  ANA Titer 1      Negative  Cyclic Citrullin Peptide Ab     0 -  19 units 8  RA Latex Turbid.     0.0 - 13.9 IU/mL 17.7 (H)  Sed Rate     0 - 40 mm/hr 57 (H)  Uric Acid     2.5 - 7.1 mg/dL 7.4 (H)   Imaging: Xr Foot 2 Views Left  Result Date: 07/12/2018 First MTP, PIP and DIP narrowing was noted.  No erosive changes were noted.  No intertarsal joint space narrowing was noted.  No subtalar joint space narrowing was noted. Impression: These findings are consistent with osteoarthritis of the foot.  Xr Foot 2 Views Right  Result Date: 07/12/2018 First MTP, PIP and DIP narrowing was noted.  No erosive changes were noted.  Dorsal spurring was noted.  Posterior calcaneal spur and calcification of the Achilles tendon was noted. Impression: These findings are consistent with osteoarthritis of the foot.  Xr Hand 2 View Left  Result Date: 07/12/2018 CMC, PIP and DIP narrowing was noted.  No MCP, intercarpal radiocarpal joint space narrowing was noted.  No erosive changes were noted. Impression: These findings are consistent with mild osteoarthritis of the hand.  Xr Hand 2 View Right  Result Date: 07/12/2018 CMC PIP and DIP narrowing was noted.  Mild first  and second MCP joint narrowing was noted.  No intercarpal or radiocarpal joint space narrowing was noted. 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  Xr Knee 3 View Left  Result Date: 07/12/2018 No medial lateral compartment narrowing was noted.  Mild patellofemoral narrowing was noted.  Separation of tibial tuberosity was noted.  No chondrocalcinosis was noted. 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  Xr Knee 3 View Right  Result Date: 07/12/2018 Mild medial compartment narrowing was noted.  Possible chondrocalcinosis was noted.  Moderate patellofemoral narrowing was noted. Impression: These findings are consistent with osteoarthritis and moderate chondromalacia patella.  Xr Lumbar Spine 2-3 Views  Result Date: 07/12/2018 Multilevel spondylosis was noted.  Severe L3-4 narrowing with foraminal narrowing and facet joint arthropathy was noted.  Dextroscoliosis was noted.  No SI joint changes were noted. Impression: These findings are consistent with multilevel severe spondylosis and facet joint arthropathy.   Recent Labs: Lab Results  Component Value Date   WBC 5.6 12/20/2017   HGB 13.1 12/20/2017   PLT 281 12/20/2017   NA 146 (H) 04/19/2018   K 4.7 04/19/2018   CL 104 04/19/2018   CO2 23 04/19/2018   GLUCOSE 175 (H) 04/19/2018   BUN 14 04/19/2018   CREATININE 0.87 04/19/2018   BILITOT 0.4 01/11/2018   ALKPHOS 115 01/11/2018   AST 19 01/11/2018   ALT 20 01/11/2018   PROT 7.0 01/11/2018   ALBUMIN 4.7 01/11/2018   CALCIUM 10.1 04/19/2018   GFRAA 83 04/19/2018    Speciality Comments: No specialty comments available.  Procedures:  No procedures performed Allergies: Amoxicillin and Codeine   Assessment / Plan:     Visit Diagnoses: Polyarthralgia - 04/19/18: ANA negative, CCP 8, RF 17.7, sed rate 57, uric acid 7.4  Pain in both hands -patient complains of pain in bilateral  hands.  She has synovial thickening and some tenderness over her second and third MCP joints.  She has positive rheumatoid factor and elevated sedimentation rate.  She most likely have rheumatoid arthritis.  Plan: XR Hand 2 View Right, XR Hand 2 View Left, 3 days due to right second and third MCP narrowing.  14-3-3 eta Protein  Contracture of elbow joint, right-she  was not aware of the right elbow joint contracture.  She has no synovitis on palpation.  Chronic pain of both knees -she complains of bilateral knee joint discomfort.  She has warmth on palpation of her right knee joint.  Plan: XR KNEE 3 VIEW RIGHT, XR KNEE 3 VIEW LEFT.  Knee joint showed mild osteoarthritis and moderate chondromalacia patella.  There was possible chondrocalcinosis noted in the right knee joint.  Pain in both feet -she has chronic discomfort in her feet.  No warmth swelling or effusion was noted today.  Plan: XR Foot 2 Views Right, XR Foot 2 Views Left.  X-ray of the feet were consistent with osteoarthritis.  Chronic midline low back pain without sciatica -patient complains of chronic back pain.  Plan: XR Lumbar Spine 2-3 Views.  Lumbar spine showed severe multilevel spondylosis and facet joint arthropathy.  Have advised her to follow-up with Dr. Shon Baton regarding it.  DDD (degenerative disc disease), cervical - s/p fusion 2018 by Dr. Shon Baton  Idiopathic chronic gout of multiple sites without tophus-patient gives history of hyperuricemia.  She states she has had 2 injections of cortisone in her foot in the past.  She has been taking colchicine on PRN basis.  Chronic kidney disease, stage 2 (mild)-her last GFR was in the 80s.  History of type 2 diabetes mellitus  History of hyperlipidemia  High risk medication use - Plan: Hepatitis B core antibody, IgM, Hepatitis B surface antigen, Hepatitis C antibody, HIV Antibody (routine testing w rflx), QuantiFERON-TB Gold Plus, Serum protein electrophoresis with reflex, IgG, IgA,  IgM, Glucose 6 phosphate dehydrogenase   Orders: Orders Placed This Encounter  Procedures  . XR KNEE 3 VIEW RIGHT  . XR KNEE 3 VIEW LEFT  . XR Hand 2 View Right  . XR Hand 2 View Left  . XR Foot 2 Views Right  . XR Foot 2 Views Left  . XR Lumbar Spine 2-3 Views  . 14-3-3 eta Protein  . Hepatitis B core antibody, IgM  . Hepatitis B surface antigen  . Hepatitis C antibody  . HIV Antibody (routine testing w rflx)  . QuantiFERON-TB Gold Plus  . Serum protein electrophoresis with reflex  . IgG, IgA, IgM  . Glucose 6 phosphate dehydrogenase   No orders of the defined types were placed in this encounter.   Face-to-face time spent with patient was 60 minutes. Greater than 50% of time was spent in counseling and coordination of care.  Follow-Up Instructions: Return for Polyarthralgia, positive rheumatoid factor, gout.   Pollyann Savoy, MD  Note - This record has been created using Animal nutritionist.  Chart creation errors have been sought, but may not always  have been located. Such creation errors do not reflect on  the standard of medical care.

## 2018-07-12 ENCOUNTER — Ambulatory Visit: Payer: Self-pay

## 2018-07-12 ENCOUNTER — Other Ambulatory Visit: Payer: Self-pay

## 2018-07-12 ENCOUNTER — Encounter: Payer: Self-pay | Admitting: Rheumatology

## 2018-07-12 ENCOUNTER — Ambulatory Visit (INDEPENDENT_AMBULATORY_CARE_PROVIDER_SITE_OTHER): Payer: BLUE CROSS/BLUE SHIELD

## 2018-07-12 ENCOUNTER — Ambulatory Visit: Payer: BLUE CROSS/BLUE SHIELD | Admitting: Rheumatology

## 2018-07-12 VITALS — BP 149/95 | HR 87 | Resp 14 | Ht 65.0 in | Wt 234.0 lb

## 2018-07-12 DIAGNOSIS — M79641 Pain in right hand: Secondary | ICD-10-CM | POA: Diagnosis not present

## 2018-07-12 DIAGNOSIS — M25541 Pain in joints of right hand: Secondary | ICD-10-CM

## 2018-07-12 DIAGNOSIS — M1A09X0 Idiopathic chronic gout, multiple sites, without tophus (tophi): Secondary | ICD-10-CM

## 2018-07-12 DIAGNOSIS — M79671 Pain in right foot: Secondary | ICD-10-CM

## 2018-07-12 DIAGNOSIS — M25542 Pain in joints of left hand: Secondary | ICD-10-CM

## 2018-07-12 DIAGNOSIS — M79672 Pain in left foot: Secondary | ICD-10-CM | POA: Diagnosis not present

## 2018-07-12 DIAGNOSIS — M5030 Other cervical disc degeneration, unspecified cervical region: Secondary | ICD-10-CM

## 2018-07-12 DIAGNOSIS — Z79899 Other long term (current) drug therapy: Secondary | ICD-10-CM

## 2018-07-12 DIAGNOSIS — Z8639 Personal history of other endocrine, nutritional and metabolic disease: Secondary | ICD-10-CM

## 2018-07-12 DIAGNOSIS — G8929 Other chronic pain: Secondary | ICD-10-CM

## 2018-07-12 DIAGNOSIS — M25562 Pain in left knee: Secondary | ICD-10-CM

## 2018-07-12 DIAGNOSIS — M545 Low back pain, unspecified: Secondary | ICD-10-CM

## 2018-07-12 DIAGNOSIS — M25561 Pain in right knee: Secondary | ICD-10-CM

## 2018-07-12 DIAGNOSIS — M79642 Pain in left hand: Secondary | ICD-10-CM

## 2018-07-12 DIAGNOSIS — M24521 Contracture, right elbow: Secondary | ICD-10-CM

## 2018-07-12 DIAGNOSIS — M2550 Pain in unspecified joint: Secondary | ICD-10-CM

## 2018-07-12 DIAGNOSIS — N182 Chronic kidney disease, stage 2 (mild): Secondary | ICD-10-CM

## 2018-07-24 LAB — QUANTIFERON-TB GOLD PLUS
Mitogen-NIL: 7.77 IU/mL
NIL: 0.01 IU/mL
QuantiFERON-TB Gold Plus: NEGATIVE
TB1-NIL: 0 IU/mL
TB2-NIL: 0 IU/mL

## 2018-07-24 LAB — IGG, IGA, IGM
IgG (Immunoglobin G), Serum: 933 mg/dL (ref 600–1540)
IgM, Serum: 84 mg/dL (ref 50–300)
Immunoglobulin A: 252 mg/dL (ref 70–320)

## 2018-07-24 LAB — GLUCOSE 6 PHOSPHATE DEHYDROGENASE: G-6PDH: 13.7 U/g Hgb (ref 7.0–20.5)

## 2018-07-24 LAB — HIV ANTIBODY (ROUTINE TESTING W REFLEX): HIV 1&2 Ab, 4th Generation: NONREACTIVE

## 2018-07-24 LAB — PROTEIN ELECTROPHORESIS, SERUM, WITH REFLEX
Albumin ELP: 3.8 g/dL (ref 3.8–4.8)
Alpha 1: 0.3 g/dL (ref 0.2–0.3)
Alpha 2: 0.8 g/dL (ref 0.5–0.9)
Beta 2: 0.4 g/dL (ref 0.2–0.5)
Beta Globulin: 0.4 g/dL (ref 0.4–0.6)
Gamma Globulin: 0.9 g/dL (ref 0.8–1.7)
Total Protein: 6.6 g/dL (ref 6.1–8.1)

## 2018-07-24 LAB — HEPATITIS C ANTIBODY
Hepatitis C Ab: NONREACTIVE
SIGNAL TO CUT-OFF: 0.02 (ref <1.00)

## 2018-07-24 LAB — 14-3-3 ETA PROTEIN: 14-3-3 eta Protein: <0.2 ng/mL (ref <0.2)

## 2018-07-24 LAB — HEPATITIS B CORE ANTIBODY, IGM: Hep B C IgM: NONREACTIVE

## 2018-07-24 LAB — HEPATITIS B SURFACE ANTIGEN: Hepatitis B Surface Ag: NONREACTIVE

## 2018-08-08 NOTE — Progress Notes (Signed)
Office Visit Note  Patient: Ashley Monroe             Date of Birth: 11-21-1955           MRN: [id number]             PCP: Glendale Chard, MD Referring: Glendale Chard, MD Visit Date: 08/15/2018 Occupation: _0 @  Subjective:  Pain in both hands.   History of Present Illness: Ashley Monroe is a 63 y.o. female with history of seropositive rheumatoid arthritis.  She states she continues to have pain and stiffness in her bilateral hands and her wrist joints.  She notices swelling in her hands.  Continues to have discomfort in her neck and lower back.  She states the pain from her lower back has been radiating into her lower extremities.  She has difficulty getting out of the chair.  She has some discomfort in her ankles.  She also has mild discomfort in her knee joints and her feet.  But she denies any swelling in her knees and feet.  Activities of Daily Living:  Patient reports morning stiffness for 1 hour.   Patient Reports nocturnal pain.  Difficulty dressing/grooming: Denies Difficulty climbing stairs: Reports Difficulty getting out of chair: Reports Difficulty using hands for taps, buttons, cutlery, and/or writing: Reports  Review of Systems  Constitutional: Positive for fatigue.  HENT: Negative for mouth sores, mouth dryness and nose dryness.   Eyes: Negative for itching and dryness.  Respiratory: Negative for shortness of breath and difficulty breathing.   Cardiovascular: Negative for chest pain, palpitations and swelling in legs/feet.  Gastrointestinal: Negative for abdominal pain, blood in stool, constipation and diarrhea.  Endocrine: Negative for increased urination.  Genitourinary: Negative for painful urination and pelvic pain.  Musculoskeletal: Positive for arthralgias, joint pain, joint swelling and morning stiffness.  Skin: Negative for rash, hair loss and redness.  Allergic/Immunologic: Negative for susceptible to infections.  Neurological: Positive  for weakness. Negative for dizziness, light-headedness, headaches and memory loss.  Hematological: Negative for bruising/bleeding tendency.  Psychiatric/Behavioral: Negative for confusion and sleep disturbance. The patient is not nervous/anxious.     PMFS History:  Patient Active Problem List   Diagnosis Date Noted  . Hyperlipidemia 08/19/2016  . Chest pain 08/12/2016  . Chronic kidney disease, stage 2 (mild) 08/12/2016  . 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 08/12/2016  . Other long term (current) drug therapy 08/12/2016  . Type 2 diabetes mellitus with diabetic chronic kidney disease ([place]) 08/12/2016    Past Medical History:  Diagnosis Date  . Allergy   . Diabetes mellitus without complication ([place])   . Hyperlipidemia 08/19/2016  . Hypertension     Family History  Problem Relation Age of Onset  . Atrial fibrillation Mother   . Stroke Mother   . Heart attack Mother   . Aneurysm Mother   . Atrial fibrillation Brother   . Stroke Brother   . Diabetes Brother   . Diabetes Father   . Alzheimer's disease Father   . Diabetes Brother   . Alzheimer's disease Maternal Grandmother   . Heart attack Maternal Grandfather   . Cancer Paternal Grandmother   . Healthy Son   . Healthy Daughter    Past Surgical History:  Procedure Laterality Date  . ABDOMINAL HYSTERECTOMY  2003  . CERVICAL SPINE SURGERY  08/2017  . CHOLECYSTECTOMY  1982  .  OVARIAN CYST REMOVAL  1981   Social History   Social History Narrative  . Not on file   Immunization History  Administered Date(s) Administered  . DTaP 05/02/2012  . Influenza-Unspecified 11/30/2017  . Zoster 12/01/2016     Objective: Vital Signs: BP (!) 183/100 (BP Location: Left Arm, Patient Position: Sitting, Cuff Size: Normal)   Pulse 80   Resp 14   Ht 5' 5" (1.651 m)   Wt 234  lb 12.8 oz (106.5 kg)   BMI 39.07 kg/m    Physical Exam Vitals signs and nursing note reviewed.  Constitutional:      Appearance: She is well-developed.  HENT:     Head: Normocephalic and atraumatic.  Eyes:     Conjunctiva/sclera: Conjunctivae normal.  Neck:     Musculoskeletal: Normal range of motion.  Cardiovascular:     Rate and Rhythm: Normal rate and regular rhythm.     Heart sounds: Normal heart sounds.  Pulmonary:     Effort: Pulmonary effort is normal.     Breath sounds: Normal breath sounds.  Abdominal:     General: Bowel sounds are normal.     Palpations: Abdomen is soft.  Lymphadenopathy:     Cervical: No cervical adenopathy.  Skin:    General: Skin is warm and dry.     Capillary Refill: Capillary refill takes less than 2 seconds.  Neurological:     Mental Status: She is alert and oriented to person, place, and time.  Psychiatric:        Behavior: Behavior normal.      Musculoskeletal Exam: Shoulder joints elbow joints with good range of motion.  She is some discomfort range of motion of her shoulder joints.  She has synovitis in her right second MCP and PIP joints.  Some tenderness was noted in of the PIP joints.  Hip joints and knee joints with good range of motion.  CDAI Exam: CDAI Score: 11.4  Patient Global: 7 mm; Provider Global: 7 mm Swollen: 2 ; Tender: 8  Joint Exam      Right  Left  Wrist   Tender     MCP 2  Swollen Tender     PIP 2  Swollen Tender   Tender  PIP 3   Tender   Tender  PIP 4   Tender   Tender     Investigation: No additional findings.  Imaging: No results found.  Recent Labs: Lab Results  Component Value Date   WBC 5.6 12/20/2017   HGB 13.1 12/20/2017   PLT 281 12/20/2017   NA 146 (H) 04/19/2018   K 4.7 04/19/2018   CL 104 04/19/2018   CO2 23 04/19/2018   GLUCOSE 175 (H) 04/19/2018   BUN 14 04/19/2018   CREATININE 0.87 04/19/2018   BILITOT 0.4 01/11/2018   ALKPHOS 115 01/11/2018   AST 19 01/11/2018   ALT 20  01/11/2018   PROT 6.6 07/18/2018   ALBUMIN 4.7 01/11/2018   CALCIUM 10.1 04/19/2018   GFRAA 83 04/19/2018   QFTBGOLDPLUS NEGATIVE 07/18/2018  July 18, 2018 _0 eta negative, hepatitis B-, hepatitis C negative, HIV negative, G6PD normal, TB Gold negative, SPEP normal   Speciality Comments: No specialty comments available.  Procedures:  No procedures performed Allergies: Amoxicillin and Codeine   Assessment / Plan:     Visit Diagnoses: Rheumatoid arthritis involving multiple sites with positive rheumatoid factor (HCC) - Positive RF, negative anti-CCP, -_1 eta, ESR 57.  X-ray showed MCP  narrowing. - Plan: Patient has mild synovitis in her right hand and some arthralgias.  She has positive rheumatoid factor and elevated sedimentation rate.  I believe that she had more aggressive rheumatoid arthritis in the past as she has contractures in her elbow and some radiographic changes.  As her rheumatoid arthritis is not ready aggressive I would try mild treatment.  Indications side effects contraindications were discussed.  The plan is to start her on Plaquenil 200 mg p.o. twice daily total 90-day supply with no refills will be given.  She will need baseline eye examination.  We will check labs in 1 month 3 months and then every 5 months.  Contracture of elbow joint, right -patient does not recall any injury.  It is possible that she may had more aggressive arthritis in the past.  High risk medication use - Plan: Starting her on Plaquenil today.  Primary osteoarthritis of both knees - Mild osteoarthritis and moderate chondromalacia patella - Plan: Handout on knee exercises was given.  Primary osteoarthritis of both feet - Plan: She is currently not having much feet discomfort.  DDD (degenerative disc disease), lumbar - and facet joint arthropathy.  Patient is followed up by Dr. Rolena Infante.  DDD (degenerative disc disease), cervical - Status post fusion 2018 by Dr. Rolena Infante -patient continues to have  cervical spine discomfort.  Have advised her to follow-up with Dr. Rolena Infante.  Idiopathic chronic gout of multiple sites without tophus -patient states she does quite well with colchicine on PRN basis.  She denies any recent gout flare.  Although I am concerned about intercritical gout.  Her uric acid is 7.4.  The desirable range will be less than 6.  My plan is to start on allopurinol 100 mg p.o. daily at next visit.  I do not want to start any medications at one time.  Dietary modifications were discussed.  History of type 2 diabetes mellitus   Chronic kidney disease, stage 2 (mild)   History of hyperlipidemia  Orders: No orders of the defined types were placed in this encounter.  No orders of the defined types were placed in this encounter.   Face-to-face time spent with patient was 30 minutes. Greater than 50% of time was spent in counseling and coordination of care.  Follow-Up Instructions: Return in about 4 weeks (around 09/12/2018) for ROV RA, Gout.   Bo Merino, MD  Note - This record has been created using Editor, commissioning.  Chart creation errors have been sought, but may not always  have been located. Such creation errors do not reflect on  the standard of medical care.

## 2018-08-15 ENCOUNTER — Ambulatory Visit: Payer: BC Managed Care – PPO | Admitting: Rheumatology

## 2018-08-15 ENCOUNTER — Other Ambulatory Visit: Payer: Self-pay

## 2018-08-15 ENCOUNTER — Encounter: Payer: Self-pay | Admitting: *Deleted

## 2018-08-15 ENCOUNTER — Encounter: Payer: Self-pay | Admitting: Rheumatology

## 2018-08-15 VITALS — BP 183/100 | HR 80 | Resp 14 | Ht 65.0 in | Wt 234.8 lb

## 2018-08-15 DIAGNOSIS — M0579 Rheumatoid arthritis with rheumatoid factor of multiple sites without organ or systems involvement: Secondary | ICD-10-CM

## 2018-08-15 DIAGNOSIS — M24521 Contracture, right elbow: Secondary | ICD-10-CM

## 2018-08-15 DIAGNOSIS — M19071 Primary osteoarthritis, right ankle and foot: Secondary | ICD-10-CM

## 2018-08-15 DIAGNOSIS — M5030 Other cervical disc degeneration, unspecified cervical region: Secondary | ICD-10-CM

## 2018-08-15 DIAGNOSIS — M1A09X0 Idiopathic chronic gout, multiple sites, without tophus (tophi): Secondary | ICD-10-CM

## 2018-08-15 DIAGNOSIS — N182 Chronic kidney disease, stage 2 (mild): Secondary | ICD-10-CM

## 2018-08-15 DIAGNOSIS — M19072 Primary osteoarthritis, left ankle and foot: Secondary | ICD-10-CM

## 2018-08-15 DIAGNOSIS — M170 Bilateral primary osteoarthritis of knee: Secondary | ICD-10-CM

## 2018-08-15 DIAGNOSIS — M5136 Other intervertebral disc degeneration, lumbar region: Secondary | ICD-10-CM

## 2018-08-15 DIAGNOSIS — Z8639 Personal history of other endocrine, nutritional and metabolic disease: Secondary | ICD-10-CM

## 2018-08-15 DIAGNOSIS — Z79899 Other long term (current) drug therapy: Secondary | ICD-10-CM

## 2018-08-15 MED ORDER — HYDROXYCHLOROQUINE SULFATE 200 MG PO TABS: 200.0000 mg | ORAL_TABLET | Freq: Two times a day (BID) | ORAL | 0 refills | Status: DC

## 2018-08-15 NOTE — Progress Notes (Signed)
Pharmacy Note  Subjective: Patient presents today to the [facility] to see Dr. Estanislado Pandy.  Patient seen by the pharmacist for counseling on hydroxychloroquine rheumatoid arthritis. She is naive to therapy.  Objective: CMP     Component Value Date/Time   NA 146 (H) 04/19/2018 0947   K 4.7 04/19/2018 0947   CL 104 04/19/2018 0947   CO2 23 04/19/2018 0947   GLUCOSE 175 (H) 04/19/2018 0947   BUN 14 04/19/2018 0947   CREATININE 0.87 04/19/2018 0947   CALCIUM 10.1 04/19/2018 0947   PROT 6.6 07/18/2018 1452   PROT 7.0 01/11/2018 1059   ALBUMIN 4.7 01/11/2018 1059   AST 19 01/11/2018 1059   ALT 20 01/11/2018 1059   ALKPHOS 115 01/11/2018 1059   BILITOT 0.4 01/11/2018 1059   GFRNONAA 72 04/19/2018 0947   GFRAA 83 04/19/2018 0947    CBC    Component Value Date/Time   WBC 5.6 12/20/2017 1024   RBC 4.88 12/20/2017 1024   HGB 13.1 12/20/2017 1024   HCT 39.4 12/20/2017 1024   PLT 281 12/20/2017 1024   MCV 81 12/20/2017 1024   MCH 26.8 12/20/2017 1024   MCHC 33.2 12/20/2017 1024   RDW 14.1 12/20/2017 1024    Assessment/Plan: Patient was counseled on the purpose, proper use, and adverse effects of hydroxychloroquine including nausea/diarrhea, skin rash, headaches, and sun sensitivity.  Discussed importance of annual eye exams while on hydroxychloroquine to monitor to ocular toxicity and discussed importance of frequent laboratory monitoring.  Provided patient with eye exam form for baseline ophthalmologic exam and standing lab instructions.  Provided patient with educational materials on hydroxychloroquine and answered all questions.  Patient consented to hydroxychloroquine.  Will upload consent in the media tab.     Dose will be Plaquenil 200 mg twice daily based on weight of 106.5 kg and height of 5\' 5" .    She also has a history of gout. Her uric acid level was elevated at 7.4 on 04/19/2018.  She has taking colchicine as needed for gout flares.  Discussed the  possibility of adding allopurinol at next office visit as colchicine is a medication used as needed for gout flares and has no effect on uric acid levels.  Encouraged dietary modifications at this time until follow up visit.  Patient verbalized understanding.  All questions encouraged and answered.  Instructed patient to call with any further questions or concerns.  Mariella Saa, PharmD, [place], CPP Rheumatology Clinical Pharmacist  08/15/2018 10:47 AM

## 2018-08-15 NOTE — Patient Instructions (Addendum)
Standing Labs We placed an order today for your standing lab work.    Please come back and get your standing labs in 1 month, 3 months, then every 5 months.  We have open lab daily Monday through Thursday from 8:30-12:30 PM and 1:30-4:30 PM and Friday from 8:30-12:30 PM and 1:30 -4:00 PM at the office of Dr. Bo Merino.   You may experience shorter wait times on Monday and Friday afternoons. The office is located at [address], [place], [place], [place] [address] No appointment is necessary.   Labs are drawn by [facility].  You may receive a bill from [facility] for your lab work.  If you wish to have your labs drawn at another location, please call the office 24 hours in advance to send orders.  If you have any questions regarding directions or hours of operation,  please call [phone].   Just as a reminder please drink plenty of water prior to coming for your lab work. Thanks!  Vaccines You are taking a medication(s) that can suppress your immune system.  The following immunizations are recommended: . Flu annually . Pneumonia (Pneumovax 23 and Prevnar 13 spaced at least 1 year apart) . Shingrix  Please check with your PCP to make sure you are up to date.   Hydroxychloroquine tablets What is this medicine? HYDROXYCHLOROQUINE (hye drox ee KLOR oh kwin) is used to treat rheumatoid arthritis and systemic lupus erythematosus. It is also used to treat malaria. This medicine may be used for other purposes; ask your health care provider or pharmacist if you have questions. COMMON BRAND NAME(S): Plaquenil, Quineprox What should I tell my health care provider before I take this medicine? They need to know if you have any of these conditions:  diabetes  eye disease, vision problems  G6PD deficiency  heart disease  history of irregular heartbeat  if you often drink alcohol  kidney disease  liver disease  porphyria  psoriasis  an unusual or allergic reaction to  chloroquine, hydroxychloroquine, other medicines, foods, dyes, or preservatives  pregnant or trying to get pregnant  breast-feeding How should I use this medicine? Take this medicine by mouth with a glass of water. Follow the directions on the prescription label. Do not cut, crush or chew this medicine. Swallow the tablets whole. Take this medicine with food. Avoid taking antacids within 4 hours of taking this medicine. It is best to separate these medicines by at least 4 hours. Take your medicine at regular intervals. Do not take it more often than directed. Take all of your medicine as directed even if you think you are better. Do not skip doses or stop your medicine early. Talk to your pediatrician regarding the use of this medicine in children. While this drug may be prescribed for selected conditions, precautions do apply. Overdosage: If you think you have taken too much of this medicine contact a poison control center or emergency room at once. NOTE: This medicine is only for you. Do not share this medicine with others. What if I miss a dose? If you miss a dose, take it as soon as you can. If it is almost time for your next dose, take only that dose. Do not take double or extra doses. What may interact with this medicine? Do not take this medicine with any of the following medications:  cisapride  dronedarone  pimozide  thioridazine This medicine may also interact with the following medications:  ampicillin  antacids  cimetidine  cyclosporine  digoxin  kaolin  medicines for diabetes, like insulin, glipizide, glyburide  medicines for seizures like carbamazepine, phenobarbital, phenytoin  mefloquine  methotrexate  other medicines that prolong the QT interval (cause an abnormal heart rhythm)  praziquantel This list may not describe all possible interactions. Give your health care provider a list of all the medicines, herbs, non-prescription drugs, or dietary  supplements you use. Also tell them if you smoke, drink alcohol, or use illegal drugs. Some items may interact with your medicine. What should I watch for while using this medicine? Visit your health care professional for regular checks on your progress. Tell your health care professional if your symptoms do not start to get better or if they get worse. You may need blood work done while you are taking this medicine. If you take other medicines that can affect heart rhythm, you may need more testing. Talk to your health care professional if you have questions. Your vision may be tested before and during use of this medicine. Tell your health care professional right away if you have any change in your eyesight. What side effects may I notice from receiving this medicine? Side effects that you should report to your doctor or health care professional as soon as possible:  allergic reactions like skin rash, itching or hives, swelling of the face, lips, or tongue  changes in vision  decreased hearing or ringing of the ears  muscle weakness  redness, blistering, peeling or loosening of the skin, including inside the mouth  sensitivity to light  signs and symptoms of a dangerous change in heartbeat or heart rhythm like chest pain; dizziness; fast or irregular heartbeat; palpitations; feeling faint or lightheaded, falls; breathing problems  signs and symptoms of liver injury like dark yellow or brown urine; general ill feeling or flu-like symptoms; light-colored stools; loss of appetite; nausea; right upper belly pain; unusually weak or tired; yellowing of the eyes or skin  signs and symptoms of low blood sugar such as feeling anxious; confusion; dizziness; increased hunger; unusually weak or tired; sweating; shakiness; cold; irritable; headache; blurred vision; fast heartbeat; loss of consciousness  suicidal thoughts  uncontrollable head, mouth, neck, arm, or leg movements Side effects that  usually do not require medical attention (report to your doctor or health care professional if they continue or are bothersome):  diarrhea  dizziness  hair loss  headache  irritable  loss of appetite  nausea, vomiting  stomach pain This list may not describe all possible side effects. Call your doctor for medical advice about side effects. You may report side effects to FDA at 1-800-FDA-1088. Where should I keep my medicine? Keep out of the reach of children. Store at room temperature between 15 and 30 degrees C (59 and 86 degrees F). Protect from moisture and light. Throw away any unused medicine after the expiration date. NOTE: This sheet is a summary. It may not cover all possible information. If you have questions about this medicine, talk to your doctor, pharmacist, or health care provider.  2020 [employer]vier/Gold Standard (2018-06-05 12:56:32)

## 2018-08-22 ENCOUNTER — Ambulatory Visit: Payer: BLUE CROSS/BLUE SHIELD | Admitting: Internal Medicine

## 2018-08-28 DIAGNOSIS — M961 Postlaminectomy syndrome, not elsewhere classified: Secondary | ICD-10-CM | POA: Insufficient documentation

## 2018-08-31 NOTE — Progress Notes (Signed)
Office Visit Note  Patient: Ashley Monroe             Date of Birth: 11-07-1955           MRN: [id number]             PCP: Glendale Chard, MD Referring: Glendale Chard, MD Visit Date: 09/14/2018 Occupation: _0 @  Subjective:  Pain in both hands   History of Present Illness: Ashley Monroe is a 63 y.o. female with history of seropositive rheumatoid arthritis, osteoarthritis, gout, and DDD.  She was started on Plaquenil 200 mg 1 tablet twice daily at the beginning of July 2020.  She has been tolerating Plaquenil without any side effects.  She has not noticed any improvements since starting on Plaquenil.  She continues to have pain and inflammation in bilateral hands, worse in her right hand.  She also has pain in both feet especially when she is barefoot.  She takes colchicine 0.6 mg 1 capsule by mouth daily as needed during a gout flare.  She has not had any recent gout flares.  She denies any new joint pain or joint swelling at this time.  She continues to have chronic neck and lower back pain.  She has seen Dr. Nelva Bush in the past who prescribes gabapentin which provides some pain relief.   Activities of Daily Living:  Patient reports morning stiffness for 1 hour.   Patient Reports nocturnal pain.  Difficulty dressing/grooming: Denies Difficulty climbing stairs: Reports Difficulty getting out of chair: Reports Difficulty using hands for taps, buttons, cutlery, and/or writing: Reports  Review of Systems  Constitutional: Positive for fatigue.  HENT: Negative for mouth sores, mouth dryness and nose dryness.   Eyes: Negative for pain, itching, visual disturbance and dryness.  Respiratory: Negative for cough, hemoptysis, shortness of breath and difficulty breathing.   Cardiovascular: Negative for chest pain, palpitations, hypertension and swelling in legs/feet.  Gastrointestinal: Negative for abdominal pain, blood in stool, constipation and diarrhea.  Endocrine: Negative for  increased urination.  Genitourinary: Negative for painful urination.  Musculoskeletal: Positive for arthralgias, joint pain, joint swelling and morning stiffness. Negative for myalgias, muscle weakness, muscle tenderness and myalgias.  Skin: Negative for color change, pallor, rash, hair loss, nodules/bumps, redness, skin tightness, ulcers and sensitivity to sunlight.  Allergic/Immunologic: Negative for susceptible to infections.  Neurological: Negative for dizziness, light-headedness, numbness, headaches and memory loss.  Hematological: Negative for bruising/bleeding tendency and swollen glands.  Psychiatric/Behavioral: Negative for depressed mood, confusion and sleep disturbance. The patient is not nervous/anxious.     PMFS History:  Patient Active Problem List   Diagnosis Date Noted  . Hyperlipidemia 08/19/2016  . Chest pain 08/12/2016  . Chronic kidney disease, stage 2 (mild) 08/12/2016  . 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 08/12/2016  . Other long term (current) drug therapy 08/12/2016  . Type 2 diabetes mellitus with diabetic chronic kidney disease ([place]) 08/12/2016    Past Medical History:  Diagnosis Date  . Allergy   . Diabetes mellitus without complication ([place])   . Hyperlipidemia 08/19/2016  . Hypertension     Family History  Problem Relation Age of Onset  . Atrial fibrillation Mother   . Stroke Mother   . Heart attack Mother   . Aneurysm Mother   . Atrial fibrillation Brother   . Stroke Brother   . Diabetes Brother   .  Diabetes Father   . Alzheimer's disease Father   . Diabetes Brother   . Alzheimer's disease Maternal Grandmother   . Heart attack Maternal Grandfather   . Cancer Paternal Grandmother   . Healthy Son   . Healthy Daughter    Past Surgical History:  Procedure Laterality Date  . ABDOMINAL  HYSTERECTOMY  2003  . CERVICAL SPINE SURGERY  08/2017  . CHOLECYSTECTOMY  1982  . OVARIAN CYST REMOVAL  1981   Social History   Social History Narrative  . Not on file   Immunization History  Administered Date(s) Administered  . DTaP 05/02/2012  . Influenza-Unspecified 11/30/2017  . Zoster 12/01/2016     Objective: Vital Signs: BP 139/84 (BP Location: Left Arm, Patient Position: Sitting, Cuff Size: Normal)   Pulse 92   Resp 14   Ht _0  (1.651 m)   Wt 234 lb (106.1 kg)   BMI 38.94 kg/m    Physical Exam Vitals signs and nursing note reviewed.  Constitutional:      Appearance: She is well-developed.  HENT:     Head: Normocephalic and atraumatic.  Eyes:     Conjunctiva/sclera: Conjunctivae normal.  Neck:     Musculoskeletal: Normal range of motion.  Cardiovascular:     Rate and Rhythm: Normal rate and regular rhythm.     Heart sounds: Normal heart sounds.  Pulmonary:     Effort: Pulmonary effort is normal.     Breath sounds: Normal breath sounds.  Abdominal:     General: Bowel sounds are normal.     Palpations: Abdomen is soft.  Lymphadenopathy:     Cervical: No cervical adenopathy.  Skin:    General: Skin is warm and dry.     Capillary Refill: Capillary refill takes less than 2 seconds.  Neurological:     Mental Status: She is alert and oriented to person, place, and time.  Psychiatric:        Behavior: Behavior normal.      Musculoskeletal Exam: C-spine limited range of motion with discomfort.  She has limited range of motion with also lumbar spine.  She has midline spinal tenderness in the lumbar region.  No SI joint tenderness.  Shoulder joints have good range of motion.  She does have discomfort in the right shoulder with internal rotation.  Right elbow joint contracture noted.  Left elbow has full range of motion with no tenderness or inflammation.  Wrist joints have good range of motion with no synovitis.  MCPs, PIPs, DIPs good range of motion.  She has  tenderness and synovitis of the right second MCP joint and right second PIP joint.  She has tenderness of several MCP joints as described above.  Hip joints have good range of motion with no discomfort.  Knee joints, ankle joints, MTPs, PIPs, DIPs good range of motion no synovitis.  No warmth or effusion of bilateral knee joints.  No tenderness or swelling of ankle joints.  CDAI Exam: CDAI Score: - Patient Global: -; Provider Global: - Swollen: 5 ; Tender: 7  Joint Exam      Right  Left  MCP 2  Swollen Tender  Swollen Tender  MCP 3  Swollen Tender  Swollen Tender  MCP 5   Tender     PIP 2  Swollen Tender     PIP 3   Tender        Investigation: No additional findings.  Imaging: No results found.  Recent Labs: Lab Results  Component Value  Date   WBC 5.3 09/12/2018   HGB 13.9 09/12/2018   PLT 315 09/12/2018   NA 140 09/12/2018   K 4.4 09/12/2018   CL 102 09/12/2018   CO2 29 09/12/2018   GLUCOSE 150 (H) 09/12/2018   BUN 29 (H) 09/12/2018   CREATININE 1.01 (H) 09/12/2018   BILITOT 0.4 09/12/2018   ALKPHOS 115 01/11/2018   AST 14 09/12/2018   ALT 17 09/12/2018   PROT 7.2 09/12/2018   ALBUMIN 4.7 01/11/2018   CALCIUM 10.3 09/12/2018   GFRAA 70 09/12/2018   QFTBGOLDPLUS NEGATIVE 07/18/2018    Speciality Comments: No specialty comments available.  Procedures:  No procedures performed Allergies: Amoxicillin and Codeine   Assessment / Plan:     Visit Diagnoses: Rheumatoid arthritis involving multiple sites with positive rheumatoid factor (HCC) -  Positive RF, negative anti-CCP, -_0 eta, ESR 57.  X-ray showed MCP narrowing - She has tenderness and synovitis of the right second MCP and right second PIP joints.  She has tenderness of several MCP joints as described above.  She was started on Plaquenil 200 mg 1 tablet twice daily at the beginning of July 2020.  She has not noticed any improvements since starting on Plaquenil 1 month ago.  She is tolerating Plaquenil  without any side effects.  She will continue taking Plaquenil as prescribed.  She does not need any refills at this time.  She was advised to notify us if she develops increased joint pain or joint swelling.  She will follow-up in the office in 5 months.  High risk medication use -She was started on Plaquenil 200 mg 1 tablet twice daily in July 2020.  Creatinine was 1.01 and GFR was 70 on 09/12/2018.  Lab work was sent to her PCP.  We will continue to monitor CBC and CMP every 5 months.  Contracture of elbow joint, right - Chronic.  She has no tenderness or inflammation.  She has no discomfort at this time.   Primary osteoarthritis of both knees - She has good range of motion with no discomfort.  No warmth or effusion of knee joints noted.  Primary osteoarthritis of both feet -She has intermittent pain in both feet.  She has no joint swelling.  She wears proper fitting shoes.  DDD (degenerative disc disease), lumbar -Chronic pain.  She has limited range of motion with discomfort.  DDD (degenerative disc disease), cervical - Chronic pain.  She has limited ROM with discomfort.  She has no symptoms of radiculopathy.  She is followed by Dr. Nelva Bush.  She takes gabapentin as prescribed.   Idiopathic chronic gout of multiple sites without tophus - She has not had any recent gout flares.  She takes colchicine 0.6 mg 1 capsule by mouth daily as needed during a flare.  Other medical conditions are listed as follows:   History of type 2 diabetes mellitus   Chronic kidney disease, stage 2 (mild)  History of hyperlipidemia   Orders: No orders of the defined types were placed in this encounter.  No orders of the defined types were placed in this encounter.   Follow-Up Instructions: Return in about 2 months (around 11/14/2018) for Rheumatoid arthritis, Osteoarthritis, Gout, DDD.   Ofilia Neas, PA-C   I examined and evaluated the patient with Hazel Sams PA.  Patient had been on Plaquenil for  approximately a month now.  She still has ongoing synovitis.  I discussed adding methotrexate.  At this point she would like to  hold off.  We will see the response to the Plaquenil over the next 2 months.  The plan of care was discussed as noted above.  Bo Merino, MD  Note - This record has been created using Editor, commissioning.  Chart creation errors have been sought, but may not always  have been located. Such creation errors do not reflect on  the standard of medical care.

## 2018-09-04 ENCOUNTER — Other Ambulatory Visit: Payer: Self-pay

## 2018-09-04 ENCOUNTER — Ambulatory Visit (INDEPENDENT_AMBULATORY_CARE_PROVIDER_SITE_OTHER): Payer: BC Managed Care – PPO | Admitting: Internal Medicine

## 2018-09-04 ENCOUNTER — Encounter: Payer: Self-pay | Admitting: Internal Medicine

## 2018-09-04 VITALS — BP 154/88 | HR 72 | Temp 98.3°F | Ht 65.0 in | Wt 234.2 lb

## 2018-09-04 DIAGNOSIS — I129 Hypertensive chronic kidney disease with stage 1 through stage 4 chronic kidney disease, or unspecified chronic kidney disease: Secondary | ICD-10-CM | POA: Diagnosis not present

## 2018-09-04 DIAGNOSIS — E1122 Type 2 diabetes mellitus with diabetic chronic kidney disease: Secondary | ICD-10-CM | POA: Diagnosis not present

## 2018-09-04 DIAGNOSIS — N182 Chronic kidney disease, stage 2 (mild): Secondary | ICD-10-CM

## 2018-09-04 DIAGNOSIS — Z6838 Body mass index (BMI) 38.0-38.9, adult: Secondary | ICD-10-CM

## 2018-09-04 DIAGNOSIS — M542 Cervicalgia: Secondary | ICD-10-CM

## 2018-09-04 MED ORDER — AMLODIPINE BESYLATE 10 MG PO TABS: 10.0000 mg | ORAL_TABLET | Freq: Every day | ORAL | 1 refills | Status: DC

## 2018-09-04 MED ORDER — LEVEMIR FLEXTOUCH 100 UNIT/ML ~~LOC~~ SOPN: PEN_INJECTOR | SUBCUTANEOUS | 1 refills | Status: DC

## 2018-09-04 MED ORDER — METFORMIN HCL 500 MG PO TABS: 500.0000 mg | ORAL_TABLET | Freq: Two times a day (BID) | ORAL | 1 refills | Status: DC

## 2018-09-04 MED ORDER — OLMESARTAN MEDOXOMIL-HCTZ 40-25 MG PO TABS: 1.0000 | ORAL_TABLET | Freq: Every day | ORAL | 1 refills | Status: DC

## 2018-09-04 MED ORDER — AMLODIPINE BESYLATE 5 MG PO TABS: 5.0000 mg | ORAL_TABLET | Freq: Every day | ORAL | 1 refills | Status: DC

## 2018-09-04 MED ORDER — OZEMPIC (0.25 OR 0.5 MG/DOSE) 2 MG/1.5ML ~~LOC~~ SOPN: 0.5000 mg | PEN_INJECTOR | SUBCUTANEOUS | 1 refills | Status: DC

## 2018-09-04 NOTE — Progress Notes (Signed)
Subjective:     Patient ID: Ashley Monroe , female    DOB: 1955/12/19 , 63 y.o.   MRN: [id number]   Chief Complaint  Patient presents with  . Diabetes  . Hypertension    HPI  Diabetes She presents for her follow-up diabetic visit. She has type 2 diabetes mellitus. Her disease course has been stable. There are no hypoglycemic associated symptoms. Pertinent negatives for diabetes include no blurred vision and no chest pain. There are no hypoglycemic complications. Risk factors for coronary artery disease include diabetes mellitus, dyslipidemia, hypertension, obesity and sedentary lifestyle. She is following a generally healthy diet. Her breakfast blood glucose is taken between 7-8 am. Her breakfast blood glucose range is generally 130-140 mg/dl. An ACE inhibitor/angiotensin II receptor blocker is being taken. Eye exam is current.  Hypertension This is a chronic problem. The current episode started more than 1 year ago. The problem has been gradually improving since onset. The problem is controlled. Associated symptoms include neck pain. Pertinent negatives include no blurred vision, chest pain, palpitations or shortness of breath. Risk factors for coronary artery disease include obesity, post-menopausal state and sedentary lifestyle. Past treatments include angiotensin blockers, diuretics and calcium channel blockers.     Past Medical History:  Diagnosis Date  . Allergy   . Diabetes mellitus without complication (HCC)   . Hyperlipidemia 08/19/2016  . Hypertension      Family History  Problem Relation Age of Onset  . Atrial fibrillation Mother   . Stroke Mother   . Heart attack Mother   . Aneurysm Mother   . Atrial fibrillation Brother   . Stroke Brother   . Diabetes Brother   . Diabetes Father   . Alzheimer's disease Father   . Diabetes Brother   . Alzheimer's disease Maternal Grandmother   . Heart attack Maternal Grandfather   . Cancer Paternal Grandmother   . Healthy  Son   . Healthy Daughter      Current Outpatient Medications:  .  aspirin EC 81 MG tablet, Take 81 mg by mouth daily., Disp: , Rfl:  .  cetirizine (ZYRTEC) 10 MG tablet, Take 10 mg by mouth daily., Disp: , Rfl:  .  Colchicine 0.6 MG CAPS, Take 0.6 mg by mouth daily as needed. (for gout), Disp: , Rfl: 0 .  cyclobenzaprine (FLEXERIL) 10 MG tablet, Take 10 mg by mouth at bedtime as needed for muscle spasms., Disp: , Rfl: 0 .  ezetimibe (ZETIA) 10 MG tablet, Take 1 tablet (10 mg total) by mouth daily., Disp: 90 tablet, Rfl: 3 .  hydroxychloroquine (PLAQUENIL) 200 MG tablet, Take 1 tablet (200 mg total) by mouth 2 (two) times daily., Disp: 180 tablet, Rfl: 0 .  Insulin Detemir (LEVEMIR FLEXTOUCH) 100 UNIT/ML Pen, Inject 10 units subcutaneous daily at bedtime, Disp: 15 mL, Rfl: 1 .  metFORMIN (GLUCOPHAGE) 500 MG tablet, Take 1 tablet (500 mg total) by mouth 2 (two) times daily with a meal., Disp: 180 tablet, Rfl: 1 .  olmesartan-hydrochlorothiazide (BENICAR HCT) 40-25 MG tablet, Take 1 tablet by mouth daily., Disp: 90 tablet, Rfl: 1 .  OZEMPIC, 0.25 OR 0.5 MG/DOSE, 2 MG/1.5ML SOPN, Inject 0.5 mg into the skin once a week., Disp: 3 pen, Rfl: 1 .  rosuvastatin (CRESTOR) 20 MG tablet, Take 1 tablet (20 mg total) by mouth every Monday, Wednesday, and Friday., Disp: 12 tablet, Rfl: 6 .  traMADol (ULTRAM) 50 MG tablet, Take 1 tablet (50 mg total) by mouth every 6 (six) hours  as needed., Disp: 20 tablet, Rfl: 0 .  Vitamin D, Ergocalciferol, (DRISDOL) 50000 units CAPS capsule, Take 1 capsule by mouth 2 (two) times a week. Tuesdays and Fridays, Disp: , Rfl: 2 .  amLODipine (NORVASC) 5 MG tablet, Take 1 tablet (5 mg total) by mouth daily., Disp: 90 tablet, Rfl: 1   Allergies  Allergen Reactions  . Amoxicillin Hives  . Codeine Hives     Review of Systems  Constitutional: Negative.   Eyes: Negative for blurred vision.  Respiratory: Negative.  Negative for shortness of breath.   Cardiovascular: Negative.   Negative for chest pain and palpitations.  Gastrointestinal: Negative.   Musculoskeletal: Positive for neck pain.       She states she think bp is elevated due to neck pain. She was seen by Ortho today. He is sending in meds. She has continued to work despite the pain  Neurological: Negative.   Psychiatric/Behavioral: Negative.      Today's Vitals   09/04/18 1540  BP: (!) 154/88  Pulse: 72  Temp: 98.3 F (36.8 C)  TempSrc: Oral  Weight: 234 lb 3.2 oz (106.2 kg)  Height: 5\' 5"  (1.651 m)  PainSc: 6   PainLoc: Neck   Body mass index is 38.97 kg/m.   Objective:  Physical Exam Vitals signs and nursing note reviewed.  Constitutional:      Appearance: Normal appearance.  HENT:     Head: Normocephalic and atraumatic.  Neck:     Comments: Healed surgical scar posterior neck/upper back. Tense/tender trapezius mm. To palpation bilaterally Cardiovascular:     Rate and Rhythm: Normal rate and regular rhythm.     Heart sounds: Normal heart sounds.  Pulmonary:     Effort: Pulmonary effort is normal.     Breath sounds: Normal breath sounds.  Skin:    General: Skin is warm.  Neurological:     General: No focal deficit present.     Mental Status: She is alert.  Psychiatric:        Mood and Affect: Mood normal.        Behavior: Behavior normal.         Assessment And Plan:     1. Type 2 diabetes mellitus with stage 2 chronic kidney disease, without long-term current use of insulin ([place])  I plan to increase her dose of Ozempic to 1mg  once weekly. She is willing to do this, in hopes that she will eventually be weaned off of insulin. Importance of dietary/exercise and medication compliance was discussed with the patient.   2. Hypertensive nephropathy  Uncontrolled. I will increase her amlodipine to 10mg  daily. She is advised to take this nightly. She agrees to rto in four weeks for a bp check.   3. Cervicalgia  She was seen by Dr. Nelva Bush earlier today and she reports he will  be sending in a rx gabapentin. She is not sure what the dose will be. She was also advised to apply Vicks Vaporub to affected area nightly. She is encouraged to keep me posted on her progress.   4. Class 2 severe obesity due to excess calories with serious comorbidity and body mass index (BMI) of 38.0 to 38.9 in adult [facility])  Importance of achieving optimal weight to decrease risk of cardiovascular disease and cancers was discussed with the patient in full detail. She is encouraged to start slowly - start with 10 minutes twice daily at least three to four days per week and to gradually build to 30  minutes five days weekly. She was given tips to incorporate more activity into her daily routine - take stairs when possible, park farther away from her job, grocery stores, etc.    [name], MD    THE PATIENT IS ENCOURAGED TO PRACTICE SOCIAL DISTANCING DUE TO THE COVID-19 PANDEMIC.

## 2018-09-04 NOTE — Patient Instructions (Signed)

## 2018-09-05 LAB — HEMOGLOBIN A1C
Est. average glucose Bld gHb Est-mCnc: 189 mg/dL
Hgb A1c MFr Bld: 8.2 % — ABNORMAL HIGH (ref 4.8–5.6)

## 2018-09-05 LAB — BMP8+EGFR
BUN/Creatinine Ratio: 16 (ref 12–28)
BUN: 13 mg/dL (ref 8–27)
CO2: 24 mmol/L (ref 20–29)
Calcium: 9.8 mg/dL (ref 8.7–10.3)
Chloride: 102 mmol/L (ref 96–106)
Creatinine, Ser: 0.8 mg/dL (ref 0.57–1.00)
GFR calc Af Amer: 92 mL/min/{1.73_m2} (ref >59)
GFR calc non Af Amer: 80 mL/min/{1.73_m2} (ref >59)
Glucose: 117 mg/dL — ABNORMAL HIGH (ref 65–99)
Potassium: 3.6 mmol/L (ref 3.5–5.2)
Sodium: 144 mmol/L (ref 134–144)

## 2018-09-12 ENCOUNTER — Other Ambulatory Visit: Payer: Self-pay | Admitting: *Deleted

## 2018-09-12 DIAGNOSIS — Z79899 Other long term (current) drug therapy: Secondary | ICD-10-CM

## 2018-09-13 LAB — COMPLETE METABOLIC PANEL WITH GFR
AG Ratio: 1.7 (calc) (ref 1.0–2.5)
ALT: 17 U/L (ref 6–29)
AST: 14 U/L (ref 10–35)
Albumin: 4.5 g/dL (ref 3.6–5.1)
Alkaline phosphatase (APISO): 129 U/L (ref 37–153)
BUN/Creatinine Ratio: 29 (calc) — ABNORMAL HIGH (ref 6–22)
BUN: 29 mg/dL — ABNORMAL HIGH (ref 7–25)
CO2: 29 mmol/L (ref 20–32)
Calcium: 10.3 mg/dL (ref 8.6–10.4)
Chloride: 102 mmol/L (ref 98–110)
Creat: 1.01 mg/dL — ABNORMAL HIGH (ref 0.50–0.99)
GFR, Est African American: 70 mL/min/{1.73_m2} (ref >=60)
GFR, Est Non African American: 60 mL/min/{1.73_m2} (ref >=60)
Globulin: 2.7 g/dL (calc) (ref 1.9–3.7)
Glucose, Bld: 150 mg/dL — ABNORMAL HIGH (ref 65–99)
Potassium: 4.4 mmol/L (ref 3.5–5.3)
Sodium: 140 mmol/L (ref 135–146)
Total Bilirubin: 0.4 mg/dL (ref 0.2–1.2)
Total Protein: 7.2 g/dL (ref 6.1–8.1)

## 2018-09-13 LAB — CBC WITH DIFFERENTIAL/PLATELET
Absolute Monocytes: 451 cells/uL (ref 200–950)
Basophils Absolute: 69 cells/uL (ref 0–200)
Basophils Relative: 1.3 %
Eosinophils Absolute: 148 cells/uL (ref 15–500)
Eosinophils Relative: 2.8 %
HCT: 43 % (ref 35.0–45.0)
Hemoglobin: 13.9 g/dL (ref 11.7–15.5)
Lymphs Abs: 2306 cells/uL (ref 850–3900)
MCH: 26.4 pg — ABNORMAL LOW (ref 27.0–33.0)
MCHC: 32.3 g/dL (ref 32.0–36.0)
MCV: 81.6 fL (ref 80.0–100.0)
MPV: 10.6 fL (ref 7.5–12.5)
Monocytes Relative: 8.5 %
Neutro Abs: 2327 cells/uL (ref 1500–7800)
Neutrophils Relative %: 43.9 %
Platelets: 315 10*3/uL (ref 140–400)
RBC: 5.27 10*6/uL — ABNORMAL HIGH (ref 3.80–5.10)
RDW: 14.1 % (ref 11.0–15.0)
Total Lymphocyte: 43.5 %
WBC: 5.3 10*3/uL (ref 3.8–10.8)

## 2018-09-13 NOTE — Progress Notes (Signed)
Creatinine is mildly elevated.  It could be because of diabetes.  Please forward the labs to her PCP.

## 2018-09-14 ENCOUNTER — Other Ambulatory Visit: Payer: Self-pay

## 2018-09-14 ENCOUNTER — Encounter: Payer: Self-pay | Admitting: Rheumatology

## 2018-09-14 ENCOUNTER — Ambulatory Visit (INDEPENDENT_AMBULATORY_CARE_PROVIDER_SITE_OTHER): Payer: BC Managed Care – PPO | Admitting: Rheumatology

## 2018-09-14 VITALS — BP 139/84 | HR 92 | Resp 14 | Ht 65.0 in | Wt 234.0 lb

## 2018-09-14 DIAGNOSIS — M24521 Contracture, right elbow: Secondary | ICD-10-CM

## 2018-09-14 DIAGNOSIS — M19072 Primary osteoarthritis, left ankle and foot: Secondary | ICD-10-CM

## 2018-09-14 DIAGNOSIS — Z79899 Other long term (current) drug therapy: Secondary | ICD-10-CM

## 2018-09-14 DIAGNOSIS — Z8639 Personal history of other endocrine, nutritional and metabolic disease: Secondary | ICD-10-CM

## 2018-09-14 DIAGNOSIS — M1A09X0 Idiopathic chronic gout, multiple sites, without tophus (tophi): Secondary | ICD-10-CM

## 2018-09-14 DIAGNOSIS — M19071 Primary osteoarthritis, right ankle and foot: Secondary | ICD-10-CM

## 2018-09-14 DIAGNOSIS — N182 Chronic kidney disease, stage 2 (mild): Secondary | ICD-10-CM

## 2018-09-14 DIAGNOSIS — M5136 Other intervertebral disc degeneration, lumbar region: Secondary | ICD-10-CM

## 2018-09-14 DIAGNOSIS — M5030 Other cervical disc degeneration, unspecified cervical region: Secondary | ICD-10-CM

## 2018-09-14 DIAGNOSIS — M0579 Rheumatoid arthritis with rheumatoid factor of multiple sites without organ or systems involvement: Secondary | ICD-10-CM | POA: Diagnosis not present

## 2018-09-14 DIAGNOSIS — M170 Bilateral primary osteoarthritis of knee: Secondary | ICD-10-CM

## 2018-10-09 LAB — HM DIABETES EYE EXAM

## 2018-10-10 ENCOUNTER — Encounter: Payer: Self-pay | Admitting: Internal Medicine

## 2018-10-10 ENCOUNTER — Ambulatory Visit (INDEPENDENT_AMBULATORY_CARE_PROVIDER_SITE_OTHER): Payer: BC Managed Care – PPO | Admitting: Internal Medicine

## 2018-10-10 ENCOUNTER — Other Ambulatory Visit: Payer: Self-pay

## 2018-10-10 VITALS — BP 112/64 | HR 94 | Temp 98.3°F | Ht 65.0 in | Wt 229.8 lb

## 2018-10-10 DIAGNOSIS — E1122 Type 2 diabetes mellitus with diabetic chronic kidney disease: Secondary | ICD-10-CM

## 2018-10-10 DIAGNOSIS — N182 Chronic kidney disease, stage 2 (mild): Secondary | ICD-10-CM

## 2018-10-10 DIAGNOSIS — I129 Hypertensive chronic kidney disease with stage 1 through stage 4 chronic kidney disease, or unspecified chronic kidney disease: Secondary | ICD-10-CM

## 2018-10-10 DIAGNOSIS — Z6838 Body mass index (BMI) 38.0-38.9, adult: Secondary | ICD-10-CM

## 2018-10-10 NOTE — Patient Instructions (Signed)

## 2018-10-11 LAB — HEMOGLOBIN A1C
Est. average glucose Bld gHb Est-mCnc: 171 mg/dL
Hgb A1c MFr Bld: 7.6 % — ABNORMAL HIGH (ref 4.8–5.6)

## 2018-10-22 NOTE — Progress Notes (Signed)
Subjective:     Patient ID: Ashley Monroe , female    DOB: 04-07-1955 , 63 y.o.   MRN: [id number]   Chief Complaint  Patient presents with  . Diabetes  . Hypertension    HPI  Diabetes She presents for her follow-up diabetic visit. She has type 2 diabetes mellitus. Her disease course has been stable. There are no hypoglycemic associated symptoms. Pertinent negatives for diabetes include no blurred vision and no chest pain. There are no hypoglycemic complications. Risk factors for coronary artery disease include diabetes mellitus, dyslipidemia, hypertension, obesity and sedentary lifestyle. She is following a generally healthy diet. Her breakfast blood glucose is taken between 7-8 am. Her breakfast blood glucose range is generally 130-140 mg/dl.  Hypertension This is a chronic problem. The current episode started more than 1 year ago. The problem has been gradually improving since onset. The problem is controlled. Pertinent negatives include no blurred vision, chest pain, palpitations or shortness of breath.     Past Medical History:  Diagnosis Date  . Allergy   . Diabetes mellitus without complication ([place])   . Hyperlipidemia 08/19/2016  . Hypertension      Family History  Problem Relation Age of Onset  . Atrial fibrillation Mother   . Stroke Mother   . Heart attack Mother   . Aneurysm Mother   . Atrial fibrillation Brother   . Stroke Brother   . Diabetes Brother   . Diabetes Father   . Alzheimer's disease Father   . Diabetes Brother   . Alzheimer's disease Maternal Grandmother   . Heart attack Maternal Grandfather   . Cancer Paternal Grandmother   . Healthy Son   . Healthy Daughter      Current Outpatient Medications:  .  amLODipine (NORVASC) 10 MG tablet, Take 1 tablet (10 mg total) by mouth daily., Disp: 90 tablet, Rfl: 1 .  aspirin EC 81 MG tablet, Take 81 mg by mouth daily., Disp: , Rfl:  .  cetirizine (ZYRTEC) 10 MG tablet, Take 10 mg by mouth daily., Disp:  , Rfl:  .  Colchicine 0.6 MG CAPS, Take 0.6 mg by mouth daily as needed. (for gout), Disp: , Rfl: 0 .  cyclobenzaprine (FLEXERIL) 10 MG tablet, Take 10 mg by mouth at bedtime as needed for muscle spasms., Disp: , Rfl: 0 .  gabapentin (NEURONTIN) 300 MG capsule, 2 (two) times daily., Disp: , Rfl:  .  hydroxychloroquine (PLAQUENIL) 200 MG tablet, Take 1 tablet (200 mg total) by mouth 2 (two) times daily., Disp: 180 tablet, Rfl: 0 .  Insulin Detemir (LEVEMIR FLEXTOUCH) 100 UNIT/ML Pen, Inject 10 units subcutaneous daily at bedtime, Disp: 15 mL, Rfl: 1 .  metFORMIN (GLUCOPHAGE) 500 MG tablet, Take 1 tablet (500 mg total) by mouth 2 (two) times daily with a meal., Disp: 180 tablet, Rfl: 1 .  olmesartan-hydrochlorothiazide (BENICAR HCT) 40-25 MG tablet, Take 1 tablet by mouth daily., Disp: 90 tablet, Rfl: 1 .  OZEMPIC, 0.25 OR 0.5 MG/DOSE, 2 MG/1.5ML SOPN, Inject 0.5 mg into the skin once a week., Disp: 3 pen, Rfl: 1 .  rosuvastatin (CRESTOR) 20 MG tablet, Take 1 tablet (20 mg total) by mouth every Monday, Wednesday, and Friday., Disp: 12 tablet, Rfl: 6 .  traMADol (ULTRAM) 50 MG tablet, Take 1 tablet (50 mg total) by mouth every 6 (six) hours as needed., Disp: 20 tablet, Rfl: 0 .  Vitamin D, Ergocalciferol, (DRISDOL) 50000 units CAPS capsule, Take 1 capsule by mouth 2 (two) times a  week. Tuesdays and Fridays, Disp: , Rfl: 2 .  ezetimibe (ZETIA) 10 MG tablet, Take 1 tablet (10 mg total) by mouth daily. (Patient not taking: Reported on 10/10/2018), Disp: 90 tablet, Rfl: 3   Allergies  Allergen Reactions  . Amoxicillin Hives  . Codeine Hives     Review of Systems  Constitutional: Negative.   Eyes: Negative for blurred vision.  Respiratory: Negative.  Negative for shortness of breath.   Cardiovascular: Negative.  Negative for chest pain and palpitations.  Gastrointestinal: Negative.   Neurological: Negative.   Psychiatric/Behavioral: Negative.      Today's Vitals   10/10/18 1509  BP: 112/64   Pulse: 94  Temp: 98.3 F (36.8 C)  TempSrc: Oral  Weight: 229 lb 12.8 oz (104.2 kg)  Height: 5\' 5"  (1.651 m)   Body mass index is 38.24 kg/m.   Objective:  Physical Exam Vitals signs and nursing note reviewed.  Constitutional:      Appearance: Normal appearance.  HENT:     Head: Normocephalic and atraumatic.  Cardiovascular:     Rate and Rhythm: Normal rate and regular rhythm.     Heart sounds: Normal heart sounds.  Pulmonary:     Effort: Pulmonary effort is normal.     Breath sounds: Normal breath sounds.  Skin:    General: Skin is warm.  Neurological:     General: No focal deficit present.     Mental Status: She is alert.  Psychiatric:        Mood and Affect: Mood normal.        Behavior: Behavior normal.         Assessment And Plan:     1. Type 2 diabetes mellitus with stage 2 chronic kidney disease, without long-term current use of insulin (HCC)  Chronic, importance of dietary compliance was discussed with the patient.  She is encouraged to avoid sugary beverages and processed foods.  - Hemoglobin A1c  2. Hypertensive nephropathy  Chronic, well controlled. She will continue with current meds.  She is encouraged to avoid adding salt to her foods.   3. Class 2 severe obesity due to excess calories with serious comorbidity and body mass index (BMI) of 38.0 to 38.9 in adult [facility])  She is encouraged to strive to lose ten percent of her body weight to decrease cardiac risk. She is encouraged to exercise 30 minutes five days weekly.   [facility], MD    THE PATIENT IS ENCOURAGED TO PRACTICE SOCIAL DISTANCING DUE TO THE COVID-19 PANDEMIC.

## 2018-11-06 ENCOUNTER — Other Ambulatory Visit: Payer: Self-pay | Admitting: Rheumatology

## 2018-11-06 DIAGNOSIS — M0579 Rheumatoid arthritis with rheumatoid factor of multiple sites without organ or systems involvement: Secondary | ICD-10-CM

## 2018-11-06 NOTE — Telephone Encounter (Addendum)
Last Visit: 09/14/18 Next Visit: 11/22/18 Labs: 09/12/18 Eye exam: no eye exam on file  Patient states she has had her PLQ eye exam and will have the results faxed to out office   Okay to refill 30 day supply PLQ?

## 2018-11-08 NOTE — Telephone Encounter (Signed)
Please call to obtain PLQ eye exam.

## 2018-11-08 NOTE — Progress Notes (Signed)
Office Visit Note  Patient: Ashley Monroe             Date of Birth: 1956/02/08           MRN: [id number]             PCP: Glendale Chard, MD Referring: Glendale Chard, MD Visit Date: 11/22/2018 Occupation: '@GUAROCC'$ @  Subjective:  Pain and swelling in multiple joints.  History of Present Illness: VALIA WINGARD is a 63 y.o. female with seropositive rheumatoid arthritis.  She has been on Plaquenil since July 2020.  She has noticed improvement on the Plaquenil.  Although she still continues to have some joint discomfort.  She reports pain and stiffness in her bilateral hands and some swelling in her right ankle.  None of the other joints are painful.  Activities of Daily Living:  Patient reports morning stiffness for 45 minutes.   Patient Reports nocturnal pain.  Difficulty dressing/grooming: Denies Difficulty climbing stairs: Reports Difficulty getting out of chair: Reports Difficulty using hands for taps, buttons, cutlery, and/or writing: Reports  Review of Systems  Constitutional: Negative for fatigue, night sweats, weight gain and weight loss.  HENT: Negative for mouth sores, trouble swallowing, trouble swallowing, mouth dryness and nose dryness.   Eyes: Negative for pain, redness, itching, visual disturbance and dryness.  Respiratory: Negative for cough, shortness of breath, wheezing and difficulty breathing.   Cardiovascular: Negative for chest pain, palpitations, hypertension, irregular heartbeat and swelling in legs/feet.  Gastrointestinal: Negative for abdominal pain, blood in stool, constipation and diarrhea.  Endocrine: Negative for increased urination.  Genitourinary: Negative for difficulty urinating, painful urination and vaginal dryness.  Musculoskeletal: Positive for arthralgias, joint pain, joint swelling and morning stiffness. Negative for myalgias, muscle weakness, muscle tenderness and myalgias.  Skin: Negative for color change, rash, hair loss, skin  tightness, ulcers and sensitivity to sunlight.  Allergic/Immunologic: Negative for susceptible to infections.  Neurological: Negative for dizziness, light-headedness, numbness, headaches, memory loss, night sweats and weakness.  Hematological: Negative for bruising/bleeding tendency and swollen glands.  Psychiatric/Behavioral: Negative for depressed mood, confusion and sleep disturbance. The patient is nervous/anxious.     PMFS History:  Patient Active Problem List   Diagnosis Date Noted  . Hyperlipidemia 08/19/2016  . Chest pain 08/12/2016  . Chronic kidney disease, stage 2 (mild) 08/12/2016  . 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 08/12/2016  . Other long term (current) drug therapy 08/12/2016  . Type 2 diabetes mellitus with diabetic chronic kidney disease ([place]) 08/12/2016    Past Medical History:  Diagnosis Date  . Allergy   . Diabetes mellitus without complication ([place])   . Hyperlipidemia 08/19/2016  . Hypertension     Family History  Problem Relation Age of Onset  . Atrial fibrillation Mother   . Stroke Mother   . Heart attack Mother   . Aneurysm Mother   . Atrial fibrillation Brother   . Stroke Brother   . Diabetes Brother   . Diabetes Father   . Alzheimer's disease Father   . Diabetes Brother   . Alzheimer's disease Maternal Grandmother   . Heart attack Maternal Grandfather   . Cancer Paternal Grandmother   . Healthy Son   . Healthy Daughter    Past Surgical History:  Procedure Laterality Date  . ABDOMINAL HYSTERECTOMY  2003  . CERVICAL SPINE SURGERY  08/2017  . CHOLECYSTECTOMY  Young   Social History   Social History Narrative  . Not on file   Immunization History  Administered Date(s) Administered  . DTaP 05/02/2012  . Influenza-Unspecified 11/30/2017, 11/10/2018  .  Zoster 12/01/2016     Objective: Vital Signs: BP (!) 144/91 (BP Location: Left Arm, Patient Position: Sitting, Cuff Size: Normal)   Pulse 81   Resp 15   Ht '5\' 5"'$  (1.651 m)   Wt 230 lb (104.3 kg)   BMI 38.27 kg/m    Physical Exam Vitals signs and nursing note reviewed.  Constitutional:      Appearance: She is well-developed.  HENT:     Head: Normocephalic and atraumatic.  Eyes:     Conjunctiva/sclera: Conjunctivae normal.  Neck:     Musculoskeletal: Normal range of motion.  Cardiovascular:     Rate and Rhythm: Normal rate and regular rhythm.     Heart sounds: Normal heart sounds.  Pulmonary:     Effort: Pulmonary effort is normal.     Breath sounds: Normal breath sounds.  Abdominal:     General: Bowel sounds are normal.     Palpations: Abdomen is soft.  Lymphadenopathy:     Cervical: No cervical adenopathy.  Skin:    General: Skin is warm and dry.     Capillary Refill: Capillary refill takes less than 2 seconds.  Neurological:     Mental Status: She is alert and oriented to person, place, and time.  Psychiatric:        Behavior: Behavior normal.      Musculoskeletal Exam: C-spine was in good range of motion.  She had good range of motion of bilateral shoulder joints.  She has contractures in bilateral elbow joints without synovitis.  She has synovitis in her right wrist joint and some of the MCPs and PIPs as described below.  She has warmth swelling and effusion in her right knee joint.  She has swelling in her right ankle joint.  CDAI Exam: CDAI Score: 11.2  Patient Global: 6 mm; Provider Global: 6 mm Swollen: 4 ; Tender: 8  Joint Exam      Right  Left  Wrist  Swollen Tender     MCP 2   Tender   Tender  MCP 3  Swollen Tender   Tender  PIP 2      Tender  Knee  Swollen Tender     Ankle  Swollen Tender        Investigation: No additional findings.  Imaging: No results found.  Recent Labs: Lab Results  Component Value Date   WBC 5.3 09/12/2018   HGB  13.9 09/12/2018   PLT 315 09/12/2018   NA 140 09/12/2018   K 4.4 09/12/2018   CL 102 09/12/2018   CO2 29 09/12/2018   GLUCOSE 150 (H) 09/12/2018   BUN 29 (H) 09/12/2018   CREATININE 1.01 (H) 09/12/2018   BILITOT 0.4 09/12/2018   ALKPHOS 115 01/11/2018   AST 14 09/12/2018   ALT 17 09/12/2018   PROT 7.2 09/12/2018   ALBUMIN 4.7 01/11/2018   CALCIUM 10.3 09/12/2018   GFRAA 70 09/12/2018   QFTBGOLDPLUS NEGATIVE 07/18/2018    Speciality Comments: No specialty comments available.  Procedures:  No procedures performed Allergies: Amoxicillin and Codeine   Assessment / Plan:     Visit Diagnoses: Rheumatoid arthritis involving multiple sites with positive rheumatoid factor (HCC) - Positive RF, negative anti-CCP, -'14 3 3 '$ eta, ESR 57.  X-ray  showed MCP narrowing.  She was a started on Plaquenil about 3 months ago.  She continues to have a lot of warmth and swelling in her joints.  She has synovitis in multiple joints as described above.  Different treatment options and their side effects were discussed.  After reviewing indications side effects contraindications she was willing to proceed with Arava.  The plan is to start her on Arava 20 mg p.o. daily.  We will check labs in 2 weeks and then 2 months.  If she has an adequate response to [place] then we may switch her to [employer].  I have advised her to continue with Plaquenil for now.  High risk medication use - Plaquenil 200 mg 1 tablet twice daily in July 2020.  Patient states that she had eye exam mentation done by Dr. Bing Plume.  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  Primary osteoarthritis of both feet-she has some discomfort in her feet.  She is swelling of the right ankle joint.  DDD (degenerative disc disease), lumbar-chronic pain.  DDD (degenerative disc disease), cervical -  She is followed by Dr. Nelva Bush.   Idiopathic chronic gout of multiple sites without tophus - She takes colchicine 0.6 mg 1 capsule by mouth daily as needed during a flare.  Her uric acid is only mildly elevated.  History of hyperlipidemia  History of type 2 diabetes mellitus  Chronic kidney disease, stage 2 (mild)  Orders: No orders of the defined types were placed in this encounter.  No orders of the defined types were placed in this encounter.   Face-to-face time spent with patient was 30 minutes. Greater than 50% of time was spent in counseling and coordination of care.  Follow-Up Instructions: Return in about 6 weeks (around 01/03/2019) for Rheumatoid arthritis.   Bo Merino, MD  Note - This record has been created using Editor, commissioning.  Chart creation errors have been sought, but may not always  have been located. Such creation errors do not reflect on  the standard of medical care.

## 2018-11-13 NOTE — Telephone Encounter (Signed)
Attempted to contact the patient and left message for patient to call the office.  

## 2018-11-15 ENCOUNTER — Encounter: Payer: Self-pay | Admitting: Internal Medicine

## 2018-11-22 ENCOUNTER — Other Ambulatory Visit: Payer: Self-pay

## 2018-11-22 ENCOUNTER — Encounter: Payer: Self-pay | Admitting: Rheumatology

## 2018-11-22 ENCOUNTER — Ambulatory Visit: Payer: BC Managed Care – PPO | Admitting: Rheumatology

## 2018-11-22 VITALS — BP 144/91 | HR 81 | Resp 15 | Ht 65.0 in | Wt 230.0 lb

## 2018-11-22 DIAGNOSIS — M170 Bilateral primary osteoarthritis of knee: Secondary | ICD-10-CM

## 2018-11-22 DIAGNOSIS — M19071 Primary osteoarthritis, right ankle and foot: Secondary | ICD-10-CM

## 2018-11-22 DIAGNOSIS — M19072 Primary osteoarthritis, left ankle and foot: Secondary | ICD-10-CM

## 2018-11-22 DIAGNOSIS — Z79899 Other long term (current) drug therapy: Secondary | ICD-10-CM

## 2018-11-22 DIAGNOSIS — Z8639 Personal history of other endocrine, nutritional and metabolic disease: Secondary | ICD-10-CM

## 2018-11-22 DIAGNOSIS — M5030 Other cervical disc degeneration, unspecified cervical region: Secondary | ICD-10-CM

## 2018-11-22 DIAGNOSIS — M24521 Contracture, right elbow: Secondary | ICD-10-CM

## 2018-11-22 DIAGNOSIS — M1A09X0 Idiopathic chronic gout, multiple sites, without tophus (tophi): Secondary | ICD-10-CM

## 2018-11-22 DIAGNOSIS — M5136 Other intervertebral disc degeneration, lumbar region: Secondary | ICD-10-CM

## 2018-11-22 DIAGNOSIS — M0579 Rheumatoid arthritis with rheumatoid factor of multiple sites without organ or systems involvement: Secondary | ICD-10-CM

## 2018-11-22 DIAGNOSIS — N182 Chronic kidney disease, stage 2 (mild): Secondary | ICD-10-CM

## 2018-11-22 NOTE — Progress Notes (Signed)
Pharmacy Note  Subjective: Patient presents today to the [facility] to see Dr. Corliss Skains.  Patient seen by the pharmacist for counseling on leflunomide [name]) for rheumatoid arthritis.  She is currently on Plaquenil with inadequate response.  Objective: CBC    Component Value Date/Time   WBC 5.3 09/12/2018 1622   RBC 5.27 (H) 09/12/2018 1622   HGB 13.9 09/12/2018 1622   HGB 13.1 12/20/2017 1024   HCT 43.0 09/12/2018 1622   HCT 39.4 12/20/2017 1024   PLT 315 09/12/2018 1622   PLT 281 12/20/2017 1024   MCV 81.6 09/12/2018 1622   MCV 81 12/20/2017 1024   MCH 26.4 (L) 09/12/2018 1622   MCHC 32.3 09/12/2018 1622   RDW 14.1 09/12/2018 1622   RDW 14.1 12/20/2017 1024   LYMPHSABS 2,306 09/12/2018 1622   EOSABS 148 09/12/2018 1622   BASOSABS 69 09/12/2018 1622    CMP     Component Value Date/Time   NA 140 09/12/2018 1622   NA 144 09/04/2018 1625   K 4.4 09/12/2018 1622   CL 102 09/12/2018 1622   CO2 29 09/12/2018 1622   GLUCOSE 150 (H) 09/12/2018 1622   BUN 29 (H) 09/12/2018 1622   BUN 13 09/04/2018 1625   CREATININE 1.01 (H) 09/12/2018 1622   CALCIUM 10.3 09/12/2018 1622   PROT 7.2 09/12/2018 1622   PROT 7.0 01/11/2018 1059   ALBUMIN 4.7 01/11/2018 1059   AST 14 09/12/2018 1622   ALT 17 09/12/2018 1622   ALKPHOS 115 01/11/2018 1059   BILITOT 0.4 09/12/2018 1622   BILITOT 0.4 01/11/2018 1059   GFRNONAA 60 09/12/2018 1622   GFRAA 70 09/12/2018 1622    Baseline Immunosuppressant Therapy Labs Quantiferon TB Gold Latest Ref Rng & Units 07/18/2018  Quantiferon TB Gold Plus NEGATIVE NEGATIVE    Hepatitis Latest Ref Rng & Units 07/18/2018  Hep B Surface Ag NON-REACTI NON-REACTIVE  Hep B IgM NON-REACTI NON-REACTIVE  Hep C Ab NON-REACTI NON-REACTIVE  Hep C Ab NON-REACTI NON-REACTIVE    Lab Results  Component Value Date   HIV NON-REACTIVE 07/18/2018    Immunoglobulin Electrophoresis Latest Ref Rng & Units 07/18/2018  IgA  70 - 320 mg/dL 607  IgG 371 -  0,626 mg/dL 948  IgM 50 - 546 mg/dL 84    Serum Protein Electrophoresis Latest Ref Rng & Units 09/12/2018  Total Protein 6.1 - 8.1 g/dL 7.2  Albumin 3.8 - 4.8 g/dL -  Alpha-1 0.2 - 0.3 g/dL -  Alpha-2 0.5 - 0.9 g/dL -  Beta Globulin 0.4 - 0.6 g/dL -  Beta 2 0.2 - 0.5 g/dL -  Gamma Globulin 0.8 - 1.7 g/dL -    Lab Results  Component Value Date   G6PDH 13.7 07/18/2018    No results found for: TPMT   Pregnancy status:  hysterectomy  Assessment/Plan:  Patient was counseled on the purpose, proper use, and adverse effects of leflunomide including risk of infection, nausea/diarrhea/weight loss, increase in blood pressure, rash, hair loss, tingling in the hands and feet, and signs and symptoms of interstitial lung disease.   Also counseled on Black Box warning of liver injury and importance of avoiding alcohol while on therapy. Discussed that there is the possibility of an increased risk of malignancy but it is not well understood if this increased risk is due to the medication or the disease state.  Counseled patient to avoid live vaccines. Recommend annual influenza, Pneumovax 23, Prevnar 13, and Shingrix as indicated.   Discussed the  importance of frequent monitoring of liver function and blood count.  Standing orders placed.  Discussed importance of birth control while on leflunomide due to risk of congenital abnormalities, and patient confirms she had a hysterectomy.  Provided patient with educational materials on leflunomide and answered all questions.  Patient consented to [place] use, and consent will be uploaded into the media tab.    Patient dose will be Arava 20 mg.  Prescription pending chest x-ray.  She was given written prescription to obtain at her PCP office.  She is to continue Plaquenil.  All questions encouraged and answered.  Instructed patient to call with any other questions or concerns.  Mariella Saa, PharmD, [place], [facility]  Pharmacist [phone]  11/22/2018 4:01 PM     .

## 2018-11-22 NOTE — Patient Instructions (Signed)
Standing Labs We placed an order today for your standing lab work.    Please come back and get your standing labs in 2 weeks, 4 weeks, 8 weeks, then every 3 months.  We have open lab daily Monday through Thursday from 8:30-12:30 PM and 1:30-4:30 PM and Friday from 8:30-12:30 PM and 1:30-4:00 PM at the office of Dr. Bo Merino.   You may experience shorter wait times on Monday and Friday afternoons. The office is located at [address] [place], [place], [place] [address] No appointment is necessary.   Labs are drawn by [facility].  You may receive a bill from [place] for your lab work.  If you wish to have your labs drawn at another location, please call the office 24 hours in advance to send orders.  If you have any questions regarding directions or hours of operation,  please call [phone].   Just as a reminder please drink plenty of water prior to coming for your lab work. Thanks!  Vaccines You are taking a medication(s) that can suppress your immune system.  The following immunizations are recommended: . Flu annually . Pneumonia (Pneumovax 23 and Prevnar 13 spaced at least 1 year apart) . Shingrix  Please check with your PCP to make sure you are up to date.  Leflunomide tablets What is this medicine? LEFLUNOMIDE (le FLOO na mide) is for rheumatoid arthritis. This medicine may be used for other purposes; ask your health care provider or pharmacist if you have questions. COMMON BRAND NAME(S): Arava What should I tell my health care provider before I take this medicine? They need to know if you have any of these conditions:  diabetes  have a fever or infection  high blood pressure  immune system problems  kidney disease  liver disease  low blood cell counts, like low white cell, platelet, or red cell counts  lung or breathing disease, like asthma  recently received or scheduled to receive a vaccine  receiving treatment for cancer  skin conditions or  sensitivity  tingling of the fingers or toes, or other nerve disorder  tuberculosis  an unusual or allergic reaction to leflunomide, teriflunomide, other medicines, food, dyes, or preservatives  pregnant or trying to get pregnant  breast-feeding How should I use this medicine? Take this medicine by mouth with a full glass of water. Follow the directions on the prescription label. Take your medicine at regular intervals. Do not take your medicine more often than directed. Do not stop taking except on your doctor's advice. Talk to your pediatrician regarding the use of this medicine in children. Special care may be needed. Overdosage: If you think you have taken too much of this medicine contact a poison control center or emergency room at once. NOTE: This medicine is only for you. Do not share this medicine with others. What if I miss a dose? If you miss a dose, take it as soon as you can. If it is almost time for your next dose, take only that dose. Do not take double or extra doses. What may interact with this medicine? Do not take this medicine with any of the following medications:  teriflunomide This medicine may also interact with the following medications:  alosetron  birth control pills  caffeine  cefaclor  certain medicines for diabetes like nateglinide, repaglinide, rosiglitazone, pioglitazone  certain medicines for high cholesterol like atorvastatin, pravastatin, rosuvastatin, simvastatin  charcoal  cholestyramine  ciprofloxacin  duloxetine  furosemide  ketoprofen  live virus vaccines  medicines that increase your risk for infection  methotrexate  mitoxantrone  paclitaxel  penicillin  theophylline  tizanidine  warfarin This list may not describe all possible interactions. Give your health care provider a list of all the medicines, herbs, non-prescription drugs, or dietary supplements you use. Also tell them if you smoke, drink alcohol, or use  illegal drugs. Some items may interact with your medicine. What should I watch for while using this medicine? Visit your health care provider for regular checks on your progress. Tell your doctor or health care provider if your symptoms do not start to get better or if they get worse. You may need blood work done while you are taking this medicine. This medicine may cause serious skin reactions. They can happen weeks to months after starting the medicine. Contact your health care provider right away if you notice fevers or flu-like symptoms with a rash. The rash may be red or purple and then turn into blisters or peeling of the skin. Or, you might notice a red rash with swelling of the face, lips or lymph nodes in your neck or under your arms. This medicine may stay in your body for up to 2 years after your last dose. Tell your doctor about any unusual side effects or symptoms. A medicine can be given to help lower your blood levels of this medicine more quickly. Women must use effective birth control with this medicine. There is a potential for serious side effects to an unborn child. Do not become pregnant while taking this medicine. Inform your doctor if you wish to become pregnant. This medicine remains in your blood after you stop taking it. You must continue using effective birth control until the blood levels have been checked and they are low enough. A medicine can be given to help lower your blood levels of this medicine more quickly. Immediately talk to your doctor if you think you may be pregnant. You may need a pregnancy test. Talk to your health care provider or pharmacist for more information. You should not receive certain vaccines during your treatment and for a certain time after your treatment with this medication ends. Talk to your health care provider for more information. What side effects may I notice from receiving this medicine? Side effects that you should report to your doctor or  health care professional as soon as possible:  allergic reactions like skin rash, itching or hives, swelling of the face, lips, or tongue  breathing problems  cough  increased blood pressure  low blood counts - this medicine may decrease the number of white blood cells and platelets. You may be at increased risk for infections and bleeding.  pain, tingling, numbness in the hands or feet  rash, fever, and swollen lymph nodes  redness, blistering, peeing or loosening of the skin, including inside the mouth  signs of decreased platelets or bleeding - bruising, pinpoint red spots on the skin, black, tarry stools, blood in urine  signs of infection - fever or chills, cough, sore throat, pain or trouble passing urine  signs and symptoms of liver injury like dark yellow or brown urine; general ill feeling or flu-like symptoms; light-colored stools; loss of appetite; nausea; right upper belly pain; unusually weak or tired; yellowing of the eyes or skin  trouble passing urine or change in the amount of urine  vomiting Side effects that usually do not require medical attention (report to your doctor or health care professional if they continue or are  bothersome):  diarrhea  hair thinning or loss  headache  nausea  tiredness This list may not describe all possible side effects. Call your doctor for medical advice about side effects. You may report side effects to FDA at 1-800-FDA-1088. Where should I keep my medicine? Keep out of the reach of children. Store at room temperature between 15 and 30 degrees C (59 and 86 degrees F). Protect from moisture and light. Throw away any unused medicine after the expiration date. NOTE: This sheet is a summary. It may not cover all possible information. If you have questions about this medicine, talk to your doctor, pharmacist, or health care provider.  2020 Elsevier/Gold Standard (2018-04-28 15:06:48)

## 2018-12-04 ENCOUNTER — Telehealth: Payer: Self-pay

## 2018-12-04 NOTE — Telephone Encounter (Signed)
I called patient to see if she has rescheduled her diabetic eye exam per Dr.Sanders. patient stated she had one done on 10/09/18. YRL,RMA

## 2018-12-08 DIAGNOSIS — G5601 Carpal tunnel syndrome, right upper limb: Secondary | ICD-10-CM | POA: Insufficient documentation

## 2018-12-20 NOTE — Progress Notes (Deleted)
Office Visit Note  Patient: Ashley Monroe             Date of Birth: Sep 13, 1955           MRN: [id number]             PCP: Dorothyann Peng, MD Referring: Dorothyann Peng, MD Visit Date: 01/03/2019 Occupation: @GUAROCC @  Subjective:  No chief complaint on file.   History of Present Illness: Ashley Monroe is a 63 y.o. female ***   Activities of Daily Living:  Patient reports morning stiffness for *** {minute/hour:19697}.   Patient {ACTIONS;DENIES/REPORTS:21021675::"Denies"} nocturnal pain.  Difficulty dressing/grooming: {ACTIONS;DENIES/REPORTS:21021675::"Denies"} Difficulty climbing stairs: {ACTIONS;DENIES/REPORTS:21021675::"Denies"} Difficulty getting out of chair: {ACTIONS;DENIES/REPORTS:21021675::"Denies"} Difficulty using hands for taps, buttons, cutlery, and/or writing: {ACTIONS;DENIES/REPORTS:21021675::"Denies"}  No Rheumatology ROS completed.   PMFS History:  Patient Active Problem List   Diagnosis Date Noted  . Hyperlipidemia 08/19/2016  . Chest pain 08/12/2016  . Chronic kidney disease, stage 2 (mild) 08/12/2016  . 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 08/12/2016  . Other long term (current) drug therapy 08/12/2016  . Type 2 diabetes mellitus with diabetic chronic kidney disease (HCC) 08/12/2016    Past Medical History:  Diagnosis Date  . Allergy   . Diabetes mellitus without complication (HCC)   . Hyperlipidemia 08/19/2016  . Hypertension     Family History  Problem Relation Age of Onset  . Atrial fibrillation Mother   . Stroke Mother   . Heart attack Mother   . Aneurysm Mother   . Atrial fibrillation Brother   . Stroke Brother   . Diabetes Brother   . Diabetes Father   . Alzheimer's disease Father   . Diabetes Brother   . Alzheimer's disease Maternal Grandmother   . Heart attack Maternal  Grandfather   . Cancer Paternal Grandmother   . Healthy Son   . Healthy Daughter    Past Surgical History:  Procedure Laterality Date  . ABDOMINAL HYSTERECTOMY  2003  . CERVICAL SPINE SURGERY  08/2017  . CHOLECYSTECTOMY  1982  . OVARIAN CYST REMOVAL  1981   Social History   Social History Narrative  . Not on file   Immunization History  Administered Date(s) Administered  . DTaP 05/02/2012  . Influenza-Unspecified 11/30/2017, 11/10/2018  . Zoster 12/01/2016     Objective: Vital Signs: There were no vitals taken for this visit.   Physical Exam   Musculoskeletal Exam: ***  CDAI Exam: CDAI Score: - Patient Global: -; Provider Global: - Swollen: -; Tender: - Joint Exam   No joint exam has been documented for this visit   There is currently no information documented on the homunculus. Go to the Rheumatology activity and complete the homunculus joint exam.  Investigation: No additional findings.  Imaging: No results found.  Recent Labs: Lab Results  Component Value Date   WBC 5.3 09/12/2018   HGB 13.9 09/12/2018   PLT 315 09/12/2018   NA 140 09/12/2018   K 4.4 09/12/2018   CL 102 09/12/2018   CO2 29 09/12/2018   GLUCOSE 150 (H) 09/12/2018   BUN 29 (H) 09/12/2018   CREATININE 1.01 (H) 09/12/2018   BILITOT 0.4 09/12/2018   ALKPHOS 115 01/11/2018   AST 14 09/12/2018   ALT 17 09/12/2018   PROT 7.2 09/12/2018   ALBUMIN 4.7 01/11/2018   CALCIUM 10.3 09/12/2018   GFRAA 70 09/12/2018  [facility] NEGATIVE 07/18/2018    Speciality Comments: PLQ eye exam: 10/09/2018 normal. [facility]. Follow up in 1 year.  Procedures:  No procedures performed Allergies: Amoxicillin and Codeine   Assessment / Plan:     Visit Diagnoses: No diagnosis found.  Orders: No orders of the defined types were placed in this encounter.  No orders of the defined types were placed in this encounter.   Face-to-face time spent with patient was *** minutes. Greater than  50% of time was spent in counseling and coordination of care.  Follow-Up Instructions: No follow-ups on file.   Earnestine Mealing, CMA  Note - This record has been created using Editor, commissioning.  Chart creation errors have been sought, but may not always  have been located. Such creation errors do not reflect on  the standard of medical care.

## 2018-12-26 ENCOUNTER — Encounter: Payer: Self-pay | Admitting: Internal Medicine

## 2018-12-26 ENCOUNTER — Ambulatory Visit (INDEPENDENT_AMBULATORY_CARE_PROVIDER_SITE_OTHER): Payer: BC Managed Care – PPO | Admitting: Internal Medicine

## 2018-12-26 ENCOUNTER — Other Ambulatory Visit: Payer: Self-pay

## 2018-12-26 VITALS — BP 116/78 | HR 85 | Temp 98.2°F | Ht 65.0 in | Wt 228.6 lb

## 2018-12-26 DIAGNOSIS — E1122 Type 2 diabetes mellitus with diabetic chronic kidney disease: Secondary | ICD-10-CM

## 2018-12-26 DIAGNOSIS — I129 Hypertensive chronic kidney disease with stage 1 through stage 4 chronic kidney disease, or unspecified chronic kidney disease: Secondary | ICD-10-CM | POA: Diagnosis not present

## 2018-12-26 DIAGNOSIS — Z0000 Encounter for general adult medical examination without abnormal findings: Secondary | ICD-10-CM | POA: Diagnosis not present

## 2018-12-26 DIAGNOSIS — Z6838 Body mass index (BMI) 38.0-38.9, adult: Secondary | ICD-10-CM

## 2018-12-26 DIAGNOSIS — N182 Chronic kidney disease, stage 2 (mild): Secondary | ICD-10-CM | POA: Diagnosis not present

## 2018-12-26 LAB — POCT URINALYSIS DIPSTICK
Bilirubin, UA: NEGATIVE
Blood, UA: NEGATIVE
Glucose, UA: NEGATIVE
Ketones, UA: NEGATIVE
Nitrite, UA: NEGATIVE
Protein, UA: NEGATIVE
Spec Grav, UA: 1.025 (ref 1.010–1.025)
Urobilinogen, UA: 0.2 E.U./dL
pH, UA: 5 (ref 5.0–8.0)

## 2018-12-26 LAB — POCT UA - MICROALBUMIN
Albumin/Creatinine Ratio, Urine, POC: 30
Creatinine, POC: 100 mg/dL
Microalbumin Ur, POC: 10 mg/L

## 2018-12-26 NOTE — Patient Instructions (Signed)
Health Maintenance, Female Adopting a healthy lifestyle and getting preventive care are important in promoting health and wellness. Ask your health care provider about:  The right schedule for you to have regular tests and exams.  Things you can do on your own to prevent diseases and keep yourself healthy. What should I know about diet, weight, and exercise? Eat a healthy diet   Eat a diet that includes plenty of vegetables, fruits, low-fat dairy products, and lean protein.  Do not eat a lot of foods that are high in solid fats, added sugars, or sodium. Maintain a healthy weight Body mass index (BMI) is used to identify weight problems. It estimates body fat based on height and weight. Your health care provider can help determine your BMI and help you achieve or maintain a healthy weight. Get regular exercise Get regular exercise. This is one of the most important things you can do for your health. Most adults should:  Exercise for at least 150 minutes each week. The exercise should increase your heart rate and make you sweat (moderate-intensity exercise).  Do strengthening exercises at least twice a week. This is in addition to the moderate-intensity exercise.  Spend less time sitting. Even light physical activity can be beneficial. Watch cholesterol and blood lipids Have your blood tested for lipids and cholesterol at 62 years of age, then have this test every 5 years. Have your cholesterol levels checked more often if:  Your lipid or cholesterol levels are high.  You are older than 62 years of age.  You are at high risk for heart disease. What should I know about cancer screening? Depending on your health history and family history, you may need to have cancer screening at various ages. This may include screening for:  Breast cancer.  Cervical cancer.  Colorectal cancer.  Skin cancer.  Lung cancer. What should I know about heart disease, diabetes, and high blood  pressure? Blood pressure and heart disease  High blood pressure causes heart disease and increases the risk of stroke. This is more likely to develop in people who have high blood pressure readings, are of African descent, or are overweight.  Have your blood pressure checked: ? Every 3-5 years if you are 18-39 years of age. ? Every year if you are 40 years old or older. Diabetes Have regular diabetes screenings. This checks your fasting blood sugar level. Have the screening done:  Once every three years after age 40 if you are at a normal weight and have a low risk for diabetes.  More often and at a younger age if you are overweight or have a high risk for diabetes. What should I know about preventing infection? Hepatitis B If you have a higher risk for hepatitis B, you should be screened for this virus. Talk with your health care provider to find out if you are at risk for hepatitis B infection. Hepatitis C Testing is recommended for:  Everyone born from 1945 through 1965.  Anyone with known risk factors for hepatitis C. Sexually transmitted infections (STIs)  Get screened for STIs, including gonorrhea and chlamydia, if: ? You are sexually active and are younger than 62 years of age. ? You are older than 62 years of age and your health care provider tells you that you are at risk for this type of infection. ? Your sexual activity has changed since you were last screened, and you are at increased risk for chlamydia or gonorrhea. Ask your health care provider if   you are at risk.  Ask your health care provider about whether you are at high risk for HIV. Your health care provider may recommend a prescription medicine to help prevent HIV infection. If you choose to take medicine to prevent HIV, you should first get tested for HIV. You should then be tested every 3 months for as long as you are taking the medicine. Pregnancy  If you are about to stop having your period (premenopausal) and  you may become pregnant, seek counseling before you get pregnant.  Take 400 to 800 micrograms (mcg) of folic acid every day if you become pregnant.  Ask for birth control (contraception) if you want to prevent pregnancy. Osteoporosis and menopause Osteoporosis is a disease in which the bones lose minerals and strength with aging. This can result in bone fractures. If you are 65 years old or older, or if you are at risk for osteoporosis and fractures, ask your health care provider if you should:  Be screened for bone loss.  Take a calcium or vitamin D supplement to lower your risk of fractures.  Be given hormone replacement therapy (HRT) to treat symptoms of menopause. Follow these instructions at home: Lifestyle  Do not use any products that contain nicotine or tobacco, such as cigarettes, e-cigarettes, and chewing tobacco. If you need help quitting, ask your health care provider.  Do not use street drugs.  Do not share needles.  Ask your health care provider for help if you need support or information about quitting drugs. Alcohol use  Do not drink alcohol if: ? Your health care provider tells you not to drink. ? You are pregnant, may be pregnant, or are planning to become pregnant.  If you drink alcohol: ? Limit how much you use to 0-1 drink a day. ? Limit intake if you are breastfeeding.  Be aware of how much alcohol is in your drink. In the [place] one drink equals one 12 oz bottle of beer (355 mL), one 5 oz glass of wine (148 mL), or one 1 oz glass of hard liquor (44 mL). General instructions  Schedule regular health, dental, and eye exams.  Stay current with your vaccines.  Tell your health care provider if: ? You often feel depressed. ? You have ever been abused or do not feel safe at home. Summary  Adopting a healthy lifestyle and getting preventive care are important in promoting health and wellness.  Follow your health care provider's instructions about healthy  diet, exercising, and getting tested or screened for diseases.  Follow your health care provider's instructions on monitoring your cholesterol and blood pressure. This information is not intended to replace advice given to you by your health care provider. Make sure you discuss any questions you have with your health care provider. Document Released: 08/10/2010 Document Revised: 01/18/2018 Document Reviewed: 01/18/2018 Elsevier Patient Education  2020 Elsevier Inc.  

## 2018-12-26 NOTE — Progress Notes (Signed)
Subjective:     Patient ID: Ashley Monroe , female    DOB: 1955/03/11 , 63 y.o.   MRN: [id number]   Chief Complaint  Patient presents with  . Annual Exam  . Diabetes  . Hypertension    HPI  She is here today for a full physical examination.  She is followed by Dr. Garwin Brothers for her Gyn care. She is scheduled December 2020 for her next pelvic exam and mammogram.  She is s/p hysterectomy.   Diabetes She presents for her follow-up diabetic visit. She has type 2 diabetes mellitus. Her disease course has been stable. There are no hypoglycemic associated symptoms. Pertinent negatives for diabetes include no blurred vision and no chest pain. There are no hypoglycemic complications. Risk factors for coronary artery disease include diabetes mellitus, dyslipidemia, hypertension, obesity and sedentary lifestyle. She is following a generally healthy diet. She never participates in exercise. Her breakfast blood glucose is taken between 7-8 am. Her breakfast blood glucose range is generally 130-140 mg/dl. An ACE inhibitor/angiotensin II receptor blocker is being taken. Eye exam is current.  Hypertension This is a chronic problem. The current episode started more than 1 year ago. The problem has been gradually improving since onset. The problem is controlled. Pertinent negatives include no blurred vision, chest pain, palpitations or shortness of breath. Risk factors for coronary artery disease include obesity, post-menopausal state, sedentary lifestyle, dyslipidemia and diabetes mellitus. Past treatments include angiotensin blockers.     Past Medical History:  Diagnosis Date  . Allergy   . Diabetes mellitus without complication ([place])   . Hyperlipidemia 08/19/2016  . Hypertension      Family History  Problem Relation Age of Onset  . Atrial fibrillation Mother   . Stroke Mother   . Heart attack Mother   . Aneurysm Mother   . Atrial fibrillation Brother   . Stroke Brother   . Diabetes Brother    . Diabetes Father   . Alzheimer's disease Father   . Diabetes Brother   . Alzheimer's disease Maternal Grandmother   . Heart attack Maternal Grandfather   . Cancer Paternal Grandmother   . Healthy Son   . Healthy Daughter      Current Outpatient Medications:  .  amLODipine (NORVASC) 10 MG tablet, Take 1 tablet (10 mg total) by mouth daily., Disp: 90 tablet, Rfl: 1 .  aspirin EC 81 MG tablet, Take 81 mg by mouth daily., Disp: , Rfl:  .  cetirizine (ZYRTEC) 10 MG tablet, Take 10 mg by mouth daily., Disp: , Rfl:  .  Colchicine 0.6 MG CAPS, Take 0.6 mg by mouth daily as needed. (for gout), Disp: , Rfl: 0 .  cyclobenzaprine (FLEXERIL) 10 MG tablet, Take 10 mg by mouth at bedtime as needed for muscle spasms., Disp: , Rfl: 0 .  hydroxychloroquine (PLAQUENIL) 200 MG tablet, TAKE 1 TABLET BY MOUTH TWICE A DAY, Disp: 30 tablet, Rfl: 0 .  Insulin Detemir (LEVEMIR FLEXTOUCH) 100 UNIT/ML Pen, Inject 10 units subcutaneous daily at bedtime, Disp: 15 mL, Rfl: 1 .  metFORMIN (GLUCOPHAGE) 500 MG tablet, Take 1 tablet (500 mg total) by mouth 2 (two) times daily with a meal., Disp: 180 tablet, Rfl: 1 .  olmesartan-hydrochlorothiazide (BENICAR HCT) 40-25 MG tablet, Take 1 tablet by mouth daily., Disp: 90 tablet, Rfl: 1 .  OZEMPIC, 0.25 OR 0.5 MG/DOSE, 2 MG/1.5ML SOPN, Inject 0.5 mg into the skin once a week., Disp: 3 pen, Rfl: 1 .  rosuvastatin (CRESTOR) 20 MG tablet,  Take 1 tablet (20 mg total) by mouth every Monday, Wednesday, and Friday., Disp: 12 tablet, Rfl: 6 .  traMADol (ULTRAM) 50 MG tablet, Take 1 tablet (50 mg total) by mouth every 6 (six) hours as needed., Disp: 20 tablet, Rfl: 0 .  Vitamin D, Ergocalciferol, (DRISDOL) 50000 units CAPS capsule, Take 1 capsule by mouth 2 (two) times a week. Tuesdays and Fridays, Disp: , Rfl: 2   Allergies  Allergen Reactions  . Amoxicillin Hives  . Codeine Hives      The patient states she uses post menopausal status for birth control. Last LMP was No LMP  recorded. Patient has had a hysterectomy.. Negative for Dysmenorrhea  Negative for: breast discharge, breast lump(s), breast pain and breast self exam. Associated symptoms include abnormal vaginal bleeding. Pertinent negatives include abnormal bleeding (hematology), anxiety, decreased libido, depression, difficulty falling sleep, dyspareunia, history of infertility, nocturia, sexual dysfunction, sleep disturbances, urinary incontinence, urinary urgency, vaginal discharge and vaginal itching. Diet regular.The patient states her exercise level is  minimal.   . The patient's tobacco use is:  Social History   Tobacco Use  Smoking Status Never Smoker  Smokeless Tobacco Never Used  . She has been exposed to passive smoke. The patient's alcohol use is:  Social History   Substance and Sexual Activity  Alcohol Use Never  . Alcohol/week: 0.0 standard drinks  . Frequency: Never    Review of Systems  Constitutional: Negative.   HENT: Negative.   Eyes: Negative.  Negative for blurred vision.  Respiratory: Negative.  Negative for shortness of breath.   Cardiovascular: Negative.  Negative for chest pain and palpitations.  Endocrine: Negative.   Genitourinary: Negative.   Musculoskeletal: Negative.   Skin: Negative.   Allergic/Immunologic: Negative.   Neurological: Negative.   Hematological: Negative.   Psychiatric/Behavioral: Negative.      Today's Vitals   12/26/18 0951  BP: 116/78  Pulse: 85  Temp: 98.2 F (36.8 C)  TempSrc: Oral  Weight: 228 lb 9.6 oz (103.7 kg)  Height: 5' 5" (1.651 m)   Body mass index is 38.04 kg/m.   Objective:  Physical Exam Vitals signs and nursing note reviewed.  Constitutional:      Appearance: Normal appearance. She is obese.  HENT:     Head: Normocephalic and atraumatic.     Right Ear: Tympanic membrane, ear canal and external ear normal.     Left Ear: Tympanic membrane, ear canal and external ear normal.     Nose: Nose normal.     Mouth/Throat:      Mouth: Mucous membranes are moist.     Pharynx: Oropharynx is clear.  Eyes:     Extraocular Movements: Extraocular movements intact.     Conjunctiva/sclera: Conjunctivae normal.     Pupils: Pupils are equal, round, and reactive to light.  Neck:     Musculoskeletal: Normal range of motion and neck supple.  Cardiovascular:     Rate and Rhythm: Normal rate and regular rhythm.     Pulses: Normal pulses.          Dorsalis pedis pulses are 2+ on the right side and 2+ on the left side.     Heart sounds: Normal heart sounds.  Pulmonary:     Effort: Pulmonary effort is normal.     Breath sounds: Normal breath sounds.  Chest:     Breasts: Tanner Score is 5.        Right: Normal.        Left: Normal.  Abdominal:     General: Abdomen is flat. Bowel sounds are normal.     Palpations: Abdomen is soft.  Genitourinary:    Comments: deferred Musculoskeletal: Normal range of motion.  Feet:     Right foot:     Protective Sensation: 5 sites tested. 5 sites sensed.     Skin integrity: Skin integrity normal.     Toenail Condition: Right toenails are normal.     Left foot:     Protective Sensation: 5 sites tested. 5 sites sensed.     Skin integrity: Skin integrity normal.     Toenail Condition: Left toenails are normal.  Skin:    General: Skin is warm and dry.  Neurological:     General: No focal deficit present.     Mental Status: She is alert and oriented to person, place, and time.  Psychiatric:        Mood and Affect: Mood normal.        Behavior: Behavior normal.         Assessment And Plan:     1. Routine general medical examination at health care facility  A full exam was performed. Importance of monthly self breast exams was discussed with the patient. PATIENT HAS BEEN ADVISED TO GET 30-45 MINUTES REGULAR EXERCISE NO LESS THAN FOUR TO FIVE DAYS PER WEEK - BOTH WEIGHTBEARING EXERCISES AND AEROBIC ARE RECOMMENDED.  SHE WAS ADVISED TO FOLLOW A HEALTHY DIET WITH AT LEAST SIX  FRUITS/VEGGIES PER DAY, DECREASE INTAKE OF RED MEAT, AND TO INCREASE FISH INTAKE TO TWO DAYS PER WEEK.  MEATS/FISH SHOULD NOT BE FRIED, BAKED OR BROILED IS PREFERABLE.  I SUGGEST WEARING SPF 50 SUNSCREEN ON EXPOSED PARTS AND ESPECIALLY WHEN IN THE DIRECT SUNLIGHT FOR AN EXTENDED PERIOD OF TIME.  PLEASE AVOID FAST FOOD RESTAURANTS AND INCREASE YOUR WATER INTAKE.   2. Type 2 diabetes mellitus with stage 2 chronic kidney disease, without long-term current use of insulin ([place])  Diabetic foot exam was performed. I DISCUSSED WITH THE PATIENT AT LENGTH REGARDING THE GOALS OF GLYCEMIC CONTROL AND POSSIBLE LONG-TERM COMPLICATIONS.  I  ALSO STRESSED THE IMPORTANCE OF COMPLIANCE WITH HOME GLUCOSE MONITORING, DIETARY RESTRICTIONS INCLUDING AVOIDANCE OF SUGARY DRINKS/PROCESSED FOODS,  ALONG WITH REGULAR EXERCISE.  I  ALSO STRESSED THE IMPORTANCE OF ANNUAL EYE EXAMS, SELF FOOT CARE AND COMPLIANCE WITH OFFICE VISITS.   - POCT Urinalysis Dipstick (81002) - POCT UA - Microalbumin - BMP8+EGFR; Future - Hemoglobin A1c; Future  3. Hypertensive nephropathy  Chronic, well controlled. She will continue with current meds. She is encouraged to avoid adding salt to her foods. She will rto in six months for re-evaluation. EKG performed, no new findings noted.   - EKG 12-Lead  4. Class 2 severe obesity due to excess calories with serious comorbidity and body mass index (BMI) of 38.0 to 38.9 in adult [facility])  Importance of achieving optimal weight to decrease risk of cardiovascular disease and cancers was discussed with the patient in full detail. She is encouraged to start slowly - start with 10 minutes twice daily at least three to four days per week and to gradually build to 30 minutes five days weekly. She was given tips to incorporate more activity into her daily routine - take stairs when possible, park farther away from her job, grocery stores, etc.      Maximino Greenland, MD    THE PATIENT IS ENCOURAGED TO  PRACTICE SOCIAL DISTANCING DUE TO THE COVID-19 PANDEMIC.

## 2019-01-02 LAB — HM MAMMOGRAPHY

## 2019-01-03 ENCOUNTER — Ambulatory Visit: Payer: BC Managed Care – PPO | Admitting: Rheumatology

## 2019-01-08 ENCOUNTER — Encounter: Payer: Self-pay | Admitting: Internal Medicine

## 2019-01-23 ENCOUNTER — Encounter: Payer: Self-pay | Admitting: Internal Medicine

## 2019-01-23 ENCOUNTER — Other Ambulatory Visit: Payer: BC Managed Care – PPO

## 2019-01-23 ENCOUNTER — Other Ambulatory Visit: Payer: Self-pay

## 2019-01-23 DIAGNOSIS — E1122 Type 2 diabetes mellitus with diabetic chronic kidney disease: Secondary | ICD-10-CM

## 2019-01-24 LAB — BMP8+EGFR
BUN/Creatinine Ratio: 15 (ref 12–28)
BUN: 14 mg/dL (ref 8–27)
CO2: 24 mmol/L (ref 20–29)
Calcium: 9.8 mg/dL (ref 8.7–10.3)
Chloride: 103 mmol/L (ref 96–106)
Creatinine, Ser: 0.92 mg/dL (ref 0.57–1.00)
GFR calc Af Amer: 78 mL/min/{1.73_m2} (ref >59)
GFR calc non Af Amer: 67 mL/min/{1.73_m2} (ref >59)
Glucose: 167 mg/dL — ABNORMAL HIGH (ref 65–99)
Potassium: 4 mmol/L (ref 3.5–5.2)
Sodium: 141 mmol/L (ref 134–144)

## 2019-01-24 LAB — HEMOGLOBIN A1C
Est. average glucose Bld gHb Est-mCnc: 163 mg/dL
Hgb A1c MFr Bld: 7.3 % — ABNORMAL HIGH (ref 4.8–5.6)

## 2019-03-15 DIAGNOSIS — M5030 Other cervical disc degeneration, unspecified cervical region: Secondary | ICD-10-CM | POA: Insufficient documentation

## 2019-04-14 ENCOUNTER — Ambulatory Visit: Payer: BC Managed Care – PPO | Attending: Internal Medicine

## 2019-04-14 DIAGNOSIS — Z23 Encounter for immunization: Secondary | ICD-10-CM

## 2019-04-14 NOTE — Progress Notes (Signed)
   Covid-19 Vaccination Clinic  Name:  Ashley Monroe    MRN: [id number] DOB: 01-21-1956  04/14/2019  [name] was observed post Covid-19 immunization for 15 minutes without incident. She was provided with Vaccine Information Sheet and instruction to access the V-Safe system.   [name] was instructed to call 911 with any severe reactions post vaccine: Marland Kitchen Difficulty breathing  . Swelling of face and throat  . A fast heartbeat  . A bad rash all over body  . Dizziness and weakness   Immunizations Administered    Name Date Dose VIS Date Route   Pfizer COVID-19 Vaccine 04/14/2019  9:45 AM 0.3 mL 01/19/2019 Intramuscular   Manufacturer: [employer], [employer]   Lot: [id number]   NDC: [address]-6

## 2019-04-19 ENCOUNTER — Other Ambulatory Visit: Payer: Self-pay

## 2019-04-25 ENCOUNTER — Encounter: Payer: Self-pay | Admitting: Internal Medicine

## 2019-04-25 ENCOUNTER — Other Ambulatory Visit: Payer: Self-pay | Admitting: Rheumatology

## 2019-04-25 ENCOUNTER — Other Ambulatory Visit: Payer: Self-pay

## 2019-04-25 ENCOUNTER — Ambulatory Visit (INDEPENDENT_AMBULATORY_CARE_PROVIDER_SITE_OTHER): Payer: BC Managed Care – PPO | Admitting: Internal Medicine

## 2019-04-25 ENCOUNTER — Other Ambulatory Visit: Payer: Self-pay | Admitting: Internal Medicine

## 2019-04-25 VITALS — BP 110/74 | HR 84 | Temp 98.0°F | Ht 65.0 in | Wt 224.8 lb

## 2019-04-25 DIAGNOSIS — N182 Chronic kidney disease, stage 2 (mild): Secondary | ICD-10-CM | POA: Diagnosis not present

## 2019-04-25 DIAGNOSIS — Z6837 Body mass index (BMI) 37.0-37.9, adult: Secondary | ICD-10-CM

## 2019-04-25 DIAGNOSIS — I129 Hypertensive chronic kidney disease with stage 1 through stage 4 chronic kidney disease, or unspecified chronic kidney disease: Secondary | ICD-10-CM

## 2019-04-25 DIAGNOSIS — M0579 Rheumatoid arthritis with rheumatoid factor of multiple sites without organ or systems involvement: Secondary | ICD-10-CM | POA: Diagnosis not present

## 2019-04-25 DIAGNOSIS — E1122 Type 2 diabetes mellitus with diabetic chronic kidney disease: Secondary | ICD-10-CM | POA: Diagnosis not present

## 2019-04-25 DIAGNOSIS — E66812 Obesity, class 2: Secondary | ICD-10-CM | POA: Insufficient documentation

## 2019-04-25 NOTE — Progress Notes (Signed)
This visit occurred during the SARS-CoV-2 public health emergency.  Safety protocols were in place, including screening questions prior to the visit, additional usage of staff PPE, and extensive cleaning of exam room while observing appropriate contact time as indicated for disinfecting solutions.  Subjective:     Patient ID: Ashley Monroe , female    DOB: Apr 29, 1955 , 64 y.o.   MRN: [id number]   Chief Complaint  Patient presents with  . Diabetes  . Hypertension    HPI  Diabetes She presents for her follow-up diabetic visit. She has type 2 diabetes mellitus. Her disease course has been stable. There are no hypoglycemic associated symptoms. Pertinent negatives for diabetes include no blurred vision and no chest pain. There are no hypoglycemic complications. Risk factors for coronary artery disease include diabetes mellitus, dyslipidemia, hypertension, obesity and sedentary lifestyle. She is following a generally healthy diet. Her breakfast blood glucose is taken between 7-8 am. Her breakfast blood glucose range is generally 130-140 mg/dl. She does not see a podiatrist.Eye exam is current.  Hypertension This is a chronic problem. The current episode started more than 1 year ago. The problem has been gradually improving since onset. The problem is controlled. Pertinent negatives include no blurred vision, chest pain, palpitations or shortness of breath. The current treatment provides moderate improvement. Compliance problems include exercise.      Past Medical History:  Diagnosis Date  . Allergy   . Diabetes mellitus without complication ([place])   . Hyperlipidemia 08/19/2016  . Hypertension      Family History  Problem Relation Age of Onset  . Atrial fibrillation Mother   . Stroke Mother   . Heart attack Mother   . Aneurysm Mother   . Atrial fibrillation Brother   . Stroke Brother   . Diabetes Brother   . Diabetes Father   . Alzheimer's disease Father   . Diabetes Brother   .  Alzheimer's disease Maternal Grandmother   . Heart attack Maternal Grandfather   . Cancer Paternal Grandmother   . Healthy Son   . Healthy Daughter      Current Outpatient Medications:  .  amLODipine (NORVASC) 10 MG tablet, Take 1 tablet (10 mg total) by mouth daily., Disp: 90 tablet, Rfl: 1 .  aspirin EC 81 MG tablet, Take 81 mg by mouth daily., Disp: , Rfl:  .  cetirizine (ZYRTEC) 10 MG tablet, Take 10 mg by mouth daily., Disp: , Rfl:  .  Colchicine 0.6 MG CAPS, Take 0.6 mg by mouth daily as needed. (for gout), Disp: , Rfl: 0 .  cyclobenzaprine (FLEXERIL) 10 MG tablet, Take 10 mg by mouth at bedtime as needed for muscle spasms., Disp: , Rfl: 0 .  hydroxychloroquine (PLAQUENIL) 200 MG tablet, TAKE 1 TABLET BY MOUTH TWICE A DAY, Disp: 30 tablet, Rfl: 0 .  Insulin Detemir (LEVEMIR FLEXTOUCH) 100 UNIT/ML Pen, Inject 10 units subcutaneous daily at bedtime, Disp: 15 mL, Rfl: 1 .  metFORMIN (GLUCOPHAGE) 500 MG tablet, Take 1 tablet (500 mg total) by mouth 2 (two) times daily with a meal., Disp: 180 tablet, Rfl: 1 .  olmesartan-hydrochlorothiazide (BENICAR HCT) 40-25 MG tablet, Take 1 tablet by mouth daily., Disp: 90 tablet, Rfl: 1 .  OZEMPIC, 0.25 OR 0.5 MG/DOSE, 2 MG/1.5ML SOPN, Inject 0.5 mg into the skin once a week., Disp: 3 pen, Rfl: 1 .  rosuvastatin (CRESTOR) 20 MG tablet, Take 1 tablet (20 mg total) by mouth every Monday, Wednesday, and Friday., Disp: 12 tablet, Rfl: 6  Allergies  Allergen Reactions  . Amoxicillin Hives  . Codeine Hives     Review of Systems  Constitutional: Negative.   Eyes: Negative for blurred vision.  Respiratory: Negative.  Negative for shortness of breath.   Cardiovascular: Negative.  Negative for chest pain and palpitations.  Gastrointestinal: Negative.   Neurological: Negative.   Psychiatric/Behavioral: Negative.      Today's Vitals   04/25/19 1117  BP: 110/74  Pulse: 84  Temp: 98 F (36.7 C)  TempSrc: Oral  Weight: 224 lb 12.8 oz (102 kg)   Height: '5\' 5"'$  (1.651 m)   Body mass index is 37.41 kg/m.   Objective:  Physical Exam Vitals and nursing note reviewed.  Constitutional:      Appearance: Normal appearance. She is obese.  HENT:     Head: Normocephalic and atraumatic.  Cardiovascular:     Rate and Rhythm: Normal rate and regular rhythm.     Heart sounds: Normal heart sounds.  Pulmonary:     Effort: Pulmonary effort is normal.     Breath sounds: Normal breath sounds.  Skin:    General: Skin is warm.  Neurological:     General: No focal deficit present.     Mental Status: She is alert.  Psychiatric:        Mood and Affect: Mood normal.        Behavior: Behavior normal.         Assessment And Plan:     1. Type 2 diabetes mellitus with stage 2 chronic kidney disease, without long-term current use of insulin (HCC)  Chronic, I will check labs as listed below. She is encouraged to incorporate more exercise into her daily routine. I will adjust meds as needed after reviewing her lab results.   - Lipid panel - Hemoglobin A1c - CMP14+EGFR  2. Hypertensive nephropathy  Chronic, well controlled. She will continue with current meds. She is encouraged to avoid adding salt to her foods.   3. Rheumatoid arthritis involving multiple sites with positive rheumatoid factor (HCC)  Chronic. Does not wish to start leflunomide as per Rheum suggestion. She will discuss further with Rheum at her next appt. She is encouraged to follow an anti-inflammatory diet.   4. Class 2 severe obesity due to excess calories with serious comorbidity and body mass index (BMI) of 37.0 to 37.9 in adult [facility])  She is encouraged to strive for BMI less than 32 to decrease cardiac risk. Advised to exercise no less than 150 minutes per week. Also encouraged to avoid sugary beverages and processed foods.   Maximino Greenland, MD    THE PATIENT IS ENCOURAGED TO PRACTICE SOCIAL DISTANCING DUE TO THE COVID-19 PANDEMIC.

## 2019-04-25 NOTE — Patient Instructions (Signed)

## 2019-04-26 LAB — HEMOGLOBIN A1C
Est. average glucose Bld gHb Est-mCnc: 180 mg/dL
Hgb A1c MFr Bld: 7.9 % — ABNORMAL HIGH (ref 4.8–5.6)

## 2019-04-26 LAB — CMP14+EGFR
ALT: 17 IU/L (ref 0–32)
AST: 15 IU/L (ref 0–40)
Albumin/Globulin Ratio: 1.8 (ref 1.2–2.2)
Albumin: 4.4 g/dL (ref 3.8–4.8)
Alkaline Phosphatase: 129 IU/L — ABNORMAL HIGH (ref 39–117)
BUN/Creatinine Ratio: 16 (ref 12–28)
BUN: 19 mg/dL (ref 8–27)
Bilirubin Total: <0.2 mg/dL (ref 0.0–1.2)
CO2: 22 mmol/L (ref 20–29)
Calcium: 10.2 mg/dL (ref 8.7–10.3)
Chloride: 103 mmol/L (ref 96–106)
Creatinine, Ser: 1.18 mg/dL — ABNORMAL HIGH (ref 0.57–1.00)
GFR calc Af Amer: 57 mL/min/{1.73_m2} — ABNORMAL LOW (ref >59)
GFR calc non Af Amer: 50 mL/min/{1.73_m2} — ABNORMAL LOW (ref >59)
Globulin, Total: 2.4 g/dL (ref 1.5–4.5)
Glucose: 159 mg/dL — ABNORMAL HIGH (ref 65–99)
Potassium: 4.3 mmol/L (ref 3.5–5.2)
Sodium: 142 mmol/L (ref 134–144)
Total Protein: 6.8 g/dL (ref 6.0–8.5)

## 2019-04-26 LAB — LIPID PANEL
Chol/HDL Ratio: 4.6 ratio — ABNORMAL HIGH (ref 0.0–4.4)
Cholesterol, Total: 183 mg/dL (ref 100–199)
HDL: 40 mg/dL (ref >39)
LDL Chol Calc (NIH): 97 mg/dL (ref 0–99)
Triglycerides: 272 mg/dL — ABNORMAL HIGH (ref 0–149)
VLDL Cholesterol Cal: 46 mg/dL — ABNORMAL HIGH (ref 5–40)

## 2019-05-05 ENCOUNTER — Ambulatory Visit: Payer: BC Managed Care – PPO | Attending: Internal Medicine

## 2019-05-05 DIAGNOSIS — Z23 Encounter for immunization: Secondary | ICD-10-CM

## 2019-05-05 NOTE — Progress Notes (Signed)
   Covid-19 Vaccination Clinic  Name:  Ashley Monroe    MRN: [id number] DOB: Oct 18, 1955  05/05/2019  [name] was observed post Covid-19 immunization for 15 minutes without incident. She was provided with Vaccine Information Sheet and instruction to access the V-Safe system.   [name] was instructed to call 911 with any severe reactions post vaccine: Marland Kitchen Difficulty breathing  . Swelling of face and throat  . A fast heartbeat  . A bad rash all over body  . Dizziness and weakness   Immunizations Administered    Name Date Dose VIS Date Route   Pfizer COVID-19 Vaccine 05/05/2019  9:25 AM 0.3 mL 01/19/2019 Intramuscular   Manufacturer: [employer], [employer]   Lot: [id number]   NDC: [address]-8

## 2019-06-01 ENCOUNTER — Encounter: Payer: Self-pay | Admitting: Cardiovascular Disease

## 2019-06-01 ENCOUNTER — Ambulatory Visit: Payer: BC Managed Care – PPO | Admitting: Cardiovascular Disease

## 2019-06-01 ENCOUNTER — Other Ambulatory Visit: Payer: Self-pay

## 2019-06-01 VITALS — BP 122/82 | HR 90 | Ht 65.5 in | Wt 229.2 lb

## 2019-06-01 DIAGNOSIS — E7800 Pure hypercholesterolemia, unspecified: Secondary | ICD-10-CM

## 2019-06-01 DIAGNOSIS — I10 Essential (primary) hypertension: Secondary | ICD-10-CM

## 2019-06-01 HISTORY — DX: Essential (primary) hypertension: I10

## 2019-06-01 MED ORDER — ROSUVASTATIN CALCIUM 20 MG PO TABS: 20.0000 mg | ORAL_TABLET | ORAL | 3 refills | Status: DC

## 2019-06-01 NOTE — Patient Instructions (Signed)
Medication Instructions:  Your physician recommends that you continue on your current medications as directed. Please refer to the Current Medication list given to you today.  Lab Work: NONE   Testing/Procedures: NONE   Follow-Up: AS NEEDED  

## 2019-06-01 NOTE — Progress Notes (Signed)
Cardiology Office Note   Date:  06/01/2019   ID:  Ashley, Monroe December 05, 1955, MRN [id number]  PCP:  Glendale Chard, MD  Cardiologist:   Skeet Latch, MD   No chief complaint on file.   History of Present Illness: Ashley Monroe is a 64 y.o. female with hypertension, diabetes, and CKD II here for follow up. She was seen 08/2016 for the evaluation of chest pain.  Her symptoms were atypical and seemed more consistent with GERD.  However, given her risk factors she was referred for an ETT.  She underwent stress testing on 08/24/16 that was low risk for ischemia.  She achieved 7 METS on a Bruce protocol.  At that appointment her ASCVD 10 year risk was greater than 10%, so she was started on rosuvastatin.  She has concerns about statins because her mother had a lot of side effects.  Since starting that medication she has noted cramping in bilateral legs. She is otherwise been feeling well and denies any chest pain or shortness of breath. She hasn't noted any lower extremity edema, orthopnea, or PND.  At her last appointment she was also started on amlodipine due to poorly-controlled blood pressure.  She had myalgias so rosuvastatin was held for one week.  Her symptoms did not improve.  She has been able to tolerate it 3 times a week but has been out of it for over a month.  She was not taking it when labs were checked with Dr. Baird Cancer last month.    [name] found out that her chest pain and arm pain and weakness were actually due to cervical disc disease.  She underwent decompression surgery 08/2017.  Lately she has noted some intermittent pain in her neck and chest.  This occurs when she is at work.  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  She denies any chest or neck pain with walking up inclines or stairs.  She has not been getting much exercise lately.  She has been caring for her husband who had a stroke.  He has had a lot of esophageal issues and  feeding issues and currently has a PEG.  She has no lower extremity edema, orthopnea, or PND.   Past Medical History:  Diagnosis Date  . Allergy   . Diabetes mellitus without complication ([place])   . Essential hypertension 06/01/2019  . Hyperlipidemia 08/19/2016  . Hypertension     Past Surgical History:  Procedure Laterality Date  . ABDOMINAL HYSTERECTOMY  2003  . CERVICAL SPINE SURGERY  08/2017  . CHOLECYSTECTOMY  1982  . OVARIAN CYST REMOVAL  1981     Current Outpatient Medications  Medication Sig Dispense Refill  . amLODipine (NORVASC) 10 MG tablet Take 1 tablet (10 mg total) by mouth daily. 90 tablet 1  . aspirin EC 81 MG tablet Take 81 mg by mouth daily.    . cetirizine (ZYRTEC) 10 MG tablet Take 10 mg by mouth daily.    . Colchicine 0.6 MG CAPS Take 0.6 mg by mouth daily as needed. (for gout)  0  . cyclobenzaprine (FLEXERIL) 10 MG tablet Take 10 mg by mouth at bedtime as needed for muscle spasms.  0  . gabapentin (NEURONTIN) 300 MG capsule Take 300 mg by mouth 3 (three) times daily.    . hydroxychloroquine (PLAQUENIL) 200 MG tablet TAKE 1 TABLET BY MOUTH TWICE A DAY 30 tablet 0  . Insulin Detemir (LEVEMIR FLEXTOUCH) 100 UNIT/ML  Pen Inject 10 units subcutaneous daily at bedtime 15 mL 1  . metFORMIN (GLUCOPHAGE) 500 MG tablet Take 1 tablet (500 mg total) by mouth 2 (two) times daily with a meal. 180 tablet 1  . olmesartan-hydrochlorothiazide (BENICAR HCT) 40-25 MG tablet TAKE 1 TABLET BY MOUTH EVERY DAY 90 tablet 1  . OZEMPIC, 0.25 OR 0.5 MG/DOSE, 2 MG/1.5ML SOPN Inject 0.5 mg into the skin once a week. 3 pen 1  . rosuvastatin (CRESTOR) 20 MG tablet Take 1 tablet (20 mg total) by mouth every Monday, Wednesday, and Friday. 36 tablet 3   No current facility-administered medications for this visit.    Allergies:   Amoxicillin and Codeine    Social History:  The patient  reports that she has never smoked. She has never used smokeless tobacco. She reports that she does not drink  alcohol or use drugs.   Family History:  The patient's family history includes Alzheimer's disease in her father and maternal grandmother; Aneurysm in her mother; Atrial fibrillation in her brother and mother; Cancer in her paternal grandmother; Diabetes in her brother, brother, and father; Healthy in her daughter and son; Heart attack in her maternal grandfather and mother; Stroke in her brother and mother.    ROS:  Please see the history of present illness.   Otherwise, review of systems are positive for none.   All other systems are reviewed and negative.    PHYSICAL EXAM: VS:  BP 122/82   Pulse 90   Ht 5' 5.5" (1.664 m)   Wt 229 lb 3.2 oz (104 kg)   SpO2 96%   BMI 37.56 kg/m  , BMI Body mass index is 37.56 kg/m. GENERAL:  Well appearing HEENT: Pupils equal round and reactive, fundi not visualized, oral mucosa unremarkable NECK:  No jugular venous distention, waveform within normal limits, carotid upstroke brisk and symmetric, no bruits LUNGS:  Clear to auscultation bilaterally HEART:  RRR.  PMI not displaced or sustained,S1 and S2 within normal limits, no S3, no S4, no clicks, no rubs, no murmurs ABD:  Flat, positive bowel sounds normal in frequency in pitch, no bruits, no rebound, no guarding, no midline pulsatile mass, no hepatomegaly, no splenomegaly EXT:  2 plus pulses throughout, no edema, no cyanosis no clubbing SKIN:  No rashes no nodules NEURO:  Cranial nerves II through XII grossly intact, motor grossly intact throughout PSYCH:  Cognitively intact, oriented to person place and time   EKG:  EKG is ordered today. The ekg ordered 08/17/16 demonstrates Sinus rhythm. 87 bpm. 06/06/17: Sinus rhythm.  Rate 100 bpm. 06/01/2019: Sinus rhythm.  Rate 90 bpm.  Nonspecific T wave generalities.  ETT 08/24/16: Upsloping ST segment depression ST segment depression of 0.5 mm was noted during stress in the III, aVF, V4, V5 and V6 leads, and returning to baseline after 1-5 minutes of  recovery.  Exercise time 5:10. Fatigue. Non diagnostic ST changes  Duke treadmill score is 5. Overall low risk.  Recent Labs: 09/12/2018: Hemoglobin 13.9; Platelets 315 04/25/2019: ALT 17; BUN 19; Creatinine, Ser 1.18; Potassium 4.3; Sodium 142   09/21/16: Sodium 142, potassium 4.5, BUN 16, creatinine 0.8 AST 19, ALT 21 WBC 5.2, hemoglobin 12.9, hematocrit 40.6, platelets 281 Total cholesterol 105, triglycerides 137, HDL 35, LDL 43 Hemoglobin L4Y 8.2% TSH 1.3, free T4 1.  06/30/16: Sodium 140, potassium 4.1, BUN 18, creatinine 0.85 AST 17, ALT 19 Total cholesterol 196, triglycerides 277, HDL 38, LDL 103  Lipid Panel    Component Value Date/Time  CHOL 183 04/25/2019 1646   TRIG 272 (H) 04/25/2019 1646   HDL 40 04/25/2019 1646   CHOLHDL 4.6 (H) 04/25/2019 1646   LDLCALC 97 04/25/2019 1646      Wt Readings from Last 3 Encounters:  06/01/19 229 lb 3.2 oz (104 kg)  04/25/19 224 lb 12.8 oz (102 kg)  12/26/18 228 lb 9.6 oz (103.7 kg)      ASSESSMENT AND PLAN:  # Atypcial chest pain: Symptoms were due to cervical disc disease.  ETT was negative for ischemia.  She only has it with repetitive movements and not with exertion.  No repeat ischemia evaluation at this time.  # Hyperlipidemia: Continue rosuvastatin.  LDL was 97 when it was last checked.  However she was not taking her rosuvastatin at the time.  She is going to resume it.  Dr. Allyne Gee checks this for her regularly.  She will need to continue to monitor her triglycerides as well.  We discussed increasing exercise and limiting carbohydrate intake.  If it remains elevated would consider adding at least fish oil.  # Hypertension: Blood pressure is controlled.  Continue amlodipine and hold losartan/HCTZ.   Current medicines are reviewed at length with the patient today.  The patient does not have concerns regarding medicines.  The following changes have been made:  none  Labs/ tests ordered today include:   Orders  Placed This Encounter  Procedures  . EKG 12-Lead     Disposition:   FU with Sheralyn Pinegar C. Duke Salvia, MD, [facility] as needed   This note was written with the assistance of speech recognition software.  Please excuse any transcriptional errors.  Signed, Salwa Bai C. Duke Salvia, MD, [facility]  06/01/2019 4:57 PM    [facility]

## 2019-06-14 ENCOUNTER — Other Ambulatory Visit: Payer: Self-pay | Admitting: Neurological Surgery

## 2019-06-14 DIAGNOSIS — G9589 Other specified diseases of spinal cord: Secondary | ICD-10-CM | POA: Insufficient documentation

## 2019-06-14 DIAGNOSIS — M5412 Radiculopathy, cervical region: Secondary | ICD-10-CM

## 2019-06-15 ENCOUNTER — Telehealth: Payer: Self-pay

## 2019-06-15 NOTE — Telephone Encounter (Signed)
Patient returned my call for me to review her allergies and medications and to explain procedure to her. She was informed she will be here two hours, will need a driver and will need to be on strict bedrest for 24 hours after the procedure.  She has no medications to hold.

## 2019-06-26 ENCOUNTER — Ambulatory Visit
Admission: RE | Admit: 2019-06-26 | Discharge: 2019-06-26 | Disposition: A | Discharge status: Home / Self Care | Payer: BC Managed Care – PPO | Source: Ambulatory Visit | Attending: Neurological Surgery | Admitting: Neurological Surgery

## 2019-06-26 VITALS — BP 138/83 | HR 70

## 2019-06-26 DIAGNOSIS — G9589 Other specified diseases of spinal cord: Secondary | ICD-10-CM

## 2019-06-26 DIAGNOSIS — M5412 Radiculopathy, cervical region: Secondary | ICD-10-CM

## 2019-06-26 DIAGNOSIS — M4802 Spinal stenosis, cervical region: Secondary | ICD-10-CM

## 2019-06-26 DIAGNOSIS — M961 Postlaminectomy syndrome, not elsewhere classified: Secondary | ICD-10-CM

## 2019-06-26 DIAGNOSIS — G992 Myelopathy in diseases classified elsewhere: Secondary | ICD-10-CM

## 2019-06-26 DIAGNOSIS — M5030 Other cervical disc degeneration, unspecified cervical region: Secondary | ICD-10-CM

## 2019-06-26 MED ORDER — ONDANSETRON HCL 4 MG/2ML IJ SOLN
4.0000 mg | Freq: Once | INTRAMUSCULAR | Status: AC
  Administered 2019-06-26: 4 mg via INTRAMUSCULAR

## 2019-06-26 MED ORDER — DIAZEPAM 5 MG PO TABS
10.0000 mg | ORAL_TABLET | Freq: Once | ORAL | Status: AC
  Administered 2019-06-26: 5 mg via ORAL

## 2019-06-26 MED ORDER — IOPAMIDOL (ISOVUE-M 300) INJECTION 61%
10.0000 mL | Freq: Once | INTRAMUSCULAR | Status: AC | PRN
  Administered 2019-06-26: 10 mL via INTRATHECAL

## 2019-06-26 MED ORDER — MEPERIDINE HCL 50 MG/ML IJ SOLN
50.0000 mg | Freq: Once | INTRAMUSCULAR | Status: AC
  Administered 2019-06-26: 50 mg via INTRAMUSCULAR

## 2019-06-26 NOTE — Discharge Instructions (Signed)

## 2019-06-28 ENCOUNTER — Other Ambulatory Visit: Payer: Self-pay | Admitting: Neurological Surgery

## 2019-06-29 ENCOUNTER — Telehealth: Payer: Self-pay

## 2019-06-29 NOTE — Telephone Encounter (Signed)
   [facility] Pre-operative Risk Assessment     Request for surgical clearance:  1. What type of surgery is being performed? C4-5, C5-6 Anterior Cervical Fusion    2. When is this surgery scheduled? 08/03/19   3. What type of clearance is required (medical clearance vs. Pharmacy clearance to hold med vs. Both)? Both   4. Are there any medications that need to be held prior to surgery and how long? Aspirin    5. Practice name and name of physician performing surgery? [facility] Dr. Sherley Bounds   6. What is the office phone number? [phone]   7.   What is the office fax number? [phone] Attn: Lorriane Shire  8.   Anesthesia type (None, local, MAC, general) ?  General

## 2019-07-02 NOTE — Telephone Encounter (Signed)
OK to hold aspirin.  

## 2019-07-05 NOTE — Telephone Encounter (Signed)
   Primary Cardiologist: Chilton Si, MD  Chart reviewed as part of pre-operative protocol coverage. Given past medical history and time since last visit, based on ACC/AHA guidelines, Ashley Monroe would be at acceptable risk for the planned procedure without further cardiovascular testing.   She may hold her aspirin for 7 days prior to the procedure and resume as soon as hemostasis is achieved.  I will route this recommendation to the requesting party via Epic fax function and remove from pre-op pool.  Please call with questions.  [name]. Kyesha Balla NP-C    07/05/2019, 11:50 AM [facility] [address] Office [phone] Fax [phone]

## 2019-07-25 NOTE — Pre-Procedure Instructions (Signed)
ELAIZA SHOBERG  07/25/2019      [id number] - [place], [place] [address] [place] [address] Phone: [phone] Fax: [phone]    Your procedure is scheduled on June 21  Report to [facility] entrance A at  5:30 A.M.  Call this number if you have problems the morning of surgery:  [phone]   Remember:  Do not eat or drink after midnight.      Take these medicines the morning of surgery with A SIP OF WATER :              amoldipine (norvasc)             Cetirizine (zyrtec)             Gabapentin (neurontin)             Hydroxychloroquine (plaquenil)             Rosuvastatin (crestor)                 7 days prior to surgery STOP taking any Aspirin (unless otherwise instructed by your surgeon), Aleve, Naproxen, Ibuprofen, Motrin, Advil, Goody's, BC's, all herbal medications, fish oil, and all vitamins.              Follow your surgeon's instructions on when to stop Aspirin.  If no instructions were given by your surgeon then you will need to call the office to get those instructions.                    How to Manage Your Diabetes Before and After Surgery  Why is it important to control my blood sugar before and after surgery?  Improving blood sugar levels before and after surgery helps healing and can limit problems.  A way of improving blood sugar control is eating a healthy diet by: o  Eating less sugar and carbohydrates o  Increasing activity/exercise o  Talking with your doctor about reaching your blood sugar goals  High blood sugars (greater than 180 mg/dL) can raise your risk of infections and slow your recovery, so you will need to focus on controlling your diabetes during the weeks before surgery.  Make sure that the doctor who takes care of your diabetes knows about your planned surgery including the date and location.  How do I manage my blood sugar before  surgery?  Check your blood sugar at least 4 times a day, starting 2 days before surgery, to make sure that the level is not too high or low. o Check your blood sugar the morning of your surgery when you wake up and every 2 hours until you get to the Short Stay unit.  If your blood sugar is less than 70 mg/dL, you will need to treat for low blood sugar: o Do not take insulin. o Treat a low blood sugar (less than 70 mg/dL) with  cup of clear juice (cranberry or apple), 4 glucose tablets, OR glucose gel. Recheck blood sugar in 15 minutes after treatment (to make sure it is greater than 70 mg/dL). If your blood sugar is not greater than 70 mg/dL on recheck, call [phone] o  for further instructions.  Report your blood sugar to the short stay nurse when you get to Short Stay.   If you are admitted to the hospital after surgery: o Your blood sugar will be checked by the staff and  you will probably be given insulin after surgery (instead of oral diabetes medicines) to make sure you have good blood sugar levels. o The goal for blood sugar control after surgery is 80-180 mg/dL.       WHAT DO I DO ABOUT MY DIABETES MEDICATION?    Do not take oral diabetes medicines (pills) the morning of surgery. (metformin/glucophage)     THE NIGHT BEFORE SURGERY, take ________5___ units of _____levemir______insulin.         The day of surgery, do not take other diabetes injectables, including Byetta (exenatide), Bydureon (exenatide ER), Victoza (liraglutide), or Trulicity (dulaglutide).     Do not wear jewelry, make-up or nail polish.  Do not wear lotions, powders, or perfumes, or deodorant.  Do not shave 48 hours prior to surgery.  Men may shave face and neck.  Do not bring valuables to the hospital.  [facility] is not responsible for any belongings or valuables.  Contacts, dentures or bridgework may not be worn into surgery.  Leave your suitcase in the car.  After surgery it may be  brought to your room.  For patients admitted to the hospital, discharge time will be determined by your treatment team.  Patients discharged the day of surgery will not be allowed to drive home.    Special instructions:   [place]- Preparing For Surgery  Before surgery, you can play an important role. Because skin is not sterile, your skin needs to be as free of germs as possible. You can reduce the number of germs on your skin by washing with CHG (chlorahexidine gluconate) Soap before surgery.  CHG is an antiseptic cleaner which kills germs and bonds with the skin to continue killing germs even after washing.    Oral Hygiene is also important to reduce your risk of infection.  Remember - BRUSH YOUR TEETH THE MORNING OF SURGERY WITH YOUR REGULAR TOOTHPASTE  Please do not use if you have an allergy to CHG or antibacterial soaps. If your skin becomes reddened/irritated stop using the CHG.  Do not shave (including legs and underarms) for at least 48 hours prior to first CHG shower. It is OK to shave your face.  Please follow these instructions carefully.   1. Shower the NIGHT BEFORE SURGERY and the MORNING OF SURGERY with CHG.   2. If you chose to wash your hair, wash your hair first as usual with your normal shampoo.  3. After you shampoo, rinse your hair and body thoroughly to remove the shampoo.  4. Use CHG as you would any other liquid soap. You can apply CHG directly to the skin and wash gently with a scrungie or a clean washcloth.   5. Apply the CHG Soap to your body ONLY FROM THE NECK DOWN.  Do not use on open wounds or open sores. Avoid contact with your eyes, ears, mouth and genitals (private parts). Wash Face and genitals (private parts)  with your normal soap.  6. Wash thoroughly, paying special attention to the area where your surgery will be performed.  7. Thoroughly rinse your body with warm water from the neck down.  8. DO NOT shower/wash with your normal soap after  using and rinsing off the CHG Soap.  9. Pat yourself dry with a CLEAN TOWEL.  10. Wear CLEAN PAJAMAS to bed the night before surgery, wear comfortable clothes the morning of surgery  11. Place CLEAN SHEETS on your bed the night of your first shower and DO NOT SLEEP WITH  PETS.    Day of Surgery:  Do not apply any deodorants/lotions.  Please wear clean clothes to the hospital/surgery center.   Remember to brush your teeth WITH YOUR REGULAR TOOTHPASTE.    Please read over the following fact sheets that you were given. Coughing and Deep Breathing and Surgical Site Infection Prevention

## 2019-07-26 ENCOUNTER — Encounter (HOSPITAL_COMMUNITY)
Admission: RE | Admit: 2019-07-26 | Discharge: 2019-07-26 | Disposition: A | Discharge status: Home / Self Care | Payer: BC Managed Care – PPO | Source: Ambulatory Visit | Attending: Neurological Surgery | Admitting: Neurological Surgery

## 2019-07-26 ENCOUNTER — Other Ambulatory Visit (HOSPITAL_COMMUNITY)
Admission: RE | Admit: 2019-07-26 | Discharge: 2019-07-26 | Disposition: A | Discharge status: Home / Self Care | Payer: BC Managed Care – PPO | Source: Ambulatory Visit | Attending: Neurological Surgery | Admitting: Neurological Surgery

## 2019-07-26 ENCOUNTER — Other Ambulatory Visit: Payer: Self-pay

## 2019-07-26 ENCOUNTER — Encounter (HOSPITAL_COMMUNITY): Payer: Self-pay

## 2019-07-26 DIAGNOSIS — Z794 Long term (current) use of insulin: Secondary | ICD-10-CM | POA: Diagnosis not present

## 2019-07-26 DIAGNOSIS — N189 Chronic kidney disease, unspecified: Secondary | ICD-10-CM | POA: Insufficient documentation

## 2019-07-26 DIAGNOSIS — Z6837 Body mass index (BMI) 37.0-37.9, adult: Secondary | ICD-10-CM | POA: Diagnosis not present

## 2019-07-26 DIAGNOSIS — Z01812 Encounter for preprocedural laboratory examination: Secondary | ICD-10-CM | POA: Diagnosis present

## 2019-07-26 DIAGNOSIS — E669 Obesity, unspecified: Secondary | ICD-10-CM | POA: Insufficient documentation

## 2019-07-26 DIAGNOSIS — Z7982 Long term (current) use of aspirin: Secondary | ICD-10-CM | POA: Diagnosis not present

## 2019-07-26 DIAGNOSIS — E1122 Type 2 diabetes mellitus with diabetic chronic kidney disease: Secondary | ICD-10-CM | POA: Diagnosis not present

## 2019-07-26 DIAGNOSIS — E785 Hyperlipidemia, unspecified: Secondary | ICD-10-CM | POA: Insufficient documentation

## 2019-07-26 DIAGNOSIS — Z20822 Contact with and (suspected) exposure to covid-19: Secondary | ICD-10-CM | POA: Diagnosis not present

## 2019-07-26 DIAGNOSIS — M4802 Spinal stenosis, cervical region: Secondary | ICD-10-CM | POA: Diagnosis not present

## 2019-07-26 DIAGNOSIS — I129 Hypertensive chronic kidney disease with stage 1 through stage 4 chronic kidney disease, or unspecified chronic kidney disease: Secondary | ICD-10-CM | POA: Diagnosis not present

## 2019-07-26 DIAGNOSIS — Z79899 Other long term (current) drug therapy: Secondary | ICD-10-CM | POA: Diagnosis not present

## 2019-07-26 DIAGNOSIS — Z7901 Long term (current) use of anticoagulants: Secondary | ICD-10-CM | POA: Diagnosis not present

## 2019-07-26 HISTORY — DX: Carpal tunnel syndrome, unspecified upper limb: G56.00

## 2019-07-26 HISTORY — DX: Other specified postprocedural states: R11.2

## 2019-07-26 HISTORY — DX: Unspecified osteoarthritis, unspecified site: M19.90

## 2019-07-26 HISTORY — DX: Nausea with vomiting, unspecified: Z98.890

## 2019-07-26 HISTORY — DX: Chronic kidney disease, unspecified: N18.9

## 2019-07-26 LAB — BASIC METABOLIC PANEL
Anion gap: 8 (ref 5–15)
BUN: 13 mg/dL (ref 8–23)
CO2: 27 mmol/L (ref 22–32)
Calcium: 9.3 mg/dL (ref 8.9–10.3)
Chloride: 102 mmol/L (ref 98–111)
Creatinine, Ser: 0.86 mg/dL (ref 0.44–1.00)
GFR calc Af Amer: >60 mL/min (ref >60)
GFR calc non Af Amer: >60 mL/min (ref >60)
Glucose, Bld: 125 mg/dL — ABNORMAL HIGH (ref 70–99)
Potassium: 3.6 mmol/L (ref 3.5–5.1)
Sodium: 137 mmol/L (ref 135–145)

## 2019-07-26 LAB — CBC WITH DIFFERENTIAL/PLATELET
Abs Immature Granulocytes: 0.02 10*3/uL (ref 0.00–0.07)
Basophils Absolute: 0.1 10*3/uL (ref 0.0–0.1)
Basophils Relative: 1 %
Eosinophils Absolute: 0.2 10*3/uL (ref 0.0–0.5)
Eosinophils Relative: 3 %
HCT: 42.7 % (ref 36.0–46.0)
Hemoglobin: 13.4 g/dL (ref 12.0–15.0)
Immature Granulocytes: 0 %
Lymphocytes Relative: 34 %
Lymphs Abs: 2.1 10*3/uL (ref 0.7–4.0)
MCH: 27.3 pg (ref 26.0–34.0)
MCHC: 31.4 g/dL (ref 30.0–36.0)
MCV: 87.1 fL (ref 80.0–100.0)
Monocytes Absolute: 0.5 10*3/uL (ref 0.1–1.0)
Monocytes Relative: 8 %
Neutro Abs: 3.4 10*3/uL (ref 1.7–7.7)
Neutrophils Relative %: 54 %
Platelets: 330 10*3/uL (ref 150–400)
RBC: 4.9 MIL/uL (ref 3.87–5.11)
RDW: 13.4 % (ref 11.5–15.5)
WBC: 6.3 10*3/uL (ref 4.0–10.5)
nRBC: 0 % (ref 0.0–0.2)

## 2019-07-26 LAB — SURGICAL PCR SCREEN
MRSA, PCR: NEGATIVE
Staphylococcus aureus: NEGATIVE

## 2019-07-26 LAB — GLUCOSE, CAPILLARY: Glucose-Capillary: 118 mg/dL — ABNORMAL HIGH (ref 70–99)

## 2019-07-26 LAB — PROTIME-INR
INR: 1 (ref 0.8–1.2)
Prothrombin Time: 13 seconds (ref 11.4–15.2)

## 2019-07-26 LAB — SARS CORONAVIRUS 2 (TAT 6-24 HRS): SARS Coronavirus 2: NEGATIVE

## 2019-07-26 MED ORDER — CHLORHEXIDINE GLUCONATE CLOTH 2 % EX PADS: 6.0000 | MEDICATED_PAD | Freq: Once | CUTANEOUS | Status: DC

## 2019-07-26 NOTE — Progress Notes (Signed)
PCP - r. SANDERS Cardiologist - dr Duke Salvia     clearance    Chest x-ray - 07/26/19 EKG - 2/21 Stress Test - 7/18 ECHO -na  Cardiac Cath - na   Fasting Blood Sugar - 161 Checks Blood Sugar _2____ times a day  Aspirin Instructions:stop    COVID TEST- 07/26/19   Anesthesia review: htn    Patient denies shortness of breath, fever, cough and chest pain at PAT appointment   All instructions explained to the patient, with a verbal understanding of the material. Patient agrees to go over the instructions while at home for a better understanding. Patient also instructed to self quarantine after being tested for COVID-19. The opportunity to ask questions was provided.

## 2019-07-27 LAB — HEMOGLOBIN A1C
Hgb A1c MFr Bld: 7.9 % — ABNORMAL HIGH (ref 4.8–5.6)
Mean Plasma Glucose: 180 mg/dL

## 2019-07-27 MED ORDER — VANCOMYCIN HCL 1500 MG/300ML IV SOLN
1500.0000 mg | INTRAVENOUS | Status: AC
  Administered 2019-07-30: 1500 mg via INTRAVENOUS
  Filled 2019-07-27: qty 300

## 2019-07-27 NOTE — Progress Notes (Signed)
Anesthesia Chart Review:  Case: [id number] Date/Time: 07/30/19 0715   Procedure: ACDF - C4-C5 - C5-C6 (N/A ) - 3C   Anesthesia type: General   Pre-op diagnosis: Stenosis   Location: [facility] OR ROOM 46 / [facility]   Surgeons: Ashley Moore, MD      DISCUSSION: Patient is a 64 year old female scheduled for the above procedure.  History includes never smoker, postoperative N/V, HTN, DM2, HLD, CKD stage II, RA, cholecystectomy, hysterectomy, C3-7 posterior cervical laminoplasty (08/23/17). BMI is consistent with obesity.  Preoperative cardiology input outlined by Ashley Memos, NP on 07/05/2019, "Given past medical history and time since last visit, based on ACC/AHA guidelines, Ashley Monroe would be at acceptable risk for the planned procedure without further cardiovascular testing.   She may hold her aspirin for 7 days prior to the procedure and resume as soon as hemostasis is achieved."  [employer] COVID-19 vaccine received 05/05/2019.  07/26/2019 presurgical COVID-19 test negative.  Anesthesia team to evaluate on the day of surgery.   VS: BP 135/77    Pulse 79    Temp 36.9 C (Oral)    Resp 18    Ht 5' 5.5" (1.664 m)    Wt 102.7 kg    SpO2 96%    BMI 37.11 kg/m    PROVIDERS: Ashley Chard, MD is PCP - Ashley Latch, MD is cardiologist last evaluation 06/01/2019.  Previous work-up for atypical chest pain in 2018, with symptoms ultimately felt due to cervical disc disease (s/p surgery in 2019).  Again reported some symptoms in her neck and chest with repetitive movements but not with exertional exercises such as walking up inclines or stairs.  No repeat ischemic evaluation recommended. Ashley Merino, MD is rheumatologist   LABS: Labs reviewed: Acceptable for surgery.  AST and ALT within normal limits 04/25/2019. (all labs ordered are listed, but only abnormal results are displayed)  Labs Reviewed  GLUCOSE, CAPILLARY - Abnormal; Notable for the following components:      Result  Value   Glucose-Capillary 118 (*)    All other components within normal limits  BASIC METABOLIC PANEL - Abnormal; Notable for the following components:   Glucose, Bld 125 (*)    All other components within normal limits  HEMOGLOBIN A1C - Abnormal; Notable for the following components:   Hgb A1c MFr Bld 7.9 (*)    All other components within normal limits  SURGICAL PCR SCREEN  CBC WITH DIFFERENTIAL/PLATELET  PROTIME-INR     IMAGES: CXR 07/26/19: FINDINGS: The lungs are clear. There is no pleural effusion pneumothorax. The cardiac silhouette is within normal limits. Degenerative changes of the spine. No acute osseous pathology. IMPRESSION: No active cardiopulmonary disease.  CT C-spine 06/26/19: IMPRESSION: 1. Interval C3-C6 laminoplasty with mild residual spinal stenosis at C6-7. 2. Moderate bilateral neural foraminal stenosis at C4-5 and C5-6.    EKG: 06/01/2019 ([facility]): Normal sinus rhythm.  Nonspecific T wave abnormality.   CV: ETT 08/24/16:  Upsloping ST segment depression ST segment depression of 0.5 mm was noted during stress in the III, aVF, V4, V5 and V6 leads, and returning to baseline after 1-5 minutes of recovery.  Exercise time 5:10. Fatigue. Non diagnostic ST changes  [facility] treadmill score is 5. Overall low risk. Ashley Furbish, MD    Past Medical History:  Diagnosis Date   Allergy    Arthritis    RA   Carpal tunnel syndrome    Chronic kidney disease    Diabetes mellitus without complication ([place])  Essential hypertension 06/01/2019   Hyperlipidemia 08/19/2016   Hypertension    PONV (postoperative nausea and vomiting)     Past Surgical History:  Procedure Laterality Date   ABDOMINAL HYSTERECTOMY  2003   CERVICAL SPINE SURGERY  08/2017   CHOLECYSTECTOMY  1982   OVARIAN CYST REMOVAL  1981    MEDICATIONS:  amLODipine (NORVASC) 10 MG tablet   aspirin EC 81 MG tablet   cetirizine (ZYRTEC) 10 MG tablet   cyclobenzaprine  (FLEXERIL) 10 MG tablet   gabapentin (NEURONTIN) 300 MG capsule   hydroxychloroquine (PLAQUENIL) 200 MG tablet   Insulin Detemir (LEVEMIR FLEXTOUCH) 100 UNIT/ML Pen   metFORMIN (GLUCOPHAGE) 500 MG tablet   olmesartan-hydrochlorothiazide (BENICAR HCT) 40-25 MG tablet   OZEMPIC, 0.25 OR 0.5 MG/DOSE, 2 MG/1.5ML SOPN   rosuvastatin (CRESTOR) 20 MG tablet   No current facility-administered medications for this encounter.    [START ON 07/30/2019] vancomycin (VANCOREADY) IVPB 1500 mg/300 mL    Ashley Chock, PA-C Surgical Short Stay/Anesthesiology [facility] Phone [phone] [facility] Phone [phone] 07/27/2019 10:55 AM

## 2019-07-29 NOTE — Anesthesia Preprocedure Evaluation (Addendum)
Anesthesia Evaluation  Patient identified by MRN, date of birth, ID band Patient awake    Reviewed: Allergy & Precautions, H&P , NPO status , Patient's Chart, lab work & pertinent test results  History of Anesthesia Complications (+) PONV  Airway Mallampati: III  TM Distance: >3 FB Neck ROM: Full    Dental no notable dental hx. (+) Teeth Intact, Dental Advisory Given   Pulmonary neg pulmonary ROS,    Pulmonary exam normal breath sounds clear to auscultation       Cardiovascular Exercise Tolerance: Good hypertension, Pt. on medications  Rhythm:Regular Rate:Normal     Neuro/Psych negative neurological ROS  negative psych ROS   GI/Hepatic negative GI ROS, Neg liver ROS,   Endo/Other  diabetes, Insulin Dependent, Oral Hypoglycemic AgentsMorbid obesity  Renal/GU negative Renal ROS  negative genitourinary   Musculoskeletal  (+) Arthritis , Osteoarthritis,    Abdominal   Peds  Hematology negative hematology ROS (+)   Anesthesia Other Findings   Reproductive/Obstetrics negative OB ROS                            Anesthesia Physical Anesthesia Plan  ASA: III  Anesthesia Plan: General   Post-op Pain Management:    Induction: Intravenous  PONV Risk Score and Plan: 4 or greater and Ondansetron, Midazolam, Scopolamine patch - Pre-op, Propofol infusion and Diphenhydramine  Airway Management Planned: Oral ETT and Video Laryngoscope Planned  Additional Equipment:   Intra-op Plan:   Post-operative Plan: Extubation in OR  Informed Consent: I have reviewed the patients History and Physical, chart, labs and discussed the procedure including the risks, benefits and alternatives for the proposed anesthesia with the patient or authorized representative who has indicated his/her understanding and acceptance.     Dental advisory given  Plan Discussed with: CRNA  Anesthesia Plan Comments:         Anesthesia Quick Evaluation

## 2019-07-30 ENCOUNTER — Inpatient Hospital Stay (HOSPITAL_COMMUNITY): Admission: RE | Admit: 2019-07-30 | Payer: BC Managed Care – PPO | Source: Ambulatory Visit

## 2019-07-30 ENCOUNTER — Encounter (HOSPITAL_COMMUNITY): Payer: Self-pay | Admitting: Neurological Surgery

## 2019-07-30 ENCOUNTER — Other Ambulatory Visit (HOSPITAL_COMMUNITY): Payer: BC Managed Care – PPO

## 2019-07-30 ENCOUNTER — Observation Stay (HOSPITAL_COMMUNITY)
Admission: RE | Admit: 2019-07-30 | Discharge: 2019-07-31 | Disposition: A | Discharge status: Home / Self Care | Payer: BC Managed Care – PPO | Source: Ambulatory Visit | Attending: Neurological Surgery | Admitting: Neurological Surgery

## 2019-07-30 ENCOUNTER — Other Ambulatory Visit: Payer: Self-pay

## 2019-07-30 ENCOUNTER — Encounter (HOSPITAL_COMMUNITY)
Admission: RE | Disposition: A | Discharge status: Home / Self Care | Payer: Self-pay | Source: Ambulatory Visit | Attending: Neurological Surgery

## 2019-07-30 ENCOUNTER — Ambulatory Visit (HOSPITAL_COMMUNITY): Payer: BC Managed Care – PPO

## 2019-07-30 ENCOUNTER — Ambulatory Visit (HOSPITAL_COMMUNITY): Payer: BC Managed Care – PPO | Admitting: Vascular Surgery

## 2019-07-30 ENCOUNTER — Ambulatory Visit (HOSPITAL_COMMUNITY): Payer: BC Managed Care – PPO | Admitting: Anesthesiology

## 2019-07-30 DIAGNOSIS — E785 Hyperlipidemia, unspecified: Secondary | ICD-10-CM | POA: Insufficient documentation

## 2019-07-30 DIAGNOSIS — M069 Rheumatoid arthritis, unspecified: Secondary | ICD-10-CM | POA: Insufficient documentation

## 2019-07-30 DIAGNOSIS — Z7982 Long term (current) use of aspirin: Secondary | ICD-10-CM | POA: Insufficient documentation

## 2019-07-30 DIAGNOSIS — E1122 Type 2 diabetes mellitus with diabetic chronic kidney disease: Secondary | ICD-10-CM | POA: Insufficient documentation

## 2019-07-30 DIAGNOSIS — N189 Chronic kidney disease, unspecified: Secondary | ICD-10-CM | POA: Insufficient documentation

## 2019-07-30 DIAGNOSIS — Z833 Family history of diabetes mellitus: Secondary | ICD-10-CM | POA: Insufficient documentation

## 2019-07-30 DIAGNOSIS — M2578 Osteophyte, vertebrae: Secondary | ICD-10-CM | POA: Diagnosis not present

## 2019-07-30 DIAGNOSIS — I129 Hypertensive chronic kidney disease with stage 1 through stage 4 chronic kidney disease, or unspecified chronic kidney disease: Secondary | ICD-10-CM | POA: Diagnosis not present

## 2019-07-30 DIAGNOSIS — Z6837 Body mass index (BMI) 37.0-37.9, adult: Secondary | ICD-10-CM | POA: Diagnosis not present

## 2019-07-30 DIAGNOSIS — M4722 Other spondylosis with radiculopathy, cervical region: Principal | ICD-10-CM | POA: Insufficient documentation

## 2019-07-30 DIAGNOSIS — Z794 Long term (current) use of insulin: Secondary | ICD-10-CM | POA: Diagnosis not present

## 2019-07-30 DIAGNOSIS — M50121 Cervical disc disorder at C4-C5 level with radiculopathy: Secondary | ICD-10-CM | POA: Insufficient documentation

## 2019-07-30 DIAGNOSIS — M4802 Spinal stenosis, cervical region: Secondary | ICD-10-CM | POA: Insufficient documentation

## 2019-07-30 DIAGNOSIS — Z885 Allergy status to narcotic agent status: Secondary | ICD-10-CM | POA: Diagnosis not present

## 2019-07-30 DIAGNOSIS — Z8249 Family history of ischemic heart disease and other diseases of the circulatory system: Secondary | ICD-10-CM | POA: Diagnosis not present

## 2019-07-30 DIAGNOSIS — Z880 Allergy status to penicillin: Secondary | ICD-10-CM | POA: Diagnosis not present

## 2019-07-30 DIAGNOSIS — Z981 Arthrodesis status: Secondary | ICD-10-CM

## 2019-07-30 DIAGNOSIS — Z79899 Other long term (current) drug therapy: Secondary | ICD-10-CM | POA: Insufficient documentation

## 2019-07-30 DIAGNOSIS — Z419 Encounter for procedure for purposes other than remedying health state, unspecified: Secondary | ICD-10-CM

## 2019-07-30 HISTORY — PX: ANTERIOR CERVICAL DECOMP/DISCECTOMY FUSION: SHX1161

## 2019-07-30 LAB — GLUCOSE, CAPILLARY
Glucose-Capillary: 151 mg/dL — ABNORMAL HIGH (ref 70–99)
Glucose-Capillary: 181 mg/dL — ABNORMAL HIGH (ref 70–99)
Glucose-Capillary: 212 mg/dL — ABNORMAL HIGH (ref 70–99)
Glucose-Capillary: 191 mg/dL — ABNORMAL HIGH (ref 70–99)
Glucose-Capillary: 253 mg/dL — ABNORMAL HIGH (ref 70–99)

## 2019-07-30 SURGERY — ANTERIOR CERVICAL DECOMPRESSION/DISCECTOMY FUSION 2 LEVELS
Anesthesia: General

## 2019-07-30 MED ORDER — FENTANYL CITRATE (PF) 250 MCG/5ML IJ SOLN
INTRAMUSCULAR | Status: AC
  Filled 2019-07-30: qty 5

## 2019-07-30 MED ORDER — PROPOFOL 10 MG/ML IV BOLUS
INTRAVENOUS | Status: AC
  Filled 2019-07-30: qty 20

## 2019-07-30 MED ORDER — ROCURONIUM BROMIDE 10 MG/ML (PF) SYRINGE
PREFILLED_SYRINGE | INTRAVENOUS | Status: DC | PRN
  Administered 2019-07-30: 100 mg via INTRAVENOUS

## 2019-07-30 MED ORDER — THROMBIN 5000 UNITS EX SOLR
OROMUCOSAL | Status: DC | PRN
  Administered 2019-07-30: 5 mL via TOPICAL

## 2019-07-30 MED ORDER — OXYCODONE HCL 5 MG PO TABS
5.0000 mg | ORAL_TABLET | ORAL | Status: DC | PRN
  Administered 2019-07-30 – 2019-07-31 (×6): 5 mg via ORAL
  Filled 2019-07-30 (×6): qty 1

## 2019-07-30 MED ORDER — PHENYLEPHRINE 40 MCG/ML (10ML) SYRINGE FOR IV PUSH (FOR BLOOD PRESSURE SUPPORT)
PREFILLED_SYRINGE | INTRAVENOUS | Status: AC
  Filled 2019-07-30: qty 10

## 2019-07-30 MED ORDER — GABAPENTIN 300 MG PO CAPS: 300.0000 mg | ORAL_CAPSULE | Freq: Three times a day (TID) | ORAL | Status: DC | PRN

## 2019-07-30 MED ORDER — ONDANSETRON HCL 4 MG/2ML IJ SOLN: 4.0000 mg | Freq: Four times a day (QID) | INTRAMUSCULAR | Status: DC | PRN

## 2019-07-30 MED ORDER — PROPOFOL 500 MG/50ML IV EMUL
INTRAVENOUS | Status: DC | PRN
Start: 2019-07-30 — End: 2019-07-30
  Administered 2019-07-30: 30 ug/kg/min via INTRAVENOUS

## 2019-07-30 MED ORDER — ACETAMINOPHEN 500 MG PO TABS
1000.0000 mg | ORAL_TABLET | Freq: Once | ORAL | Status: AC
  Administered 2019-07-30: 1000 mg via ORAL
  Filled 2019-07-30: qty 2

## 2019-07-30 MED ORDER — 0.9 % SODIUM CHLORIDE (POUR BTL) OPTIME
TOPICAL | Status: DC | PRN
  Administered 2019-07-30: 1000 mL

## 2019-07-30 MED ORDER — SCOPOLAMINE 1 MG/3DAYS TD PT72
MEDICATED_PATCH | TRANSDERMAL | Status: AC
  Administered 2019-07-30: 1.5 mg via TRANSDERMAL
  Filled 2019-07-30: qty 1

## 2019-07-30 MED ORDER — DEXAMETHASONE SODIUM PHOSPHATE 10 MG/ML IJ SOLN
INTRAMUSCULAR | Status: DC | PRN
  Administered 2019-07-30: 10 mg via INTRAVENOUS

## 2019-07-30 MED ORDER — SODIUM CHLORIDE 0.9% FLUSH
3.0000 mL | Freq: Two times a day (BID) | INTRAVENOUS | Status: DC
  Administered 2019-07-30: 3 mL via INTRAVENOUS

## 2019-07-30 MED ORDER — DEXAMETHASONE SODIUM PHOSPHATE 10 MG/ML IJ SOLN
10.0000 mg | Freq: Once | INTRAMUSCULAR | Status: DC
  Filled 2019-07-30: qty 1

## 2019-07-30 MED ORDER — OLMESARTAN MEDOXOMIL-HCTZ 40-25 MG PO TABS: 1.0000 | ORAL_TABLET | Freq: Every day | ORAL | Status: DC

## 2019-07-30 MED ORDER — METFORMIN HCL 500 MG PO TABS
500.0000 mg | ORAL_TABLET | Freq: Two times a day (BID) | ORAL | Status: DC
  Administered 2019-07-30 – 2019-07-31 (×2): 500 mg via ORAL
  Filled 2019-07-30 (×2): qty 1

## 2019-07-30 MED ORDER — PHENOL 1.4 % MT LIQD: 1.0000 | OROMUCOSAL | Status: DC | PRN

## 2019-07-30 MED ORDER — PHENYLEPHRINE 40 MCG/ML (10ML) SYRINGE FOR IV PUSH (FOR BLOOD PRESSURE SUPPORT)
PREFILLED_SYRINGE | INTRAVENOUS | Status: DC | PRN
  Administered 2019-07-30 (×2): 80 ug via INTRAVENOUS

## 2019-07-30 MED ORDER — THROMBIN 5000 UNITS EX SOLR
CUTANEOUS | Status: AC
  Filled 2019-07-30: qty 5000

## 2019-07-30 MED ORDER — CHLORHEXIDINE GLUCONATE 0.12 % MT SOLN
15.0000 mL | Freq: Once | OROMUCOSAL | Status: AC
  Administered 2019-07-30: 15 mL via OROMUCOSAL
  Filled 2019-07-30: qty 15

## 2019-07-30 MED ORDER — BUPIVACAINE HCL (PF) 0.25 % IJ SOLN
INTRAMUSCULAR | Status: DC | PRN
  Administered 2019-07-30: 4 mL

## 2019-07-30 MED ORDER — PROPOFOL 1000 MG/100ML IV EMUL
INTRAVENOUS | Status: AC
  Filled 2019-07-30: qty 100

## 2019-07-30 MED ORDER — ONDANSETRON HCL 4 MG PO TABS: 4.0000 mg | ORAL_TABLET | Freq: Four times a day (QID) | ORAL | Status: DC | PRN

## 2019-07-30 MED ORDER — AMLODIPINE BESYLATE 5 MG PO TABS
10.0000 mg | ORAL_TABLET | Freq: Every day | ORAL | Status: DC
  Administered 2019-07-30: 10 mg via ORAL
  Filled 2019-07-30: qty 2

## 2019-07-30 MED ORDER — HYDROXYCHLOROQUINE SULFATE 200 MG PO TABS
200.0000 mg | ORAL_TABLET | Freq: Two times a day (BID) | ORAL | Status: DC
  Administered 2019-07-30 (×2): 200 mg via ORAL
  Filled 2019-07-30 (×4): qty 1

## 2019-07-30 MED ORDER — ONDANSETRON HCL 4 MG/2ML IJ SOLN
INTRAMUSCULAR | Status: AC
  Filled 2019-07-30: qty 2

## 2019-07-30 MED ORDER — BUPIVACAINE HCL (PF) 0.25 % IJ SOLN
INTRAMUSCULAR | Status: AC
  Filled 2019-07-30: qty 30

## 2019-07-30 MED ORDER — SODIUM CHLORIDE 0.9% FLUSH: 3.0000 mL | INTRAVENOUS | Status: DC | PRN

## 2019-07-30 MED ORDER — ROCURONIUM BROMIDE 10 MG/ML (PF) SYRINGE
PREFILLED_SYRINGE | INTRAVENOUS | Status: AC
  Filled 2019-07-30: qty 10

## 2019-07-30 MED ORDER — POTASSIUM CHLORIDE IN NACL 20-0.9 MEQ/L-% IV SOLN: INTRAVENOUS | Status: DC

## 2019-07-30 MED ORDER — PHENYLEPHRINE HCL-NACL 10-0.9 MG/250ML-% IV SOLN
INTRAVENOUS | Status: DC | PRN
Start: 2019-07-30 — End: 2019-07-30
  Administered 2019-07-30: 25 ug/min via INTRAVENOUS

## 2019-07-30 MED ORDER — IRBESARTAN 300 MG PO TABS
300.0000 mg | ORAL_TABLET | Freq: Every day | ORAL | Status: DC
  Administered 2019-07-30: 300 mg via ORAL
  Filled 2019-07-30 (×2): qty 1

## 2019-07-30 MED ORDER — SCOPOLAMINE 1 MG/3DAYS TD PT72
1.0000 | MEDICATED_PATCH | TRANSDERMAL | Status: DC
  Filled 2019-07-30: qty 1

## 2019-07-30 MED ORDER — INSULIN ASPART 100 UNIT/ML ~~LOC~~ SOLN
0.0000 [IU] | Freq: Three times a day (TID) | SUBCUTANEOUS | Status: DC
  Administered 2019-07-30: 5 [IU] via SUBCUTANEOUS
  Administered 2019-07-30: 3 [IU] via SUBCUTANEOUS

## 2019-07-30 MED ORDER — LACTATED RINGERS IV SOLN: INTRAVENOUS | Status: DC | PRN

## 2019-07-30 MED ORDER — MENTHOL 3 MG MT LOZG: 1.0000 | LOZENGE | OROMUCOSAL | Status: DC | PRN

## 2019-07-30 MED ORDER — MIDAZOLAM HCL 5 MG/5ML IJ SOLN
INTRAMUSCULAR | Status: DC | PRN
  Administered 2019-07-30: 2 mg via INTRAVENOUS

## 2019-07-30 MED ORDER — SENNA 8.6 MG PO TABS
1.0000 | ORAL_TABLET | Freq: Two times a day (BID) | ORAL | Status: DC
  Administered 2019-07-30: 8.6 mg via ORAL
  Filled 2019-07-30: qty 1

## 2019-07-30 MED ORDER — SUGAMMADEX SODIUM 200 MG/2ML IV SOLN
INTRAVENOUS | Status: DC | PRN
  Administered 2019-07-30: 200 mg via INTRAVENOUS

## 2019-07-30 MED ORDER — ACETAMINOPHEN 325 MG PO TABS: 650.0000 mg | ORAL_TABLET | ORAL | Status: DC | PRN

## 2019-07-30 MED ORDER — ONDANSETRON HCL 4 MG/2ML IJ SOLN
INTRAMUSCULAR | Status: DC | PRN
  Administered 2019-07-30: 4 mg via INTRAVENOUS

## 2019-07-30 MED ORDER — ACETAMINOPHEN 650 MG RE SUPP: 650.0000 mg | RECTAL | Status: DC | PRN

## 2019-07-30 MED ORDER — CEFAZOLIN SODIUM-DEXTROSE 2-4 GM/100ML-% IV SOLN
2.0000 g | Freq: Three times a day (TID) | INTRAVENOUS | Status: AC
  Administered 2019-07-30 (×2): 2 g via INTRAVENOUS
  Filled 2019-07-30 (×2): qty 100

## 2019-07-30 MED ORDER — PROPOFOL 10 MG/ML IV BOLUS
INTRAVENOUS | Status: DC | PRN
  Administered 2019-07-30: 150 mg via INTRAVENOUS
  Administered 2019-07-30: 50 mg via INTRAVENOUS

## 2019-07-30 MED ORDER — HYDROMORPHONE HCL 1 MG/ML IJ SOLN: 0.2500 mg | INTRAMUSCULAR | Status: DC | PRN

## 2019-07-30 MED ORDER — SODIUM CHLORIDE 0.9 % IV SOLN: 250.0000 mL | INTRAVENOUS | Status: DC

## 2019-07-30 MED ORDER — FENTANYL CITRATE (PF) 250 MCG/5ML IJ SOLN
INTRAMUSCULAR | Status: DC | PRN
  Administered 2019-07-30: 100 ug via INTRAVENOUS
  Administered 2019-07-30 (×4): 50 ug via INTRAVENOUS

## 2019-07-30 MED ORDER — HYDROCHLOROTHIAZIDE 25 MG PO TABS
25.0000 mg | ORAL_TABLET | Freq: Every day | ORAL | Status: DC
  Administered 2019-07-30: 25 mg via ORAL
  Filled 2019-07-30: qty 1

## 2019-07-30 MED ORDER — MIDAZOLAM HCL 2 MG/2ML IJ SOLN
INTRAMUSCULAR | Status: AC
  Filled 2019-07-30: qty 2

## 2019-07-30 MED ORDER — ORAL CARE MOUTH RINSE: 15.0000 mL | Freq: Once | OROMUCOSAL | Status: AC

## 2019-07-30 MED ORDER — LIDOCAINE 2% (20 MG/ML) 5 ML SYRINGE
INTRAMUSCULAR | Status: AC
  Filled 2019-07-30: qty 5

## 2019-07-30 MED ORDER — MORPHINE SULFATE (PF) 2 MG/ML IV SOLN: 2.0000 mg | INTRAVENOUS | Status: DC | PRN

## 2019-07-30 MED ORDER — CYCLOBENZAPRINE HCL 10 MG PO TABS
10.0000 mg | ORAL_TABLET | Freq: Every evening | ORAL | Status: DC | PRN
  Administered 2019-07-30: 10 mg via ORAL
  Filled 2019-07-30: qty 1

## 2019-07-30 MED ORDER — LIDOCAINE 2% (20 MG/ML) 5 ML SYRINGE
INTRAMUSCULAR | Status: DC | PRN
  Administered 2019-07-30: 100 mg via INTRAVENOUS

## 2019-07-30 MED ORDER — SODIUM CHLORIDE 0.9 % IV SOLN
INTRAVENOUS | Status: DC | PRN
  Administered 2019-07-30: 500 mL

## 2019-07-30 MED ORDER — LACTATED RINGERS IV SOLN
INTRAVENOUS | Status: DC | PRN
Start: 2019-07-30 — End: 2019-07-30

## 2019-07-30 MED ORDER — INSULIN ASPART 100 UNIT/ML ~~LOC~~ SOLN
0.0000 [IU] | Freq: Three times a day (TID) | SUBCUTANEOUS | Status: DC
  Administered 2019-07-31: 3 [IU] via SUBCUTANEOUS

## 2019-07-30 MED ORDER — INSULIN ASPART 100 UNIT/ML ~~LOC~~ SOLN
0.0000 [IU] | Freq: Every day | SUBCUTANEOUS | Status: DC
  Administered 2019-07-30: 3 [IU] via SUBCUTANEOUS

## 2019-07-30 SURGICAL SUPPLY — 63 items
ADH SKN CLS APL DERMABOND .7 (GAUZE/BANDAGES/DRESSINGS) ×1
APL SKNCLS STERI-STRIP NONHPOA (GAUZE/BANDAGES/DRESSINGS) ×1
BAG DECANTER FOR FLEXI CONT (MISCELLANEOUS) ×3 IMPLANT
BAND INSRT 18 STRL LF DISP RB (MISCELLANEOUS) ×2
BAND RUBBER #18 3X1/16 STRL (MISCELLANEOUS) ×6 IMPLANT
BASKET BONE COLLECTION (BASKET) ×2 IMPLANT
BENZOIN TINCTURE PRP APPL 2/3 (GAUZE/BANDAGES/DRESSINGS) ×3 IMPLANT
BIT DRILL 14MM (INSTRUMENTS) IMPLANT
BUR CARBIDE MATCH 3.0 (BURR) ×3 IMPLANT
CANISTER SUCT 3000ML PPV (MISCELLANEOUS) ×3 IMPLANT
CARTRIDGE OIL MAESTRO DRILL (MISCELLANEOUS) ×1 IMPLANT
CLOSURE WOUND 1/2 X4 (GAUZE/BANDAGES/DRESSINGS) ×1
COVER WAND RF STERILE (DRAPES) ×3 IMPLANT
DERMABOND ADVANCED (GAUZE/BANDAGES/DRESSINGS) ×2
DERMABOND ADVANCED .7 DNX12 (GAUZE/BANDAGES/DRESSINGS) IMPLANT
DIFFUSER DRILL AIR PNEUMATIC (MISCELLANEOUS) ×3 IMPLANT
DRAPE C-ARM 42X72 X-RAY (DRAPES) ×8 IMPLANT
DRAPE LAPAROTOMY 100X72 PEDS (DRAPES) ×3 IMPLANT
DRAPE MICROSCOPE LEICA (MISCELLANEOUS) ×3 IMPLANT
DRILL 14MM (INSTRUMENTS) ×3
DRSG OPSITE POSTOP 3X4 (GAUZE/BANDAGES/DRESSINGS) ×2 IMPLANT
DURAPREP 6ML APPLICATOR 50/CS (WOUND CARE) ×3 IMPLANT
ELECT COATED BLADE 2.86 ST (ELECTRODE) ×3 IMPLANT
ELECT REM PT RETURN 9FT ADLT (ELECTROSURGICAL) ×3
ELECTRODE REM PT RTRN 9FT ADLT (ELECTROSURGICAL) ×1 IMPLANT
GAUZE 4X4 16PLY RFD (DISPOSABLE) IMPLANT
GLOVE BIO SURGEON STRL SZ7 (GLOVE) IMPLANT
GLOVE BIO SURGEON STRL SZ8 (GLOVE) ×3 IMPLANT
GLOVE BIOGEL PI IND STRL 7.0 (GLOVE) IMPLANT
GLOVE BIOGEL PI INDICATOR 7.0 (GLOVE)
GLOVE SS BIOGEL STRL SZ 7 (GLOVE) IMPLANT
GLOVE SUPERSENSE BIOGEL SZ 7 (GLOVE) ×6
GLOVE SURG SS PI 7.5 STRL IVOR (GLOVE) ×2 IMPLANT
GLOVE SURG SS PI 8.0 STRL IVOR (GLOVE) ×2 IMPLANT
GOWN STRL REUS W/ TWL LRG LVL3 (GOWN DISPOSABLE) IMPLANT
GOWN STRL REUS W/ TWL XL LVL3 (GOWN DISPOSABLE) ×1 IMPLANT
GOWN STRL REUS W/TWL 2XL LVL3 (GOWN DISPOSABLE) IMPLANT
GOWN STRL REUS W/TWL LRG LVL3 (GOWN DISPOSABLE) ×3
GOWN STRL REUS W/TWL XL LVL3 (GOWN DISPOSABLE) ×9
HEMOSTAT POWDER KIT SURGIFOAM (HEMOSTASIS) ×3 IMPLANT
KIT BASIN OR (CUSTOM PROCEDURE TRAY) ×3 IMPLANT
KIT TURNOVER KIT B (KITS) ×3 IMPLANT
NDL HYPO 25X1 1.5 SAFETY (NEEDLE) ×1 IMPLANT
NDL SPNL 20GX3.5 QUINCKE YW (NEEDLE) ×1 IMPLANT
NEEDLE HYPO 25X1 1.5 SAFETY (NEEDLE) ×3 IMPLANT
NEEDLE SPNL 20GX3.5 QUINCKE YW (NEEDLE) ×3 IMPLANT
NS IRRIG 1000ML POUR BTL (IV SOLUTION) ×3 IMPLANT
OIL CARTRIDGE MAESTRO DRILL (MISCELLANEOUS) ×3
PACK LAMINECTOMY NEURO (CUSTOM PROCEDURE TRAY) ×3 IMPLANT
PAD ARMBOARD 7.5X6 YLW CONV (MISCELLANEOUS) ×3 IMPLANT
PIN DISTRACTION 14MM (PIN) ×6 IMPLANT
PLATE TRESTLE LUXE 32L L2 (Plate) ×2 IMPLANT
SCREW CANN 4X16 SS S/DRILL (Screw) ×12 IMPLANT
SPACER PTI-C 7X16X14 IDENTI (Spacer) ×2 IMPLANT
SPACER PTI-C 8X16X14 7DEG (Spacer) ×2 IMPLANT
SPONGE INTESTINAL PEANUT (DISPOSABLE) ×3 IMPLANT
SPONGE SURGIFOAM ABS GEL SZ50 (HEMOSTASIS) ×3 IMPLANT
STRIP CLOSURE SKIN 1/2X4 (GAUZE/BANDAGES/DRESSINGS) ×2 IMPLANT
SUT VIC AB 3-0 SH 8-18 (SUTURE) ×6 IMPLANT
SUT VICRYL 4-0 PS2 18IN ABS (SUTURE) ×2 IMPLANT
TOWEL GREEN STERILE (TOWEL DISPOSABLE) ×3 IMPLANT
TOWEL GREEN STERILE FF (TOWEL DISPOSABLE) ×3 IMPLANT
WATER STERILE IRR 1000ML POUR (IV SOLUTION) ×3 IMPLANT

## 2019-07-30 NOTE — H&P (Signed)
Subjective:   Patient is a 64 y.o. female admitted for acdf. The patient first presented to me with complaints of neck pain and shooting pains in the arm(s). Onset of symptoms was several months ago. The pain is described as aching and occurs all day. The pain is rated severe, and is located in the neck and radiates to the R arm. The symptoms have been progressive. Symptoms are exacerbated by none, and are relieved by none.  Previous work up includes MRI of cervical spine, results: spinal stenosis.  Past Medical History:  Diagnosis Date  . Allergy   . Arthritis    RA  . Carpal tunnel syndrome   . Chronic kidney disease   . Diabetes mellitus without complication (HCC)   . Essential hypertension 06/01/2019  . Hyperlipidemia 08/19/2016  . Hypertension   . PONV (postoperative nausea and vomiting)     Past Surgical History:  Procedure Laterality Date  . ABDOMINAL HYSTERECTOMY  2003  . CERVICAL SPINE SURGERY  08/2017  . CHOLECYSTECTOMY  1982  . OVARIAN CYST REMOVAL  1981    Allergies  Allergen Reactions  . Amoxicillin Hives  . Codeine Hives    Social History   Tobacco Use  . Smoking status: Never Smoker  . Smokeless tobacco: Never Used  Substance Use Topics  . Alcohol use: Never    Alcohol/week: 0.0 standard drinks    Family History  Problem Relation Age of Onset  . Atrial fibrillation Mother   . Stroke Mother   . Heart attack Mother   . Aneurysm Mother   . Atrial fibrillation Brother   . Stroke Brother   . Diabetes Brother   . Diabetes Father   . Alzheimer's disease Father   . Diabetes Brother   . Alzheimer's disease Maternal Grandmother   . Heart attack Maternal Grandfather   . Cancer Paternal Grandmother   . Healthy Son   . Healthy Daughter    Prior to Admission medications   Medication Sig Start Date End Date Taking? Authorizing Provider  amLODipine (NORVASC) 10 MG tablet Take 1 tablet (10 mg total) by mouth daily. 09/04/18 09/04/19 Yes Dorothyann Peng, MD   aspirin EC 81 MG tablet Take 81 mg by mouth daily.   Yes [provider]  cetirizine (ZYRTEC) 10 MG tablet Take 10 mg by mouth daily.   Yes [provider]  cyclobenzaprine (FLEXERIL) 10 MG tablet Take 10 mg by mouth at bedtime as needed for muscle spasms. 04/14/17  Yes [provider]  gabapentin (NEURONTIN) 300 MG capsule Take 300 mg by mouth 3 (three) times daily as needed (pain).  05/30/19  Yes [provider]  hydroxychloroquine (PLAQUENIL) 200 MG tablet TAKE 1 TABLET BY MOUTH TWICE A DAY Patient taking differently: Take 200 mg by mouth 2 (two) times daily.  11/17/18  Yes Deveshwar, Janalyn Rouse, MD  Insulin Detemir (LEVEMIR FLEXTOUCH) 100 UNIT/ML Pen Inject 10 units subcutaneous daily at bedtime Patient taking differently: Inject 10 Units into the skin at bedtime.  09/04/18  Yes Dorothyann Peng, MD  metFORMIN (GLUCOPHAGE) 500 MG tablet Take 1 tablet (500 mg total) by mouth 2 (two) times daily with a meal. 09/04/18  Yes Dorothyann Peng, MD  olmesartan-hydrochlorothiazide (BENICAR HCT) 40-25 MG tablet TAKE 1 TABLET BY MOUTH EVERY DAY Patient taking differently: Take 1 tablet by mouth daily.  04/25/19  Yes Dorothyann Peng, MD  OZEMPIC, 0.25 OR 0.5 MG/DOSE, 2 MG/1.5ML SOPN Inject 0.5 mg into the skin once a week. 09/04/18  Yes Allyne Gee,  Bailey Mech, MD  rosuvastatin (CRESTOR) 20 MG tablet Take 1 tablet (20 mg total) by mouth every Monday, Wednesday, and Friday. 06/01/19  Yes Skeet Latch, MD     Review of Systems  Positive ROS: neg  All other systems have been reviewed and were otherwise negative with the exception of those mentioned in the HPI and as above.  Objective: Vital signs in last 24 hours: Temp:  [98.8 F (37.1 C)] 98.8 F (37.1 C) (06/21 0643) Pulse Rate:  [76] 76 (06/21 0643) Resp:  [20] 20 (06/21 0643) BP: (126)/(78) 126/78 (06/21 0643) SpO2:  [98 %] 98 % (06/21 0643) Weight:  [102.7 kg] 102.7 kg (06/21 0643)  General Appearance: Alert, cooperative, no  distress, appears stated age Head: Normocephalic, without obvious abnormality, atraumatic Eyes: PERRL, conjunctiva/corneas clear, EOM's intact      Neck: Supple, symmetrical, trachea midline, Back: Symmetric, no curvature, ROM normal, no CVA tenderness Lungs:  respirations unlabored Heart: Regular rate and rhythm Abdomen: Soft, non-tender Extremities: Extremities normal, atraumatic, no cyanosis or edema Pulses: 2+ and symmetric all extremities Skin: Skin color, texture, turgor normal, no rashes or lesions  NEUROLOGIC:  Mental status: Alert and oriented x4, no aphasia, good attention span, fund of knowledge and memory  Motor Exam - grossly normal Sensory Exam - grossly normal Reflexes: 1+ Coordination - grossly normal Gait - grossly normal Balance - grossly normal Cranial Nerves: I: smell Not tested  II: visual acuity  OS: nl    OD: nl  II: visual fields Full to confrontation  II: pupils Equal, round, reactive to light  III,VII: ptosis None  III,IV,VI: extraocular muscles  Full ROM  V: mastication Normal  V: facial light touch sensation  Normal  V,VII: corneal reflex  Present  VII: facial muscle function - upper  Normal  VII: facial muscle function - lower Normal  VIII: hearing Not tested  IX: soft palate elevation  Normal  IX,X: gag reflex Present  XI: trapezius strength  5/5  XI: sternocleidomastoid strength 5/5  XI: neck flexion strength  5/5  XII: tongue strength  Normal    Data Review Lab Results  Component Value Date   WBC 6.3 07/26/2019   HGB 13.4 07/26/2019   HCT 42.7 07/26/2019   MCV 87.1 07/26/2019   PLT 330 07/26/2019   Lab Results  Component Value Date   NA 137 07/26/2019   K 3.6 07/26/2019   CL 102 07/26/2019   CO2 27 07/26/2019   BUN 13 07/26/2019   CREATININE 0.86 07/26/2019   GLUCOSE 125 (H) 07/26/2019   Lab Results  Component Value Date   INR 1.0 07/26/2019    Assessment:   Cervical neck pain with herniated nucleus pulposus/  spondylosis/ stenosis at C4-5 C5-6. Estimated body mass index is 37.1 kg/m as calculated from the following:   Height as of this encounter: 5' 5.5" (1.664 m).   Weight as of this encounter: 102.7 kg.  Patient has failed conservative therapy. Planned surgery : ACDF C4-5 C5-6  Plan:   I explained the condition and procedure to the patient and answered any questions.  Patient wishes to proceed with procedure as planned. Understands risks/ benefits/ and expected or typical outcomes.  Eustace Moore 07/30/2019 7:20 AM

## 2019-07-30 NOTE — Op Note (Signed)
07/30/2019  10:40 AM  PATIENT:  Ashley Monroe  64 y.o. 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 Decompressive anterior cervical discectomy C4-5 and C5-6, 2. Anterior cervical arthrodesis C4-5 C5-6 utilizing a porous titanium interbody cage packed with locally harvested morcellized autologous bone graft, 3. Anterior cervical plating C4-6 inclusive utilizing a Alphatec plate  SURGEON:  Marikay Alar, MD  ASSISTANTS: Verlin Dike, FNP  ANESTHESIA:   General  EBL: 50 ml  Total I/O In: 1100 [I.V.:1100] Out: 50 [Blood:50]  BLOOD ADMINISTERED: none  DRAINS: none  SPECIMEN:  none  INDICATION FOR PROCEDURE: This patient presented with neck and right arm pain. Imaging showed previous decompressive laminoplasty for OPLL but right-sided foraminal stenosis C4-5 and C5-6.  EMG is consistent with right C5 and C6 radiculopathy.. The patient tried conservative measures without relief. Pain was debilitating. Recommended ACDF with plating. Patient understood the risks, benefits, and alternatives and potential outcomes and wished to proceed.  PROCEDURE DETAILS: Patient was brought to the operating room placed under general endotracheal anesthesia. Patient was placed in the supine position on the operating room table. The neck was prepped with Duraprep and draped in a sterile fashion.   Three cc of local anesthesia was injected and a transverse incision was made on the right side of the neck.  Dissection was carried down thru the subcutaneous tissue and the platysma was  elevated, opened, and undermined with Metzenbaum scissors.  Dissection was then carried out thru an avascular plane leaving the sternocleidomastoid carotid artery and jugular vein laterally and the trachea and esophagus medially with the assistance of my nurse  practitioner. The ventral aspect of the vertebral column was identified and a localizing x-ray was taken. The C4-5 and C5-6 level was identified and all in the room agreed with the level. The longus colli muscles were then elevated and the retractor was placed with the assistance of my nurse practitioner.  Large anterior osteophytes were removed from the anterior vertebral bodies of C4-5 and C5-6, the annulus was incised and the disc space entered. Discectomy was performed with micro-curettes and pituitary rongeurs. I then used the high-speed drill to drill the endplates down to the level of the posterior longitudinal ligament. The drill shavings were saved in a mucous trap for later arthrodesis. The operating microscope was draped and brought into the field provided additional magnification, illumination and visualization. Discectomy was continued posteriorly thru the disc space. Posterior longitudinal ligament was partially calcified, but was opened with a nerve hook, and then removed along with disc herniation and osteophytes, decompressing the spinal canal and thecal sac. We then continued to remove osteophytic overgrowth and disc material decompressing the neural foramina and exiting nerve roots bilaterally.  There was significant stenosis over the right C6 nerve root.  The scope was angled up and down to help decompress and undercut the vertebral bodies. Once the decompression was completed we could pass a nerve hook circumferentially to assure adequate decompression in the midline and in the neural foramina. So by both visualization and palpation we felt we had an adequate decompression of the neural elements. We then measured the height of the intravertebral disc space and selected a 7 millimeter titanium interbody cage packed with autograft for C4-5 one 8 mm graft for C5-6.Marland Kitchen It was then gently positioned in the intravertebral disc space(s) and countersunk. I then used a 32  mm Alphatec plate and placed 14 mm  variable angle screws into the vertebral bodies of each level and locked them into position. The wound was irrigated with bacitracin solution, checked for hemostasis which was established and confirmed. Once meticulous hemostasis was achieved, we then proceeded with closure with the assistance of my nurse practitioner. The platysma was closed with interrupted 3-0 undyed Vicryl suture, the subcuticular layer was closed with interrupted 3-0 undyed Vicryl suture. The skin edges were approximated with steristrips. The drapes were removed. A sterile dressing was applied. The patient was then awakened from general anesthesia and transferred to the recovery room in stable condition. At the end of the procedure all sponge, needle and instrument counts were correct.   PLAN OF CARE: Admit for overnight observation  PATIENT DISPOSITION:  PACU - hemodynamically stable.   Delay start of Pharmacological VTE agent (>24hrs) due to surgical blood loss or risk of bleeding:  yes

## 2019-07-30 NOTE — Anesthesia Procedure Notes (Signed)
Procedure Name: Intubation Date/Time: 07/30/2019 7:51 AM Performed by: Nils Pyle, CRNA Pre-anesthesia Checklist: Patient identified, Emergency Drugs available, Suction available and Patient being monitored Patient Re-evaluated:Patient Re-evaluated prior to induction Oxygen Delivery Method: Circle System Utilized Preoxygenation: Pre-oxygenation with 100% oxygen Induction Type: IV induction Ventilation: Mask ventilation without difficulty and Oral airway inserted - appropriate to patient size Laryngoscope Size: Glidescope and 3 Grade View: Grade I Tube type: Oral Tube size: 7.0 mm Number of attempts: 1 Airway Equipment and Method: Stylet,  Oral airway and Video-laryngoscopy Placement Confirmation: ETT inserted through vocal cords under direct vision,  positive ETCO2 and breath sounds checked- equal and bilateral Secured at: 22 cm Tube secured with: Tape Dental Injury: Teeth and Oropharynx as per pre-operative assessment

## 2019-07-30 NOTE — Anesthesia Postprocedure Evaluation (Signed)
Anesthesia Post Note  Patient: Ashley Monroe  Procedure(s) Performed: Anterior Cervicql Decompression Fusion - Cervcial four-Cervical five - Cervical five-Cervical six (N/A )     Patient location during evaluation: PACU Anesthesia Type: General Level of consciousness: awake and alert Pain management: pain level controlled Vital Signs Assessment: post-procedure vital signs reviewed and stable Respiratory status: spontaneous breathing, nonlabored ventilation and respiratory function stable Cardiovascular status: blood pressure returned to baseline and stable Postop Assessment: no apparent nausea or vomiting Anesthetic complications: no   No complications documented.  Last Vitals:  Vitals:   07/30/19 1120 07/30/19 1202  BP: 126/81 117/73  Pulse: 92 91  Resp: 15 18  Temp: 36.6 C 36.7 C  SpO2: 95% 95%    Last Pain:  Vitals:   07/30/19 1202  TempSrc: Oral  PainSc:                  Annalis Kaczmarczyk,W. EDMOND

## 2019-07-30 NOTE — Transfer of Care (Signed)
Immediate Anesthesia Transfer of Care Note  Patient: Ashley Monroe  Procedure(s) Performed: Anterior Cervicql Decompression Fusion - Cervcial four-Cervical five - Cervical five-Cervical six (N/A )  Patient Location: PACU  Anesthesia Type:General  Level of Consciousness: awake and drowsy  Airway & Oxygen Therapy: Patient Spontanous Breathing and Patient connected to face mask oxygen  Post-op Assessment: Report given to RN, Post -op Vital signs reviewed and stable and Patient moving all extremities X 4  Post vital signs: Reviewed and stable  Last Vitals:  Vitals Value Taken Time  BP    Temp    Pulse 100 07/30/19 1051  Resp 16 07/30/19 1051  SpO2 98 % 07/30/19 1051  Vitals shown include unvalidated device data.  Last Pain:  Vitals:   07/30/19 0714  PainSc: 4       Patients Stated Pain Goal: 3 (07/30/19 0714)  Complications: No complications documented.

## 2019-07-31 ENCOUNTER — Encounter (HOSPITAL_COMMUNITY): Payer: Self-pay | Admitting: Neurological Surgery

## 2019-07-31 DIAGNOSIS — M4722 Other spondylosis with radiculopathy, cervical region: Secondary | ICD-10-CM | POA: Diagnosis not present

## 2019-07-31 LAB — GLUCOSE, CAPILLARY: Glucose-Capillary: 159 mg/dL — ABNORMAL HIGH (ref 70–99)

## 2019-07-31 MED ORDER — OXYCODONE HCL 5 MG PO TABS: 5.0000 mg | ORAL_TABLET | ORAL | 0 refills | Status: DC | PRN

## 2019-07-31 NOTE — Plan of Care (Signed)
Pt doing well. Pt given D/C instructions with verbal understanding. Rx's were sent to the pharmacy by MD. Pt's incision is clean and dry with no sign of infection. Pt's IV was removed prior to D/C. Pt D/C'd home via wheelchair per MD order. Pt is stable @ D/C and has no other needs at this time. Sequoia Witz, RN  

## 2019-07-31 NOTE — Discharge Summary (Signed)
Physician Discharge Summary  Patient ID: Ashley Monroe MRN: [id number] DOB/AGE: [birth year] 64 y.o.  Admit date: 07/30/2019 Discharge date: 07/31/2019  Admission Diagnoses: Cervical spondylosis with radiculopathy   Discharge Diagnoses: Same   Discharged Condition: good  Hospital Course: The patient was admitted on 07/30/2019 and taken to the operating room where the patient underwent ACDF C4-5 and C5-6. The patient tolerated the procedure well and was taken to the recovery room and then to the floor in stable condition. The hospital course was routine. There were no complications. The wound remained clean dry and intact. Pt had appropriate neck soreness. No complaints of arm pain or new N/T/W. The patient remained afebrile with stable vital signs, and tolerated a regular diet. The patient continued to increase activities, and pain was well controlled with oral pain medications.   Consults: None  Significant Diagnostic Studies:  Results for orders placed or performed during the hospital encounter of 07/30/19  Glucose, capillary  Result Value Ref Range   Glucose-Capillary 151 (H) 70 - 99 mg/dL   Comment 1 Notify RN   Glucose, capillary  Result Value Ref Range   Glucose-Capillary 181 (H) 70 - 99 mg/dL  Glucose, capillary  Result Value Ref Range   Glucose-Capillary 212 (H) 70 - 99 mg/dL   Comment 1 Notify RN    Comment 2 Document in Chart   Glucose, capillary  Result Value Ref Range   Glucose-Capillary 191 (H) 70 - 99 mg/dL  Glucose, capillary  Result Value Ref Range   Glucose-Capillary 253 (H) 70 - 99 mg/dL   Comment 1 Notify RN    Comment 2 Document in Chart   Glucose, capillary  Result Value Ref Range   Glucose-Capillary 159 (H) 70 - 99 mg/dL   Comment 1 Notify RN    Comment 2 Document in Chart     Chest 2 View  Result Date: 07/26/2019 CLINICAL DATA:  64 year old female with preop chest radiograph. EXAM: CHEST - 2 VIEW COMPARISON:  None FINDINGS: The lungs are  clear. There is no pleural effusion pneumothorax. The cardiac silhouette is within normal limits. Degenerative changes of the spine. No acute osseous pathology. IMPRESSION: No active cardiopulmonary disease. Electronically Signed   By: Elgie Collard M.D.   On: 07/26/2019 16:25   DG Cervical Spine 2-3 Views  Result Date: 07/30/2019 CLINICAL DATA:  Cervical fusion EXAM: CERVICAL SPINE - 2-3 VIEW; DG C-ARM 1-60 MIN COMPARISON:  06/26/2018 FLUOROSCOPY TIME:  Fluoroscopy Time:  10 seconds Radiation Exposure Index (if provided by the fluoroscopic device): 1.84 mGy Number of Acquired Spot Images: 1 FINDINGS: Completion film shows interbody fusion at C4-5 and C5-6 with anterior fixation. Previously placed posterior fixation elements are noted related to wide multilevel laminectomy. IMPRESSION: Anterior fusion at C4-5 and C5-6 Electronically Signed   By: Alcide Clever M.D.   On: 07/30/2019 11:40   DG C-Arm 1-60 Min  Result Date: 07/30/2019 CLINICAL DATA:  Cervical fusion EXAM: CERVICAL SPINE - 2-3 VIEW; DG C-ARM 1-60 MIN COMPARISON:  06/26/2018 FLUOROSCOPY TIME:  Fluoroscopy Time:  10 seconds Radiation Exposure Index (if provided by the fluoroscopic device): 1.84 mGy Number of Acquired Spot Images: 1 FINDINGS: Completion film shows interbody fusion at C4-5 and C5-6 with anterior fixation. Previously placed posterior fixation elements are noted related to wide multilevel laminectomy. IMPRESSION: Anterior fusion at C4-5 and C5-6 Electronically Signed   By: Alcide Clever M.D.   On: 07/30/2019 11:40    Antibiotics:  Anti-infectives (From admission, onward)   Start  Dose/Rate Route Frequency Ordered Stop   07/30/19 1600  ceFAZolin (ANCEF) IVPB 2g/100 mL premix        2 g 200 mL/hr over 30 Minutes Intravenous Every 8 hours 07/30/19 1157 07/31/19 0004   07/30/19 1300  hydroxychloroquine (PLAQUENIL) tablet 200 mg     Discontinue     200 mg Oral 2 times daily 07/30/19 1157     07/30/19 0730  bacitracin 50,000  Units in sodium chloride 0.9 % 500 mL irrigation  Status:  Discontinued          As needed 07/30/19 0836 07/30/19 1045   07/30/19 0600  vancomycin (VANCOREADY) IVPB 1500 mg/300 mL        1,500 mg 150 mL/hr over 120 Minutes Intravenous On call to O.R. 07/27/19 0749 07/30/19 1233      Discharge Exam: Blood pressure 113/67, pulse 70, temperature 98.3 F (36.8 C), temperature source Oral, resp. rate 18, height 5' 5.5" (1.664 m), weight 102.7 kg, SpO2 95 %. Neurologic: Grossly normal Dressing clean dry and intact  Discharge Medications:   Allergies as of 07/31/2019      Reactions   Amoxicillin Hives   Codeine Hives      Medication List    TAKE these medications   amLODipine 10 MG tablet Commonly known as: NORVASC Take 1 tablet (10 mg total) by mouth daily.   aspirin EC 81 MG tablet Take 81 mg by mouth daily.   cetirizine 10 MG tablet Commonly known as: ZYRTEC Take 10 mg by mouth daily.   cyclobenzaprine 10 MG tablet Commonly known as: FLEXERIL Take 10 mg by mouth at bedtime as needed for muscle spasms.   gabapentin 300 MG capsule Commonly known as: NEURONTIN Take 300 mg by mouth 3 (three) times daily as needed (pain).   hydroxychloroquine 200 MG tablet Commonly known as: PLAQUENIL TAKE 1 TABLET BY MOUTH TWICE A DAY   Levemir FlexTouch 100 UNIT/ML FlexPen Generic drug: insulin detemir Inject 10 units subcutaneous daily at bedtime What changed:   how much to take  how to take this  when to take this  additional instructions   metFORMIN 500 MG tablet Commonly known as: GLUCOPHAGE Take 1 tablet (500 mg total) by mouth 2 (two) times daily with a meal.   olmesartan-hydrochlorothiazide 40-25 MG tablet Commonly known as: BENICAR HCT TAKE 1 TABLET BY MOUTH EVERY DAY   oxyCODONE 5 MG immediate release tablet Commonly known as: Oxy IR/ROXICODONE Take 1 tablet (5 mg total) by mouth every 4 (four) hours as needed for moderate pain ((score 4 to 6)).   Ozempic (0.25  or 0.5 MG/DOSE) 2 MG/1.5ML Sopn Generic drug: Semaglutide(0.25 or 0.5MG /DOS) Inject 0.5 mg into the skin once a week.   rosuvastatin 20 MG tablet Commonly known as: CRESTOR Take 1 tablet (20 mg total) by mouth every Monday, Wednesday, and Friday.       Disposition: Home   Final Dx: ACDF C4-5 C5-6  Discharge Instructions     Remove dressing in 72 hours   Complete by: As directed    Call MD for:  difficulty breathing, headache or visual disturbances   Complete by: As directed    Call MD for:  persistant nausea and vomiting   Complete by: As directed    Call MD for:  redness, tenderness, or signs of infection (pain, swelling, redness, odor or green/yellow discharge around incision site)   Complete by: As directed    Call MD for:  severe uncontrolled pain   Complete  by: As directed    Call MD for:  temperature >100.4   Complete by: As directed    Diet - low sodium heart healthy   Complete by: As directed    Increase activity slowly   Complete by: As directed        Follow-up Information    Tia Alert, MD. Schedule an appointment as soon as possible for a visit in 2 week(s).   Specialty: Neurosurgery Contact information: [address] [place] [address] [phone]                Signed: Tia Alert 07/31/2019, 7:13 AM

## 2019-08-02 ENCOUNTER — Other Ambulatory Visit: Payer: Self-pay | Admitting: Internal Medicine

## 2019-08-02 ENCOUNTER — Other Ambulatory Visit: Payer: Self-pay | Admitting: Rheumatology

## 2019-08-02 DIAGNOSIS — M0579 Rheumatoid arthritis with rheumatoid factor of multiple sites without organ or systems involvement: Secondary | ICD-10-CM

## 2019-08-27 ENCOUNTER — Ambulatory Visit: Payer: BC Managed Care – PPO | Admitting: Internal Medicine

## 2019-08-27 ENCOUNTER — Encounter: Payer: Self-pay | Admitting: Internal Medicine

## 2019-08-27 ENCOUNTER — Other Ambulatory Visit: Payer: Self-pay

## 2019-08-27 VITALS — BP 118/76 | HR 87 | Temp 98.0°F | Ht 65.5 in | Wt 224.0 lb

## 2019-08-27 DIAGNOSIS — Z6836 Body mass index (BMI) 36.0-36.9, adult: Secondary | ICD-10-CM

## 2019-08-27 DIAGNOSIS — Z79899 Other long term (current) drug therapy: Secondary | ICD-10-CM | POA: Diagnosis not present

## 2019-08-27 DIAGNOSIS — I10 Essential (primary) hypertension: Secondary | ICD-10-CM

## 2019-08-27 DIAGNOSIS — E1122 Type 2 diabetes mellitus with diabetic chronic kidney disease: Secondary | ICD-10-CM | POA: Diagnosis not present

## 2019-08-27 DIAGNOSIS — N182 Chronic kidney disease, stage 2 (mild): Secondary | ICD-10-CM

## 2019-08-27 NOTE — Progress Notes (Signed)
I,[name],acting as a Neurosurgeon for Ashley Aliment, MD.,have documented all relevant documentation on the behalf of Ashley Aliment, MD,as directed by  Ashley Aliment, MD while in the presence of Ashley Aliment, MD.  This visit occurred during the SARS-CoV-2 public health emergency.  Safety protocols were in place, including screening questions prior to the visit, additional usage of staff PPE, and extensive cleaning of exam room while observing appropriate contact time as indicated for disinfecting solutions.  Subjective:     Patient ID: Ashley Monroe , female    DOB: 1955/08/02 , 64 y.o.   MRN: [id number]   Chief Complaint  Patient presents with  . Diabetes  . Hypertension    HPI  The patient is here for a follow-up on her diabetes.  The patient is currently taking Levemir 10 unites at bedtime, metformin 500 mg 2 times per day and Ozempic 0.5 mg weekly. She reports compliance with meds.   Since her last visit, she underwent anterior cervical decomp/discectomy fusion in June 2021. This was performed by Dr. Yetta Barre. She is doing well post-operatively. Her sx of RUE weakness/paresthesias have improved.   Diabetes She presents for her follow-up diabetic visit. She has type 2 diabetes mellitus. Her disease course has been stable. There are no hypoglycemic associated symptoms. Pertinent negatives for diabetes include no blurred vision and no chest pain. There are no hypoglycemic complications. Risk factors for coronary artery disease include diabetes mellitus, dyslipidemia, hypertension, obesity and sedentary lifestyle. She is following a generally healthy diet. She participates in exercise intermittently. Her breakfast blood glucose is taken between 7-8 am. Her breakfast blood glucose range is generally 130-140 mg/dl. An ACE inhibitor/angiotensin II receptor blocker is being taken. Eye exam is current.  Hypertension This is a chronic problem. The current episode started more than 1  year ago. The problem has been gradually improving since onset. The problem is controlled. Pertinent negatives include no blurred vision, chest pain, palpitations or shortness of breath.   Past Medical History:  Diagnosis Date  . Allergy   . Arthritis    RA  . Carpal tunnel syndrome   . Chronic kidney disease   . Diabetes mellitus without complication (HCC)   . Essential hypertension 06/01/2019  . Hyperlipidemia 08/19/2016  . Hypertension   . PONV (postoperative nausea and vomiting)      Family History  Problem Relation Age of Onset  . Atrial fibrillation Mother   . Stroke Mother   . Heart attack Mother   . Aneurysm Mother   . Atrial fibrillation Brother   . Stroke Brother   . Diabetes Brother   . Diabetes Father   . Alzheimer's disease Father   . Diabetes Brother   . Alzheimer's disease Maternal Grandmother   . Heart attack Maternal Grandfather   . Cancer Paternal Grandmother   . Healthy Son   . Healthy Daughter      Current Outpatient Medications:  .  amLODipine (NORVASC) 10 MG tablet, Take 1 tablet (10 mg total) by mouth daily., Disp: 90 tablet, Rfl: 1 .  aspirin EC 81 MG tablet, Take 81 mg by mouth daily., Disp: , Rfl:  .  cetirizine (ZYRTEC) 10 MG tablet, Take 10 mg by mouth daily., Disp: , Rfl:  .  cyclobenzaprine (FLEXERIL) 10 MG tablet, Take 10 mg by mouth at bedtime as needed for muscle spasms., Disp: , Rfl: 0 .  gabapentin (NEURONTIN) 300 MG capsule, Take 300 mg by mouth 3 (three) times daily  as needed (pain). , Disp: , Rfl:  .  hydroxychloroquine (PLAQUENIL) 200 MG tablet, TAKE 1 TABLET BY MOUTH TWICE A DAY (Patient taking differently: Take 200 mg by mouth 2 (two) times daily. ), Disp: 30 tablet, Rfl: 0 .  LEVEMIR FLEXTOUCH 100 UNIT/ML FlexPen, INJECT 10 UNITS SUBCUTANEOUS DAILY AT BEDTIME, Disp: 15 mL, Rfl: 1 .  metFORMIN (GLUCOPHAGE) 500 MG tablet, Take 1 tablet (500 mg total) by mouth 2 (two) times daily with a meal., Disp: 180 tablet, Rfl: 1 .   olmesartan-hydrochlorothiazide (BENICAR HCT) 40-25 MG tablet, TAKE 1 TABLET BY MOUTH EVERY DAY, Disp: 90 tablet, Rfl: 1 .  oxyCODONE (OXY IR/ROXICODONE) 5 MG immediate release tablet, Take 1 tablet (5 mg total) by mouth every 4 (four) hours as needed for moderate pain ((score 4 to 6))., Disp: 40 tablet, Rfl: 0 .  OZEMPIC, 0.25 OR 0.5 MG/DOSE, 2 MG/1.5ML SOPN, Inject 0.5 mg into the skin once a week., Disp: 3 pen, Rfl: 1 .  rosuvastatin (CRESTOR) 20 MG tablet, Take 1 tablet (20 mg total) by mouth every Monday, Wednesday, and Friday., Disp: 36 tablet, Rfl: 3   Allergies  Allergen Reactions  . Amoxicillin Hives  . Codeine Hives     Review of Systems  Constitutional: Negative.   Eyes: Negative for blurred vision.  Respiratory: Negative.  Negative for shortness of breath.   Cardiovascular: Negative.  Negative for chest pain and palpitations.  Gastrointestinal: Negative.   Psychiatric/Behavioral: Negative.   All other systems reviewed and are negative.    Today's Vitals   08/27/19 1036  BP: 118/76  Pulse: 87  Temp: 98 F (36.7 C)  TempSrc: Oral  Weight: 224 lb (101.6 kg)  Height: 5' 5.5" (1.664 m)   Body mass index is 36.71 kg/m.  Wt Readings from Last 3 Encounters:  08/27/19 224 lb (101.6 kg)  07/30/19 226 lb 6.6 oz (102.7 kg)  07/26/19 226 lb 7 oz (102.7 kg)   Objective:  Physical Exam Vitals and nursing note reviewed.  Constitutional:      Appearance: Normal appearance. She is obese.  HENT:     Head: Normocephalic and atraumatic.  Cardiovascular:     Rate and Rhythm: Normal rate and regular rhythm.     Heart sounds: Normal heart sounds.  Pulmonary:     Effort: Pulmonary effort is normal.     Breath sounds: Normal breath sounds.  Skin:    General: Skin is warm.  Neurological:     General: No focal deficit present.     Mental Status: She is alert and oriented to person, place, and time.  Psychiatric:        Mood and Affect: Mood normal.        Behavior: Behavior  normal.         Assessment And Plan:     1. Type 2 diabetes mellitus with stage 2 chronic kidney disease, without long-term current use of insulin (HCC) Comments: Chronic. The patient reports compliance with meds. I will check labs as listed below.  - Hemoglobin A1c - CMP14+EGFR  2. Essential hypertension Comments: Chronic, well controlled.  The patient is currently taking olmesartan - hydrochlorothiazide 40-25 mg and amlodipine 10 mg daily.  3. Class 2 severe obesity due to excess calories with serious comorbidity and body mass index (BMI) of 36.0 to 36.9 in adult [facility])  He is encouraged to initially strive for BMI less than 30 to decrease cardiac risk. He is advised to exercise no less than 150 minutes  per week.    4. Drug therapy - Vitamin B12  The patient was encouraged to exercise at least 150 minutes per week and to reduce her calorie intake.   Patient was given opportunity to ask questions. Patient verbalized understanding of the plan and was able to repeat key elements of the plan. All questions were answered to their satisfaction.  Maximino Greenland, MD   I, Maximino Greenland, MD, have reviewed all documentation for this visit. The documentation on 08/27/19 for the exam, diagnosis, procedures, and orders are all accurate and complete.  THE PATIENT IS ENCOURAGED TO PRACTICE SOCIAL DISTANCING DUE TO THE COVID-19 PANDEMIC.

## 2019-08-27 NOTE — Patient Instructions (Signed)
Diabetes Mellitus and Exercise Exercising regularly is important for your overall health, especially when you have diabetes (diabetes mellitus). Exercising is not only about losing weight. It has many other health benefits, such as increasing muscle strength and bone density and reducing body fat and stress. This leads to improved fitness, flexibility, and endurance, all of which result in better overall health. Exercise has additional benefits for people with diabetes, including:  Reducing appetite.  Helping to lower and control blood glucose.  Lowering blood pressure.  Helping to control amounts of fatty substances (lipids) in the blood, such as cholesterol and triglycerides.  Helping the body to respond better to insulin (improving insulin sensitivity).  Reducing how much insulin the body needs.  Decreasing the risk for heart disease by: ? Lowering cholesterol and triglyceride levels. ? Increasing the levels of good cholesterol. ? Lowering blood glucose levels. What is my activity plan? Your health care provider or certified diabetes educator can help you make a plan for the type and frequency of exercise (activity plan) that works for you. Make sure that you:  Do at least 150 minutes of moderate-intensity or vigorous-intensity exercise each week. This could be brisk walking, biking, or water aerobics. ? Do stretching and strength exercises, such as yoga or weightlifting, at least 2 times a week. ? Spread out your activity over at least 3 days of the week.  Get some form of physical activity every day. ? Do not go more than 2 days in a row without some kind of physical activity. ? Avoid being inactive for more than 30 minutes at a time. Take frequent breaks to walk or stretch.  Choose a type of exercise or activity that you enjoy, and set realistic goals.  Start slowly, and gradually increase the intensity of your exercise over time. What do I need to know about managing my  diabetes?   Check your blood glucose before and after exercising. ? If your blood glucose is 240 mg/dL (13.3 mmol/L) or higher before you exercise, check your urine for ketones. If you have ketones in your urine, do not exercise until your blood glucose returns to normal. ? If your blood glucose is 100 mg/dL (5.6 mmol/L) or lower, eat a snack containing 15-20 grams of carbohydrate. Check your blood glucose 15 minutes after the snack to make sure that your level is above 100 mg/dL (5.6 mmol/L) before you start your exercise.  Know the symptoms of low blood glucose (hypoglycemia) and how to treat it. Your risk for hypoglycemia increases during and after exercise. Common symptoms of hypoglycemia can include: ? Hunger. ? Anxiety. ? Sweating and feeling clammy. ? Confusion. ? Dizziness or feeling light-headed. ? Increased heart rate or palpitations. ? Blurry vision. ? Tingling or numbness around the mouth, lips, or tongue. ? Tremors or shakes. ? Irritability.  Keep a rapid-acting carbohydrate snack available before, during, and after exercise to help prevent or treat hypoglycemia.  Avoid injecting insulin into areas of the body that are going to be exercised. For example, avoid injecting insulin into: ? The arms, when playing tennis. ? The legs, when jogging.  Keep records of your exercise habits. Doing this can help you and your health care provider adjust your diabetes management plan as needed. Write down: ? Food that you eat before and after you exercise. ? Blood glucose levels before and after you exercise. ? The type and amount of exercise you have done. ? When your insulin is expected to peak, if you use   insulin. Avoid exercising at times when your insulin is peaking.  When you start a new exercise or activity, work with your health care provider to make sure the activity is safe for you, and to adjust your insulin, medicines, or food intake as needed.  Drink plenty of water while  you exercise to prevent dehydration or heat stroke. Drink enough fluid to keep your urine clear or pale yellow. Summary  Exercising regularly is important for your overall health, especially when you have diabetes (diabetes mellitus).  Exercising has many health benefits, such as increasing muscle strength and bone density and reducing body fat and stress.  Your health care provider or certified diabetes educator can help you make a plan for the type and frequency of exercise (activity plan) that works for you.  When you start a new exercise or activity, work with your health care provider to make sure the activity is safe for you, and to adjust your insulin, medicines, or food intake as needed. This information is not intended to replace advice given to you by your health care provider. Make sure you discuss any questions you have with your health care provider. Document Revised: 08/19/2016 Document Reviewed: 07/07/2015 Elsevier Patient Education  2020 [employer]  

## 2019-08-28 LAB — CMP14+EGFR
ALT: 18 IU/L (ref 0–32)
AST: 17 IU/L (ref 0–40)
Albumin/Globulin Ratio: 1.7 (ref 1.2–2.2)
Albumin: 4.2 g/dL (ref 3.8–4.8)
Alkaline Phosphatase: 127 IU/L — ABNORMAL HIGH (ref 48–121)
BUN/Creatinine Ratio: 21 (ref 12–28)
BUN: 18 mg/dL (ref 8–27)
Bilirubin Total: 0.2 mg/dL (ref 0.0–1.2)
CO2: 24 mmol/L (ref 20–29)
Calcium: 9.9 mg/dL (ref 8.7–10.3)
Chloride: 106 mmol/L (ref 96–106)
Creatinine, Ser: 0.84 mg/dL (ref 0.57–1.00)
GFR calc Af Amer: 86 mL/min/{1.73_m2} (ref >59)
GFR calc non Af Amer: 75 mL/min/{1.73_m2} (ref >59)
Globulin, Total: 2.5 g/dL (ref 1.5–4.5)
Glucose: 143 mg/dL — ABNORMAL HIGH (ref 65–99)
Potassium: 4.1 mmol/L (ref 3.5–5.2)
Sodium: 144 mmol/L (ref 134–144)
Total Protein: 6.7 g/dL (ref 6.0–8.5)

## 2019-08-28 LAB — VITAMIN B12: Vitamin B-12: 626 pg/mL (ref 232–1245)

## 2019-08-28 LAB — HEMOGLOBIN A1C
Est. average glucose Bld gHb Est-mCnc: 177 mg/dL
Hgb A1c MFr Bld: 7.8 % — ABNORMAL HIGH (ref 4.8–5.6)

## 2019-10-03 ENCOUNTER — Other Ambulatory Visit: Payer: Self-pay | Admitting: Internal Medicine

## 2019-10-17 LAB — HM DIABETES EYE EXAM

## 2019-10-19 ENCOUNTER — Encounter: Payer: Self-pay | Admitting: Internal Medicine

## 2019-11-19 ENCOUNTER — Other Ambulatory Visit: Payer: Self-pay | Admitting: Internal Medicine

## 2019-11-24 ENCOUNTER — Ambulatory Visit: Payer: BC Managed Care – PPO | Attending: Internal Medicine

## 2019-11-24 DIAGNOSIS — Z23 Encounter for immunization: Secondary | ICD-10-CM

## 2019-11-24 LAB — HM MAMMOGRAPHY

## 2019-11-24 NOTE — Progress Notes (Signed)
   Covid-19 Vaccination Clinic  Name:  Ashley Monroe    MRN: [id number] DOB: 06/03/1955  11/24/2019  [name] was observed post Covid-19 immunization for 15 minutes without incident. She was provided with Vaccine Information Sheet and instruction to access the V-Safe system.   [name] was instructed to call 911 with any severe reactions post vaccine: Marland Kitchen Difficulty breathing  . Swelling of face and throat  . A fast heartbeat  . A bad rash all over body  . Dizziness and weakness

## 2019-12-06 ENCOUNTER — Encounter: Payer: Self-pay | Admitting: Internal Medicine

## 2019-12-23 ENCOUNTER — Other Ambulatory Visit: Payer: Self-pay | Admitting: Internal Medicine

## 2019-12-23 DIAGNOSIS — N182 Chronic kidney disease, stage 2 (mild): Secondary | ICD-10-CM

## 2019-12-23 DIAGNOSIS — E1122 Type 2 diabetes mellitus with diabetic chronic kidney disease: Secondary | ICD-10-CM

## 2020-01-10 ENCOUNTER — Other Ambulatory Visit: Payer: Self-pay

## 2020-01-10 ENCOUNTER — Encounter: Payer: Self-pay | Admitting: Internal Medicine

## 2020-01-10 ENCOUNTER — Ambulatory Visit (INDEPENDENT_AMBULATORY_CARE_PROVIDER_SITE_OTHER): Payer: BC Managed Care – PPO | Admitting: Internal Medicine

## 2020-01-10 VITALS — BP 134/86 | HR 87 | Temp 98.0°F | Ht 64.8 in | Wt 224.6 lb

## 2020-01-10 DIAGNOSIS — I129 Hypertensive chronic kidney disease with stage 1 through stage 4 chronic kidney disease, or unspecified chronic kidney disease: Secondary | ICD-10-CM | POA: Diagnosis not present

## 2020-01-10 DIAGNOSIS — N182 Chronic kidney disease, stage 2 (mild): Secondary | ICD-10-CM | POA: Diagnosis not present

## 2020-01-10 DIAGNOSIS — E1122 Type 2 diabetes mellitus with diabetic chronic kidney disease: Secondary | ICD-10-CM

## 2020-01-10 DIAGNOSIS — Z0000 Encounter for general adult medical examination without abnormal findings: Secondary | ICD-10-CM

## 2020-01-10 DIAGNOSIS — Z6836 Body mass index (BMI) 36.0-36.9, adult: Secondary | ICD-10-CM

## 2020-01-10 DIAGNOSIS — M84375D Stress fracture, left foot, subsequent encounter for fracture with routine healing: Secondary | ICD-10-CM

## 2020-01-10 LAB — POCT URINALYSIS DIPSTICK
Bilirubin, UA: NEGATIVE
Blood, UA: NEGATIVE
Glucose, UA: NEGATIVE
Ketones, UA: NEGATIVE
Leukocytes, UA: NEGATIVE
Nitrite, UA: NEGATIVE
Protein, UA: NEGATIVE
Spec Grav, UA: >=1.03 — AB (ref 1.010–1.025)
Urobilinogen, UA: 0.2 E.U./dL
pH, UA: 5.5 (ref 5.0–8.0)

## 2020-01-10 LAB — POCT UA - MICROALBUMIN
Albumin/Creatinine Ratio, Urine, POC: 30
Creatinine, POC: 300 mg/dL
Microalbumin Ur, POC: 30 mg/L

## 2020-01-10 MED ORDER — METFORMIN HCL 500 MG PO TABS: 500.0000 mg | ORAL_TABLET | Freq: Two times a day (BID) | ORAL | 2 refills | Status: DC

## 2020-01-10 MED ORDER — ACCU-CHEK GUIDE VI STRP
ORAL_STRIP | 2 refills | Status: DC
Start: 2020-01-10 — End: 2021-01-13

## 2020-01-10 MED ORDER — LEVEMIR FLEXTOUCH 100 UNIT/ML ~~LOC~~ SOPN
10.0000 [IU] | PEN_INJECTOR | Freq: Every day | SUBCUTANEOUS | 2 refills | Status: DC
Start: 2020-01-10 — End: 2020-12-19

## 2020-01-10 MED ORDER — PEN NEEDLES 32G X 4 MM MISC: 2 refills | Status: AC

## 2020-01-10 MED ORDER — ACCU-CHEK FASTCLIX LANCETS MISC: 2 refills | Status: DC

## 2020-01-10 NOTE — Progress Notes (Signed)
I,[name],acting as a Education administrator for Maximino Greenland, MD.,have documented all relevant documentation on the behalf of Maximino Greenland, MD,as directed by  Maximino Greenland, MD while in the presence of Maximino Greenland, MD.  This visit occurred during the SARS-CoV-2 public health emergency.  Safety protocols were in place, including screening questions prior to the visit, additional usage of staff PPE, and extensive cleaning of exam room while observing appropriate contact time as indicated for disinfecting solutions.  Subjective:     Patient ID: Ashley Monroe , female    DOB: [birth year] , 64 y.o.   MRN: [id number]   Chief Complaint  Patient presents with  . Annual Exam  . Diabetes  . Hypertension    HPI  She is here today for a full physical examination.  She is followed by Dr. Garwin Brothers for her Gyn care. She had a pelvic exam on 11/26/2019 and mammogram on 11/20/2019. She is  s/p hysterectomy.   Diabetes She presents for her follow-up diabetic visit. She has type 2 diabetes mellitus. Her disease course has been stable. There are no hypoglycemic associated symptoms. Pertinent negatives for diabetes include no blurred vision and no chest pain. There are no hypoglycemic complications. Risk factors for coronary artery disease include diabetes mellitus, dyslipidemia, hypertension, obesity and sedentary lifestyle. She is following a generally healthy diet. She never participates in exercise. Her breakfast blood glucose is taken between 7-8 am. Her breakfast blood glucose range is generally 130-140 mg/dl. An ACE inhibitor/angiotensin II receptor blocker is being taken. Eye exam is current.  Hypertension This is a chronic problem. The current episode started more than 1 year ago. The problem has been gradually improving since onset. The problem is controlled. Pertinent negatives include no blurred vision, chest pain, palpitations or shortness of breath. Risk factors for coronary artery disease  include obesity, post-menopausal state, sedentary lifestyle, dyslipidemia and diabetes mellitus. Past treatments include angiotensin blockers.     Past Medical History:  Diagnosis Date  . Allergy   . Arthritis    RA  . Carpal tunnel syndrome   . Chronic kidney disease   . Diabetes mellitus without complication ([place])   . Essential hypertension 06/01/2019  . Hyperlipidemia 08/19/2016  . Hypertension   . PONV (postoperative nausea and vomiting)      Family History  Problem Relation Age of Onset  . Atrial fibrillation Mother   . Stroke Mother   . Heart attack Mother   . Aneurysm Mother   . Atrial fibrillation Brother   . Stroke Brother   . Diabetes Brother   . Diabetes Father   . Alzheimer's disease Father   . Diabetes Brother   . Alzheimer's disease Maternal Grandmother   . Heart attack Maternal Grandfather   . Cancer Paternal Grandmother   . Healthy Son   . Healthy Daughter      Current Outpatient Medications:  .  amLODipine (NORVASC) 10 MG tablet, TAKE 1 TABLET BY MOUTH EVERY DAY, Disp: 90 tablet, Rfl: 1 .  aspirin EC 81 MG tablet, Take 81 mg by mouth daily., Disp: , Rfl:  .  cetirizine (ZYRTEC) 10 MG tablet, Take 10 mg by mouth daily., Disp: , Rfl:  .  cyclobenzaprine (FLEXERIL) 10 MG tablet, Take 10 mg by mouth at bedtime as needed for muscle spasms., Disp: , Rfl: 0 .  gabapentin (NEURONTIN) 300 MG capsule, Take 300 mg by mouth 3 (three) times daily as needed (pain). , Disp: , Rfl:  .  glucose blood (ACCU-CHEK GUIDE) test strip, Use as instructed to check blood sugars 2 times per day dx: E11.22, Disp: 300 each, Rfl: 2 .  Insulin Pen Needle (PEN NEEDLES) 32G X 4 MM MISC, Use as directed with Levemir, Disp: 150 each, Rfl: 2 .  olmesartan-hydrochlorothiazide (BENICAR HCT) 40-25 MG tablet, TAKE 1 TABLET BY MOUTH EVERY DAY, Disp: 90 tablet, Rfl: 1 .  oxyCODONE (OXY IR/ROXICODONE) 5 MG immediate release tablet, Take 1 tablet (5 mg total) by mouth every 4 (four) hours as needed  for moderate pain ((score 4 to 6))., Disp: 40 tablet, Rfl: 0 .  OZEMPIC, 0.25 OR 0.5 MG/DOSE, 2 MG/1.5ML SOPN, INJECT 0.5 MG INTO THE SKIN ONCE A WEEK., Disp: 4.5 mL, Rfl: 3 .  rosuvastatin (CRESTOR) 20 MG tablet, Take 1 tablet (20 mg total) by mouth every Monday, Wednesday, and Friday., Disp: 36 tablet, Rfl: 3 .  Accu-Chek FastClix Lancets MISC, Use as instructed to check blood sugars 2 times per day dx: E11.22, Disp: 300 each, Rfl: 2 .  hydroxychloroquine (PLAQUENIL) 200 MG tablet, TAKE 1 TABLET BY MOUTH TWICE A DAY (Patient not taking: Reported on 01/10/2020), Disp: 30 tablet, Rfl: 0 .  insulin detemir (LEVEMIR FLEXTOUCH) 100 UNIT/ML FlexPen, Inject 10 Units into the skin at bedtime., Disp: 15 mL, Rfl: 2 .  metFORMIN (GLUCOPHAGE) 500 MG tablet, Take 1 tablet (500 mg total) by mouth 2 (two) times daily with a meal., Disp: 180 tablet, Rfl: 2   Allergies  Allergen Reactions  . Amoxicillin Hives  . Codeine Hives      The patient states she uses post menopausal status for birth control. Last LMP was No LMP recorded. Patient has had a hysterectomy.. Negative for Dysmenorrhea. Negative for: breast discharge, breast lump(s), breast pain and breast self exam. Associated symptoms include abnormal vaginal bleeding. Pertinent negatives include abnormal bleeding (hematology), anxiety, decreased libido, depression, difficulty falling sleep, dyspareunia, history of infertility, nocturia, sexual dysfunction, sleep disturbances, urinary incontinence, urinary urgency, vaginal discharge and vaginal itching. Diet regular.The patient states her exercise level is  intermittent.   . The patient's tobacco use is:  Social History   Tobacco Use  Smoking Status Never Smoker  Smokeless Tobacco Never Used  . She has been exposed to passive smoke. The patient's alcohol use is:  Social History   Substance and Sexual Activity  Alcohol Use Never  . Alcohol/week: 0.0 standard drinks    Review of Systems   Constitutional: Negative.   HENT: Negative.   Eyes: Negative.  Negative for blurred vision.  Respiratory: Negative.  Negative for shortness of breath.   Cardiovascular: Negative.  Negative for chest pain and palpitations.  Gastrointestinal: Negative.   Endocrine: Negative.   Genitourinary: Negative.   Musculoskeletal: Negative.   Skin: Negative.   Allergic/Immunologic: Negative.   Neurological: Negative.   Hematological: Negative.   Psychiatric/Behavioral: Negative.      Today's Vitals   01/10/20 1023  BP: 134/86  Pulse: 87  Temp: 98 F (36.7 C)  TempSrc: Oral  Weight: 224 lb 9.6 oz (101.9 kg)  Height: 5' 4.8" (1.646 m)   Body mass index is 37.61 kg/m.  Wt Readings from Last 3 Encounters:  01/10/20 224 lb 9.6 oz (101.9 kg)  08/27/19 224 lb (101.6 kg)  07/30/19 226 lb 6.6 oz (102.7 kg)   Objective:  Physical Exam Vitals and nursing note reviewed.  Constitutional:      General: She is not in acute distress.    Appearance: Normal appearance. She is  well-developed. She is obese.  HENT:     Head: Normocephalic and atraumatic.     Right Ear: Hearing, tympanic membrane, ear canal and external ear normal. There is no impacted cerumen.     Left Ear: Hearing, tympanic membrane, ear canal and external ear normal. There is no impacted cerumen.     Nose:     Comments: Deferred, masked    Mouth/Throat:     Comments: Deferred, masked Eyes:     General: Lids are normal.     Extraocular Movements: Extraocular movements intact.     Conjunctiva/sclera: Conjunctivae normal.     Pupils: Pupils are equal, round, and reactive to light.     Funduscopic exam:    Right eye: No papilledema.        Left eye: No papilledema.  Neck:     Thyroid: No thyroid mass.     Vascular: No carotid bruit.  Cardiovascular:     Rate and Rhythm: Normal rate and regular rhythm.     Pulses: Normal pulses.          Dorsalis pedis pulses are 2+ on the right side and 2+ on the left side.     Heart  sounds: Normal heart sounds. No murmur heard.   Pulmonary:     Effort: Pulmonary effort is normal.     Breath sounds: Normal breath sounds.  Chest:  Breasts:     Tanner Score is 5.     Right: Normal.     Left: Normal.    Abdominal:     General: Bowel sounds are normal. There is no distension.     Palpations: Abdomen is soft.     Tenderness: There is no abdominal tenderness.  Genitourinary:    Comments: Deferred Musculoskeletal:        General: No swelling. Normal range of motion.     Cervical back: Full passive range of motion without pain, normal range of motion and neck supple.     Right lower leg: No edema.     Left lower leg: No edema.  Feet:     Right foot:     Protective Sensation: 5 sites tested. 5 sites sensed.     Skin integrity: Dry skin present.     Toenail Condition: Right toenails are normal.     Left foot:     Protective Sensation: 5 sites tested. 5 sites sensed.     Skin integrity: Dry skin present.     Toenail Condition: Left toenails are normal.  Skin:    General: Skin is warm and dry.     Capillary Refill: Capillary refill takes less than 2 seconds.  Neurological:     General: No focal deficit present.     Mental Status: She is alert and oriented to person, place, and time.     Cranial Nerves: No cranial nerve deficit.     Sensory: No sensory deficit.  Psychiatric:        Mood and Affect: Mood normal.        Behavior: Behavior normal.        Thought Content: Thought content normal.        Judgment: Judgment normal.         Assessment And Plan:     1. Routine general medical examination at health care facility Comments: A full exam was performed. Importance of monthly self breast exams was discussed with the patient. PATIENT IS ADVISED TO GET 30-45 MINUTES REGULAR EXERCISE NO LESS THAN FOUR  TO FIVE DAYS PER WEEK - BOTH WEIGHTBEARING EXERCISES AND AEROBIC ARE RECOMMENDED.  PATIENT IS ADVISED TO FOLLOW A HEALTHY DIET WITH AT LEAST SIX FRUITS/VEGGIES  PER DAY, DECREASE INTAKE OF RED MEAT, AND TO INCREASE FISH INTAKE TO TWO DAYS PER WEEK.  MEATS/FISH SHOULD NOT BE FRIED, BAKED OR BROILED IS PREFERABLE.  I SUGGEST WEARING SPF 50 SUNSCREEN ON EXPOSED PARTS AND ESPECIALLY WHEN IN THE DIRECT SUNLIGHT FOR AN EXTENDED PERIOD OF TIME.  PLEASE AVOID FAST FOOD RESTAURANTS AND INCREASE YOUR WATER INTAKE.  - Hemoglobin A1c - Lipid panel - Uric acid - BMP8+EGFR  2. Type 2 diabetes mellitus with stage 2 chronic kidney disease, without long-term current use of insulin (HCC) Comments: Diabetic foot exam was performed. She will f/u in 3-4 months for re-evaluation. I DISCUSSED WITH THE PATIENT AT LENGTH REGARDING THE GOALS OF GLYCEMIC CONTROL AND POSSIBLE LONG-TERM COMPLICATIONS.  I  ALSO STRESSED THE IMPORTANCE OF COMPLIANCE WITH HOME GLUCOSE MONITORING, DIETARY RESTRICTIONS INCLUDING AVOIDANCE OF SUGARY DRINKS/PROCESSED FOODS,  ALONG WITH REGULAR EXERCISE.  I  ALSO STRESSED THE IMPORTANCE OF ANNUAL EYE EXAMS, SELF FOOT CARE AND COMPLIANCE WITH OFFICE VISITS. - POCT Urinalysis Dipstick (81002) - POCT UA - Microalbumin  3. Hypertensive nephropathy Comments: Chronic, fair control. She will continue with current meds for now. She is aware that optimal BP is less than 130/80. EKG performed, NSR w/o acute changes. Encouraged to avoid adding salt to her foods.   4. Stress fracture of left foot with routine healing, subsequent encounter Comments: She is followed by Podiatry. Advised to wear boot as needed per Podiatry.   5. Class 2 severe obesity due to excess calories with serious comorbidity and body mass index (BMI) of 36.0 to 36.9 in adult [facility]) She is encouraged to strive for BMI less than 30 to decrease cardiac risk. Advised to aim for at least 150 minutes of exercise per week.    Patient was given opportunity to ask questions. Patient verbalized understanding of the plan and was able to repeat key elements of the plan. All questions were answered to their  satisfaction.   Maximino Greenland, MD   I, Maximino Greenland, MD, have reviewed all documentation for this visit. The documentation on 01/20/20 for the exam, diagnosis, procedures, and orders are all accurate and complete.  THE PATIENT IS ENCOURAGED TO PRACTICE SOCIAL DISTANCING DUE TO THE COVID-19 PANDEMIC.

## 2020-01-10 NOTE — Patient Instructions (Signed)
Health Maintenance, Female Adopting a healthy lifestyle and getting preventive care are important in promoting health and wellness. Ask your health care provider about:  The right schedule for you to have regular tests and exams.  Things you can do on your own to prevent diseases and keep yourself healthy. What should I know about diet, weight, and exercise? Eat a healthy diet   Eat a diet that includes plenty of vegetables, fruits, low-fat dairy products, and lean protein.  Do not eat a lot of foods that are high in solid fats, added sugars, or sodium. Maintain a healthy weight Body mass index (BMI) is used to identify weight problems. It estimates body fat based on height and weight. Your health care provider can help determine your BMI and help you achieve or maintain a healthy weight. Get regular exercise Get regular exercise. This is one of the most important things you can do for your health. Most adults should:  Exercise for at least 150 minutes each week. The exercise should increase your heart rate and make you sweat (moderate-intensity exercise).  Do strengthening exercises at least twice a week. This is in addition to the moderate-intensity exercise.  Spend less time sitting. Even light physical activity can be beneficial. Watch cholesterol and blood lipids Have your blood tested for lipids and cholesterol at 63 years of age, then have this test every 5 years. Have your cholesterol levels checked more often if:  Your lipid or cholesterol levels are high.  You are older than 63 years of age.  You are at high risk for heart disease. What should I know about cancer screening? Depending on your health history and family history, you may need to have cancer screening at various ages. This may include screening for:  Breast cancer.  Cervical cancer.  Colorectal cancer.  Skin cancer.  Lung cancer. What should I know about heart disease, diabetes, and high blood  pressure? Blood pressure and heart disease  High blood pressure causes heart disease and increases the risk of stroke. This is more likely to develop in people who have high blood pressure readings, are of African descent, or are overweight.  Have your blood pressure checked: ? Every 3-5 years if you are 18-39 years of age. ? Every year if you are 40 years old or older. Diabetes Have regular diabetes screenings. This checks your fasting blood sugar level. Have the screening done:  Once every three years after age 40 if you are at a normal weight and have a low risk for diabetes.  More often and at a younger age if you are overweight or have a high risk for diabetes. What should I know about preventing infection? Hepatitis B If you have a higher risk for hepatitis B, you should be screened for this virus. Talk with your health care provider to find out if you are at risk for hepatitis B infection. Hepatitis C Testing is recommended for:  Everyone born from 1945 through 1965.  Anyone with known risk factors for hepatitis C. Sexually transmitted infections (STIs)  Get screened for STIs, including gonorrhea and chlamydia, if: ? You are sexually active and are younger than 63 years of age. ? You are older than 63 years of age and your health care provider tells you that you are at risk for this type of infection. ? Your sexual activity has changed since you were last screened, and you are at increased risk for chlamydia or gonorrhea. Ask your health care provider if   you are at risk.  Ask your health care provider about whether you are at high risk for HIV. Your health care provider may recommend a prescription medicine to help prevent HIV infection. If you choose to take medicine to prevent HIV, you should first get tested for HIV. You should then be tested every 3 months for as long as you are taking the medicine. Pregnancy  If you are about to stop having your period (premenopausal) and  you may become pregnant, seek counseling before you get pregnant.  Take 400 to 800 micrograms (mcg) of folic acid every day if you become pregnant.  Ask for birth control (contraception) if you want to prevent pregnancy. Osteoporosis and menopause Osteoporosis is a disease in which the bones lose minerals and strength with aging. This can result in bone fractures. If you are 65 years old or older, or if you are at risk for osteoporosis and fractures, ask your health care provider if you should:  Be screened for bone loss.  Take a calcium or vitamin D supplement to lower your risk of fractures.  Be given hormone replacement therapy (HRT) to treat symptoms of menopause. Follow these instructions at home: Lifestyle  Do not use any products that contain nicotine or tobacco, such as cigarettes, e-cigarettes, and chewing tobacco. If you need help quitting, ask your health care provider.  Do not use street drugs.  Do not share needles.  Ask your health care provider for help if you need support or information about quitting drugs. Alcohol use  Do not drink alcohol if: ? Your health care provider tells you not to drink. ? You are pregnant, may be pregnant, or are planning to become pregnant.  If you drink alcohol: ? Limit how much you use to 0-1 drink a day. ? Limit intake if you are breastfeeding.  Be aware of how much alcohol is in your drink. In the [place] one drink equals one 12 oz bottle of beer (355 mL), one 5 oz glass of wine (148 mL), or one 1 oz glass of hard liquor (44 mL). General instructions  Schedule regular health, dental, and eye exams.  Stay current with your vaccines.  Tell your health care provider if: ? You often feel depressed. ? You have ever been abused or do not feel safe at home. Summary  Adopting a healthy lifestyle and getting preventive care are important in promoting health and wellness.  Follow your health care provider's instructions about healthy  diet, exercising, and getting tested or screened for diseases.  Follow your health care provider's instructions on monitoring your cholesterol and blood pressure. This information is not intended to replace advice given to you by your health care provider. Make sure you discuss any questions you have with your health care provider. Document Revised: 01/18/2018 Document Reviewed: 01/18/2018 Elsevier Patient Education  2020 [employer]  

## 2020-01-11 LAB — BMP8+EGFR
BUN/Creatinine Ratio: 16 (ref 12–28)
BUN: 15 mg/dL (ref 8–27)
CO2: 23 mmol/L (ref 20–29)
Calcium: 10.4 mg/dL — ABNORMAL HIGH (ref 8.7–10.3)
Chloride: 103 mmol/L (ref 96–106)
Creatinine, Ser: 0.92 mg/dL (ref 0.57–1.00)
GFR calc Af Amer: 77 mL/min/{1.73_m2} (ref >59)
GFR calc non Af Amer: 67 mL/min/{1.73_m2} (ref >59)
Glucose: 136 mg/dL — ABNORMAL HIGH (ref 65–99)
Potassium: 4.2 mmol/L (ref 3.5–5.2)
Sodium: 141 mmol/L (ref 134–144)

## 2020-01-11 LAB — LIPID PANEL
Chol/HDL Ratio: 4.8 ratio — ABNORMAL HIGH (ref 0.0–4.4)
Cholesterol, Total: 186 mg/dL (ref 100–199)
HDL: 39 mg/dL — ABNORMAL LOW (ref >39)
LDL Chol Calc (NIH): 117 mg/dL — ABNORMAL HIGH (ref 0–99)
Triglycerides: 170 mg/dL — ABNORMAL HIGH (ref 0–149)
VLDL Cholesterol Cal: 30 mg/dL (ref 5–40)

## 2020-01-11 LAB — HEMOGLOBIN A1C
Est. average glucose Bld gHb Est-mCnc: 203 mg/dL
Hgb A1c MFr Bld: 8.7 % — ABNORMAL HIGH (ref 4.8–5.6)

## 2020-01-11 LAB — URIC ACID: Uric Acid: 7.6 mg/dL — ABNORMAL HIGH (ref 3.0–7.2)

## 2020-01-29 ENCOUNTER — Telehealth: Payer: Self-pay

## 2020-01-29 NOTE — Telephone Encounter (Signed)
I can add another medication to Ozempic, Farxiga 10mg  daily. If we do not do something soon, we will need insulin.

## 2020-01-29 NOTE — Telephone Encounter (Signed)
The pt was notified that her flma form has been completed and faxed to her HR.  The pt was told that I will email her completed form to her email on file.  [email or web address]   The pt was told to let Dr Allyne Gee know that she doesn't want to increase the ozempic to help her blood sugars she would like to try another medication.

## 2020-01-30 ENCOUNTER — Telehealth: Payer: Self-pay

## 2020-01-30 NOTE — Telephone Encounter (Signed)
The pt said that she is already taking 10 units of levemir daily.

## 2020-01-30 NOTE — Telephone Encounter (Signed)
I meant to say more insulin. My goal is to get her off of insulin. Therefore, she should avoid drinking sugary beverages, including diet. She should incorporate more exercise into her daily routine. We can add other oral meds as well.

## 2020-01-30 NOTE — Telephone Encounter (Signed)
The patient was notified that  Dr Allyne Gee wants the pt to know that she can add another medication to go along with the Ozempic, Farxiga 10 mg daily can be added, if nothing is done now that the pt would need insulin.  The pt said that she is already taking 10 mg of levemir daily.

## 2020-01-31 ENCOUNTER — Telehealth: Payer: Self-pay

## 2020-01-31 NOTE — Telephone Encounter (Signed)
farxiga 10.tell her to stay well hydrated. continue with ozempic. may get yeast infection. should be fine after two weeks. key is to avoid eating too much sugar  Patient notified

## 2020-01-31 NOTE — Telephone Encounter (Signed)
The pt said yes to starting another oral medication.  Did you still want her to add Farxiga 10 mg daily or was it going to be a different medication?

## 2020-03-26 ENCOUNTER — Other Ambulatory Visit: Payer: Self-pay

## 2020-03-26 ENCOUNTER — Encounter: Payer: Self-pay | Admitting: Internal Medicine

## 2020-03-26 ENCOUNTER — Ambulatory Visit (INDEPENDENT_AMBULATORY_CARE_PROVIDER_SITE_OTHER): Payer: BC Managed Care – PPO | Admitting: Internal Medicine

## 2020-03-26 VITALS — BP 122/70 | HR 83 | Temp 97.6°F | Ht 64.6 in | Wt 224.6 lb

## 2020-03-26 DIAGNOSIS — Z6837 Body mass index (BMI) 37.0-37.9, adult: Secondary | ICD-10-CM

## 2020-03-26 DIAGNOSIS — N182 Chronic kidney disease, stage 2 (mild): Secondary | ICD-10-CM

## 2020-03-26 DIAGNOSIS — E1122 Type 2 diabetes mellitus with diabetic chronic kidney disease: Secondary | ICD-10-CM

## 2020-03-26 DIAGNOSIS — I129 Hypertensive chronic kidney disease with stage 1 through stage 4 chronic kidney disease, or unspecified chronic kidney disease: Secondary | ICD-10-CM

## 2020-03-26 MED ORDER — ACCU-CHEK FASTCLIX LANCETS MISC: 2 refills | Status: AC

## 2020-03-26 NOTE — Progress Notes (Signed)
I,Tianna Badgett,acting as a Education administrator for Maximino Greenland, MD.,have documented all relevant documentation on the behalf of Maximino Greenland, MD,as directed by  Maximino Greenland, MD while in the presence of Maximino Greenland, MD.  This visit occurred during the SARS-CoV-2 public health emergency.  Safety protocols were in place, including screening questions prior to the visit, additional usage of staff PPE, and extensive cleaning of exam room while observing appropriate contact time as indicated for disinfecting solutions.  Subjective:     Patient ID: Ashley Monroe , female    DOB: 1955/12/16 , 65 y.o.   MRN: [id number]   Chief Complaint  Patient presents with  . Diabetes  . Hypertension    HPI  Patient is here for diabetes check. She started farxiga $RemoveBeforeD'10mg'lVHvzWtVVHhVrr$  at her 01/10/20 visit. She stated that she stopped this medication at the end of January due to headaches and dizziness. She has no other concerns at this time.   Diabetes She presents for her follow-up diabetic visit. She has type 2 diabetes mellitus. Her disease course has been stable. There are no hypoglycemic associated symptoms. There are no hypoglycemic complications. Risk factors for coronary artery disease include diabetes mellitus, dyslipidemia, hypertension, obesity and sedentary lifestyle. She is following a generally healthy diet. Her breakfast blood glucose is taken between 7-8 am. Her breakfast blood glucose range is generally 130-140 mg/dl.     Past Medical History:  Diagnosis Date  . Allergy   . Arthritis    RA  . Carpal tunnel syndrome   . Chronic kidney disease   . Diabetes mellitus without complication ([place])   . Essential hypertension 06/01/2019  . Hyperlipidemia 08/19/2016  . Hypertension   . PONV (postoperative nausea and vomiting)      Family History  Problem Relation Age of Onset  . Atrial fibrillation Mother   . Stroke Mother   . Heart attack Mother   . Aneurysm Mother   . Atrial fibrillation Brother    . Stroke Brother   . Diabetes Brother   . Diabetes Father   . Alzheimer's disease Father   . Diabetes Brother   . Alzheimer's disease Maternal Grandmother   . Heart attack Maternal Grandfather   . Cancer Paternal Grandmother   . Healthy Son   . Healthy Daughter      Current Outpatient Medications:  .  amLODipine (NORVASC) 10 MG tablet, TAKE 1 TABLET BY MOUTH EVERY DAY, Disp: 90 tablet, Rfl: 1 .  aspirin EC 81 MG tablet, Take 81 mg by mouth daily., Disp: , Rfl:  .  cetirizine (ZYRTEC) 10 MG tablet, Take 10 mg by mouth daily., Disp: , Rfl:  .  cyclobenzaprine (FLEXERIL) 10 MG tablet, Take 10 mg by mouth at bedtime as needed for muscle spasms., Disp: , Rfl: 0 .  gabapentin (NEURONTIN) 300 MG capsule, Take 300 mg by mouth 3 (three) times daily as needed (pain). , Disp: , Rfl:  .  glucose blood (ACCU-CHEK GUIDE) test strip, Use as instructed to check blood sugars 2 times per day dx: E11.22, Disp: 300 each, Rfl: 2 .  insulin detemir (LEVEMIR FLEXTOUCH) 100 UNIT/ML FlexPen, Inject 10 Units into the skin at bedtime., Disp: 15 mL, Rfl: 2 .  Insulin Pen Needle (PEN NEEDLES) 32G X 4 MM MISC, Use as directed with Levemir, Disp: 150 each, Rfl: 2 .  metFORMIN (GLUCOPHAGE) 500 MG tablet, Take 1 tablet (500 mg total) by mouth 2 (two) times daily with a meal., Disp: 180 tablet,  Rfl: 2 .  oxyCODONE (OXY IR/ROXICODONE) 5 MG immediate release tablet, Take 1 tablet (5 mg total) by mouth every 4 (four) hours as needed for moderate pain ((score 4 to 6))., Disp: 40 tablet, Rfl: 0 .  OZEMPIC, 0.25 OR 0.5 MG/DOSE, 2 MG/1.5ML SOPN, INJECT 0.5 MG INTO THE SKIN ONCE A WEEK., Disp: 4.5 mL, Rfl: 3 .  rosuvastatin (CRESTOR) 20 MG tablet, Take 1 tablet (20 mg total) by mouth every Monday, Wednesday, and Friday., Disp: 36 tablet, Rfl: 3 .  Accu-Chek FastClix Lancets MISC, Use as instructed to check blood sugars 2 times per day dx: E11.22, Disp: 300 each, Rfl: 2 .  dapagliflozin propanediol (FARXIGA) 10 MG TABS tablet,  Take by mouth daily. (Patient not taking: Reported on 03/26/2020), Disp: , Rfl:  .  olmesartan-hydrochlorothiazide (BENICAR HCT) 40-25 MG tablet, TAKE 1 TABLET BY MOUTH EVERY DAY, Disp: 90 tablet, Rfl: 1   Allergies  Allergen Reactions  . Amoxicillin Hives  . Codeine Hives     Review of Systems  Constitutional: Negative.   Respiratory: Negative.   Cardiovascular: Negative.   Gastrointestinal: Negative.   Neurological: Negative.      Today's Vitals   03/26/20 1609  BP: 122/70  Pulse: 83  Temp: 97.6 F (36.4 C)  TempSrc: Oral  Weight: 224 lb 9.6 oz (101.9 kg)  Height: 5' 4.6" (1.641 m)   Body mass index is 37.84 kg/m.  Wt Readings from Last 3 Encounters:  03/26/20 224 lb 9.6 oz (101.9 kg)  01/10/20 224 lb 9.6 oz (101.9 kg)  08/27/19 224 lb (101.6 kg)    Objective:  Physical Exam Vitals and nursing note reviewed.  Constitutional:      Appearance: Normal appearance. She is obese.  HENT:     Head: Normocephalic and atraumatic.     Nose:     Comments: Masked     Mouth/Throat:     Comments: Masked  Cardiovascular:     Rate and Rhythm: Normal rate and regular rhythm.     Heart sounds: Normal heart sounds.  Pulmonary:     Effort: Pulmonary effort is normal.     Breath sounds: Normal breath sounds.  Musculoskeletal:     Cervical back: Normal range of motion.  Skin:    General: Skin is warm.  Neurological:     General: No focal deficit present.     Mental Status: She is alert.  Psychiatric:        Mood and Affect: Mood normal.        Behavior: Behavior normal.         Assessment And Plan:     1. Type 2 diabetes mellitus with stage 2 chronic kidney disease, without long-term current use of insulin (HCC) Comments: Chronic. I will check BMP today. She will rto in 2 months for repeat a1c. Importance of dietary compliance was d/w patient.  - BMP8+EGFR  2. Hypertensive nephropathy Comments: Chronic, well controlled. She is encouraged to follow low sodium diet.    3. Class 2 severe obesity due to excess calories with serious comorbidity and body mass index (BMI) of 37.0 to 37.9 in adult [facility]) She is encouraged to strive for BMI less than 30 to decrease cardiac risk. Advised to aim for at least 150 minutes of exercise per week.   Patient was given opportunity to ask questions. Patient verbalized understanding of the plan and was able to repeat key elements of the plan. All questions were answered to their satisfaction.   I,  Maximino Greenland, MD, have reviewed all documentation for this visit. The documentation on 04/06/20 for the exam, diagnosis, procedures, and orders are all accurate and complete.  THE PATIENT IS ENCOURAGED TO PRACTICE SOCIAL DISTANCING DUE TO THE COVID-19 PANDEMIC.

## 2020-03-26 NOTE — Patient Instructions (Signed)
Diabetes Mellitus and Exercise Exercising regularly is important for overall health, especially for people who have diabetes mellitus. Exercising is not only about losing weight. It has many other health benefits, such as increasing muscle strength and bone density and reducing body fat and stress. This leads to improved fitness, flexibility, and endurance, all of which result in better overall health. What are the benefits of exercise if I have diabetes? Exercise has many benefits for people with diabetes. They include:  Helping to lower and control blood sugar (glucose).  Helping the body to respond better to the hormone insulin by improving insulin sensitivity.  Reducing how much insulin the body needs.  Lowering the risk for heart disease by: ? Lowering "bad" cholesterol and triglyceride levels. ? Increasing "good" cholesterol levels. ? Lowering blood pressure. ? Lowering blood glucose levels. What is my activity plan? Your health care provider or certified diabetes educator can help you make a plan for the type and frequency of exercise that works for you. This is called your activity plan. Be sure to:  Get at least 150 minutes of medium-intensity or high-intensity exercise each week. Exercises may include brisk walking, biking, or water aerobics.  Do stretching and strengthening exercises, such as yoga or weight lifting, at least 2 times a week.  Spread out your activity over at least 3 days of the week.  Get some form of physical activity each day. ? Do not go more than 2 days in a row without some kind of physical activity. ? Avoid being inactive for more than 90 minutes at a time. Take frequent breaks to walk or stretch.  Choose exercises or activities that you enjoy. Set realistic goals.  Start slowly and gradually increase your exercise intensity over time.   How do I manage my diabetes during exercise? Monitor your blood glucose  Check your blood glucose before and  after exercising. If your blood glucose is: ? 240 mg/dL (13.3 mmol/L) or higher before you exercise, check your urine for ketones. These are chemicals created by the liver. If you have ketones in your urine, do not exercise until your blood glucose returns to normal. ? 100 mg/dL (5.6 mmol/L) or lower, eat a snack containing 15-20 grams of carbohydrate. Check your blood glucose 15 minutes after the snack to make sure that your glucose level is above 100 mg/dL (5.6 mmol/L) before you start your exercise.  Know the symptoms of low blood glucose (hypoglycemia) and how to treat it. Your risk for hypoglycemia increases during and after exercise. Follow these tips and your health care provider's instructions  Keep a carbohydrate snack that is fast-acting for use before, during, and after exercise to help prevent or treat hypoglycemia.  Avoid injecting insulin into areas of the body that are going to be exercised. For example, avoid injecting insulin into: ? Your arms, when you are about to play tennis. ? Your legs, when you are about to go jogging.  Keep records of your exercise habits. Doing this can help you and your health care provider adjust your diabetes management plan as needed. Write down: ? Food that you eat before and after you exercise. ? Blood glucose levels before and after you exercise. ? The type and amount of exercise you have done.  Work with your health care provider when you start a new exercise or activity. He or she may need to: ? Make sure that the activity is safe for you. ? Adjust your insulin, other medicines, and food that   you eat.  Drink plenty of water while you exercise. This prevents loss of water (dehydration) and problems caused by a lot of heat in the body (heat stroke).   Where to find more information  American Diabetes Association: www.diabetes.org Summary  Exercising regularly is important for overall health, especially for people who have diabetes  mellitus.  Exercising has many health benefits. It increases muscle strength and bone density and reduces body fat and stress. It also lowers and controls blood glucose.  Your health care provider or certified diabetes educator can help you make an activity plan for the type and frequency of exercise that works for you.  Work with your health care provider to make sure any new activity is safe for you. Also work with your health care provider to adjust your insulin, other medicines, and the food you eat. This information is not intended to replace advice given to you by your health care provider. Make sure you discuss any questions you have with your health care provider. Document Revised: 10/23/2018 Document Reviewed: 10/23/2018 Elsevier Patient Education  2021 Elsevier Inc.  

## 2020-03-27 ENCOUNTER — Other Ambulatory Visit: Payer: Self-pay | Admitting: Internal Medicine

## 2020-03-27 LAB — BMP8+EGFR
BUN/Creatinine Ratio: 17 (ref 12–28)
BUN: 16 mg/dL (ref 8–27)
CO2: 22 mmol/L (ref 20–29)
Calcium: 10.2 mg/dL (ref 8.7–10.3)
Chloride: 101 mmol/L (ref 96–106)
Creatinine, Ser: 0.94 mg/dL (ref 0.57–1.00)
GFR calc Af Amer: 75 mL/min/{1.73_m2} (ref >59)
GFR calc non Af Amer: 65 mL/min/{1.73_m2} (ref >59)
Glucose: 99 mg/dL (ref 65–99)
Potassium: 4.3 mmol/L (ref 3.5–5.2)
Sodium: 141 mmol/L (ref 134–144)

## 2020-05-07 ENCOUNTER — Other Ambulatory Visit: Payer: Self-pay | Admitting: Cardiovascular Disease

## 2020-05-07 ENCOUNTER — Other Ambulatory Visit: Payer: Self-pay | Admitting: Internal Medicine

## 2020-05-13 ENCOUNTER — Encounter: Payer: Self-pay | Admitting: Internal Medicine

## 2020-05-13 ENCOUNTER — Ambulatory Visit (INDEPENDENT_AMBULATORY_CARE_PROVIDER_SITE_OTHER): Payer: BC Managed Care – PPO | Admitting: Internal Medicine

## 2020-05-13 ENCOUNTER — Other Ambulatory Visit: Payer: Self-pay

## 2020-05-13 VITALS — BP 126/82 | HR 73 | Temp 98.2°F | Ht 64.6 in | Wt 220.6 lb

## 2020-05-13 DIAGNOSIS — N182 Chronic kidney disease, stage 2 (mild): Secondary | ICD-10-CM

## 2020-05-13 DIAGNOSIS — F4323 Adjustment disorder with mixed anxiety and depressed mood: Secondary | ICD-10-CM | POA: Diagnosis not present

## 2020-05-13 DIAGNOSIS — R61 Generalized hyperhidrosis: Secondary | ICD-10-CM

## 2020-05-13 DIAGNOSIS — E1122 Type 2 diabetes mellitus with diabetic chronic kidney disease: Secondary | ICD-10-CM | POA: Diagnosis not present

## 2020-05-13 DIAGNOSIS — I129 Hypertensive chronic kidney disease with stage 1 through stage 4 chronic kidney disease, or unspecified chronic kidney disease: Secondary | ICD-10-CM | POA: Diagnosis not present

## 2020-05-13 DIAGNOSIS — Z6837 Body mass index (BMI) 37.0-37.9, adult: Secondary | ICD-10-CM

## 2020-05-13 MED ORDER — DULOXETINE HCL 20 MG PO CPEP: 20.0000 mg | ORAL_CAPSULE | Freq: Every day | ORAL | 2 refills | Status: DC

## 2020-05-13 NOTE — Progress Notes (Signed)
This visit occurred during the SARS-CoV-2 public health emergency.  Safety protocols were in place, including screening questions prior to the visit, additional usage of staff PPE, and extensive cleaning of exam room while observing appropriate contact time as indicated for disinfecting solutions.  Subjective:     Patient ID: Ashley Monroe , female    DOB: Jun 18, 1955 , 65 y.o.   MRN: [id number]   Chief Complaint  Patient presents with  . Hypertension  . Diabetes    HPI  The patient is here for a BP/Dm check. She reports compliance with meds. She denies headaches, chest pain and shortness of breath. She is no longer taking [place] because she experienced dizziness.   Hypertension This is a chronic problem. The current episode started more than 1 year ago. The problem has been gradually improving since onset. The problem is controlled. Pertinent negatives include no blurred vision.  Diabetes She presents for her follow-up diabetic visit. She has type 2 diabetes mellitus. Her disease course has been stable. There are no hypoglycemic associated symptoms. Pertinent negatives for diabetes include no blurred vision. There are no hypoglycemic complications. Risk factors for coronary artery disease include diabetes mellitus, dyslipidemia, hypertension, obesity and sedentary lifestyle. She is following a generally healthy diet. She participates in exercise intermittently. Her breakfast blood glucose is taken between 7-8 am. Her breakfast blood glucose range is generally 130-140 mg/dl. An ACE inhibitor/angiotensin II receptor blocker is being taken. Eye exam is current.     Past Medical History:  Diagnosis Date  . Allergy   . Arthritis    RA  . Carpal tunnel syndrome   . Chronic kidney disease   . Diabetes mellitus without complication (HCC)   . Essential hypertension 06/01/2019  . Hyperlipidemia 08/19/2016  . Hypertension   . PONV (postoperative nausea and vomiting)      Family History   Problem Relation Age of Onset  . Atrial fibrillation Mother   . Stroke Mother   . Heart attack Mother   . Aneurysm Mother   . Atrial fibrillation Brother   . Stroke Brother   . Diabetes Brother   . Diabetes Father   . Alzheimer's disease Father   . Diabetes Brother   . Alzheimer's disease Maternal Grandmother   . Heart attack Maternal Grandfather   . Cancer Paternal Grandmother   . Healthy Son   . Healthy Daughter      Current Outpatient Medications:  .  Accu-Chek FastClix Lancets MISC, Use as instructed to check blood sugars 2 times per day dx: E11.22, Disp: 300 each, Rfl: 2 .  amLODipine (NORVASC) 10 MG tablet, TAKE 1 TABLET BY MOUTH EVERY DAY, Disp: 90 tablet, Rfl: 2 .  aspirin EC 81 MG tablet, Take 81 mg by mouth daily., Disp: , Rfl:  .  cetirizine (ZYRTEC) 10 MG tablet, Take 10 mg by mouth daily., Disp: , Rfl:  .  cyclobenzaprine (FLEXERIL) 10 MG tablet, Take 10 mg by mouth at bedtime as needed for muscle spasms., Disp: , Rfl: 0 .  DULoxetine (CYMBALTA) 20 MG capsule, Take 1 capsule (20 mg total) by mouth daily., Disp: 30 capsule, Rfl: 2 .  gabapentin (NEURONTIN) 300 MG capsule, Take 300 mg by mouth 3 (three) times daily as needed (pain). , Disp: , Rfl:  .  glucose blood (ACCU-CHEK GUIDE) test strip, Use as instructed to check blood sugars 2 times per day dx: E11.22, Disp: 300 each, Rfl: 2 .  insulin detemir (LEVEMIR FLEXTOUCH) 100 UNIT/ML FlexPen, Inject  10 Units into the skin at bedtime., Disp: 15 mL, Rfl: 2 .  Insulin Pen Needle (PEN NEEDLES) 32G X 4 MM MISC, Use as directed with Levemir, Disp: 150 each, Rfl: 2 .  metFORMIN (GLUCOPHAGE) 500 MG tablet, Take 1 tablet (500 mg total) by mouth 2 (two) times daily with a meal., Disp: 180 tablet, Rfl: 2 .  olmesartan-hydrochlorothiazide (BENICAR HCT) 40-25 MG tablet, TAKE 1 TABLET BY MOUTH EVERY DAY, Disp: 90 tablet, Rfl: 1 .  oxyCODONE (OXY IR/ROXICODONE) 5 MG immediate release tablet, Take 1 tablet (5 mg total) by mouth every 4  (four) hours as needed for moderate pain ((score 4 to 6))., Disp: 40 tablet, Rfl: 0 .  OZEMPIC, 0.25 OR 0.5 MG/DOSE, 2 MG/1.5ML SOPN, INJECT 0.5 MG INTO THE SKIN ONCE A WEEK., Disp: 4.5 mL, Rfl: 3 .  rosuvastatin (CRESTOR) 20 MG tablet, TAKE 1 TABLET (20 MG TOTAL) BY MOUTH EVERY MONDAY, WEDNESDAY, AND FRIDAY., Disp: 36 tablet, Rfl: 3   Allergies  Allergen Reactions  . Amoxicillin Hives  . Codeine Hives     Review of Systems  Constitutional: Negative.   Eyes: Negative for blurred vision.  Respiratory: Negative.   Cardiovascular: Negative.   Gastrointestinal: Negative.   Endocrine:       She c/o night sweats. Not sure what is contributing to her sx. Denies change in appetite. No associated fever/chills.   Neurological: Negative.   Psychiatric/Behavioral: Positive for dysphoric mood.       She admits she has been feeling more stressed. She is more stressed at work. 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 Has difficulty sleeping.   All other systems reviewed and are negative.    Today's Vitals   05/13/20 0840  BP: 126/82  Pulse: 73  Temp: 98.2 F (36.8 C)  TempSrc: Oral  Weight: 220 lb 9.6 oz (100.1 kg)  Height: 5' 4.6" (1.641 m)   Body mass index is 37.17 kg/m.  Wt Readings from Last 3 Encounters:  05/13/20 220 lb 9.6 oz (100.1 kg)  03/26/20 224 lb 9.6 oz (101.9 kg)  01/10/20 224 lb 9.6 oz (101.9 kg)   Objective:  Physical Exam Vitals and nursing note reviewed.  Constitutional:      Appearance: Normal appearance. She is obese.  HENT:     Head: Normocephalic and atraumatic.     Nose:     Comments: Masked     Mouth/Throat:     Comments: Masked  Cardiovascular:     Rate and Rhythm: Normal rate and regular rhythm.     Heart sounds: Normal heart sounds.  Pulmonary:     Effort: Pulmonary effort is normal.     Breath sounds: Normal breath sounds.  Musculoskeletal:     Cervical back: Normal range of motion.  Skin:    General: Skin is warm.  Neurological:     General: No  focal deficit present.     Mental Status: She is alert.  Psychiatric:        Mood and Affect: Mood normal.        Behavior: Behavior normal.         Assessment And Plan:     1. Hypertensive nephropathy Comments: Chronic, well controlled. She is encouraged to follow low sodium diet.   2. Type 2 diabetes mellitus with stage 2 chronic kidney disease, without long-term current use of insulin (HCC) Comments: Chronic, will consider another try w/ [place]. She agrees to consider switching to plain olmesartan when resuming [place]. I will check a1c  today.  - Hemoglobin A1c  3. Adjustment disorder with mixed anxiety and depressed mood Comments: Chronic. She is dealing with both work stress and caregiver stress. She is encouraged to consider EAP through her job.   4. Night sweats Comments: I will check thyroid function.Pt advised to check sugars when she has night sweats.  - TSH  5. Class 2 severe obesity due to excess calories with serious comorbidity and body mass index (BMI) of 37.0 to 37.9 in adult [facility]) Comments: She was congratulated on her 4 pound weight loss.She is encouraged to incorporate more exercise into her weekend routine.   Patient was given opportunity to ask questions. Patient verbalized understanding of the plan and was able to repeat key elements of the plan. All questions were answered to their satisfaction.   I, [name], MD, have reviewed all documentation for this visit. The documentation on 05/13/20 for the exam, diagnosis, procedures, and orders are all accurate and complete.   IF YOU HAVE BEEN REFERRED TO A SPECIALIST, IT MAY TAKE 1-2 WEEKS TO SCHEDULE/PROCESS THE REFERRAL. IF YOU HAVE NOT HEARD FROM US/SPECIALIST IN TWO WEEKS, PLEASE GIVE [place] A CALL AT [phone] X [phone].   THE PATIENT IS ENCOURAGED TO PRACTICE SOCIAL DISTANCING DUE TO THE COVID-19 PANDEMIC.

## 2020-05-13 NOTE — Patient Instructions (Addendum)
Adjustment Disorder, Adult Adjustment disorder is a group of symptoms that can develop after a stressful life event, such as the loss of a job or a serious physical illness. The symptoms can affect how you feel, think, and act. They may also interfere with your relationships. Adjustment disorder increases your risk of suicide and substance abuse. If adjustment disorder is not managed early, it can make medical conditions that you already have worse. If the stressful life event persists, the disorder may continue and become a persistent form of adjustment disorder. What are the causes? This condition is caused by difficulty recovering from or coping with a stressful life event. What increases the risk? You are more likely to develop this condition if:  You have had previous problems coping with life stressors.  You are being treated for a long-term (chronic) illness.  You are being treated for an illness that cannot be cured (terminal illness).  You have a family history of mental illness. What are the signs or symptoms? Symptoms of this condition include:  Behavioral symptoms such as: ? Trouble doing daily tasks. ? Reckless driving. ? Poor work performance. ? Ignoring bills. ? Avoiding family and friends. ? Impulsive actions.  Emotional symptoms such as: ? Sadness, depression, or crying spells. ? Worrying a lot, or feeling nervous or anxious. ? Loss of enjoyment. ? Feelings of loss or hopelessness. ? Irritability. ? Thoughts of suicide.  Physical symptoms such as: ? Change in appetite or weight. ? Complaining of feeling sick without being ill. ? Feeling dazed or disconnected. ? Nightmares. ? Trouble sleeping. Symptoms of this condition start within 3 months of the stressful event. They do not last more than 6 months, unless the stressful circumstances last longer. Normal grieving after the death of a loved one is not a symptom of this condition. How is this diagnosed? To  diagnose this condition, your health care provider will ask about what has happened in your life and how it has affected you. He or she may also ask about your medical history and your use of medicines, alcohol, and other substances. Your health care provider may do a physical exam and order lab tests or other studies. You may be referred to a mental health specialist. How is this treated? Treatment options for this condition include:  Counseling or talk therapy. Talk therapy is usually provided by mental health specialists. This therapy may be individual or may involve family members.  Medicines. Certain medicines may help with depression, anxiety, and sleep.  Support groups. These offer emotional support, advice, and guidance. They are made up of people who have had similar experiences.  Observation and time. This is sometimes called watchful waiting. In this treatment, health care providers monitor your health and behavior without other treatment. Adjustment disorder sometimes gets better on its own with time.   Follow these instructions at home:  Take over-the-counter and prescription medicines only as told by your health care provider.  Keep all follow-up visits. This is important.  Contact trusted family and friends for support. Let them know what is going on with you and how they can help. Contact a health care provider if:  Your symptoms do not improve in 6 months.  Your symptoms get worse. Get help right away if:  You have serious thoughts about hurting yourself or someone else. If you ever feel like you may hurt yourself or others, or have thoughts about taking your own life, get help right away. Go to your nearest emergency   department or:  Call your local emergency services (911 in the [place]  Call a suicide crisis helpline, such as the National Suicide Prevention Lifeline at 1-800-273-8255. This is open 24 hours a day in the [place]  Text the Crisis Text Line at 741741 (in  the [place] Summary  Adjustment disorder is a group of symptoms that can develop after a stressful life event, such as the loss of a job or a serious physical illness. The symptoms can affect how you feel, think, and act. They may interfere with your relationships.  Symptoms of this condition start within 3 months of the stressful event. They do not last more than 6 months, unless the stressful circumstances last longer.  Treatment may include talk therapy, medicines, participation in a support group, or observation to see if symptoms improve.  Contact your health care provider if your symptoms get worse or do not improve in 6 months.  If you ever feel like you may hurt yourself or others, or have thoughts about taking your own life, get help right away. This information is not intended to replace advice given to you by your health care provider. Make sure you discuss any questions you have with your health care provider. Document Revised: 06/08/2019 Document Reviewed: 06/08/2019 Elsevier Patient Education  2021 Elsevier Inc.  

## 2020-05-14 LAB — HEMOGLOBIN A1C
Est. average glucose Bld gHb Est-mCnc: 171 mg/dL
Hgb A1c MFr Bld: 7.6 % — ABNORMAL HIGH (ref 4.8–5.6)

## 2020-05-14 LAB — TSH: TSH: 1.68 u[IU]/mL (ref 0.450–4.500)

## 2020-05-16 ENCOUNTER — Telehealth: Payer: Self-pay

## 2020-05-16 NOTE — Telephone Encounter (Signed)
-----   Message from Dorothyann Peng, MD sent at 05/16/2020  9:57 AM EDT ----- Your hba1c is 7.6, down from last a1c. Congrats! Keep up the great work!   Thyroid fxn is nl.

## 2020-05-16 NOTE — Telephone Encounter (Signed)
I left the pt a message to call the office for lab results or log on into her mychart account.

## 2020-06-04 ENCOUNTER — Other Ambulatory Visit: Payer: Self-pay | Admitting: Internal Medicine

## 2020-06-24 ENCOUNTER — Ambulatory Visit (INDEPENDENT_AMBULATORY_CARE_PROVIDER_SITE_OTHER): Payer: BC Managed Care – PPO | Admitting: Internal Medicine

## 2020-06-24 ENCOUNTER — Encounter: Payer: Self-pay | Admitting: Internal Medicine

## 2020-06-24 ENCOUNTER — Other Ambulatory Visit: Payer: Self-pay

## 2020-06-24 VITALS — BP 112/80 | HR 90 | Temp 98.3°F | Ht 64.6 in | Wt 216.3 lb

## 2020-06-24 DIAGNOSIS — F4323 Adjustment disorder with mixed anxiety and depressed mood: Secondary | ICD-10-CM

## 2020-06-24 DIAGNOSIS — M542 Cervicalgia: Secondary | ICD-10-CM | POA: Diagnosis not present

## 2020-06-24 DIAGNOSIS — R110 Nausea: Secondary | ICD-10-CM

## 2020-06-24 MED ORDER — DULOXETINE HCL 30 MG PO CPEP: 30.0000 mg | ORAL_CAPSULE | Freq: Every day | ORAL | 2 refills | Status: DC

## 2020-06-24 NOTE — Patient Instructions (Signed)
[email or web address]">  Generalized Anxiety Disorder, Adult Generalized anxiety disorder (GAD) is a mental health condition. Unlike normal worries, anxiety related to GAD is not triggered by a specific event. These worries do not fade or get better with time. GAD interferes with relationships, work, and school. GAD symptoms can vary from mild to severe. People with severe GAD can have intense waves of anxiety with physical symptoms that are similar to panic attacks. What are the causes? The exact cause of GAD is not known, but the following are believed to have an impact:  Differences in natural brain chemicals.  Genes passed down from parents to children.  Differences in the way threats are perceived.  Development during childhood.  Personality. What increases the risk? The following factors may make you more likely to develop this condition:  Being female.  Having a family history of anxiety disorders.  Being very shy.  Experiencing very stressful life events, such as the death of a loved one.  Having a very stressful family environment. What are the signs or symptoms? People with GAD often worry excessively about many things in their lives, such as their health and family. Symptoms may also include:  Mental and emotional symptoms: ? Worrying excessively about natural disasters. ? Fear of being late. ? Difficulty concentrating. ? Fears that others are judging your performance.  Physical symptoms: ? Fatigue. ? Headaches, muscle tension, muscle twitches, trembling, or feeling shaky. ? Feeling like your heart is pounding or beating very fast. ? Feeling out of breath or like you cannot take a deep breath. ? Having trouble falling asleep or staying asleep, or experiencing restlessness. ? Sweating. ? Nausea, diarrhea, or irritable bowel syndrome (IBS).  Behavioral symptoms: ? Experiencing erratic moods or irritability. ? Avoidance of new situations. ? Avoidance of  people. ? Extreme difficulty making decisions. How is this diagnosed? This condition is diagnosed based on your symptoms and medical history. You will also have a physical exam. Your health care provider may perform tests to rule out other possible causes of your symptoms. To be diagnosed with GAD, a person must have anxiety that:  Is out of his or her control.  Affects several different aspects of his or her life, such as work and relationships.  Causes distress that makes him or her unable to take part in normal activities.  Includes at least three symptoms of GAD, such as restlessness, fatigue, trouble concentrating, irritability, muscle tension, or sleep problems. Before your health care provider can confirm a diagnosis of GAD, these symptoms must be present more days than they are not, and they must last for 6 months or longer. How is this treated? This condition may be treated with:  Medicine. Antidepressant medicine is usually prescribed for long-term daily control. Anti-anxiety medicines may be added in severe cases, especially when panic attacks occur.  Talk therapy (psychotherapy). Certain types of talk therapy can be helpful in treating GAD by providing support, education, and guidance. Options include: ? Cognitive behavioral therapy (CBT). People learn coping skills and self-calming techniques to ease their physical symptoms. They learn to identify unrealistic thoughts and behaviors and to replace them with more appropriate thoughts and behaviors. ? Acceptance and commitment therapy (ACT). This treatment teaches people how to be mindful as a way to cope with unwanted thoughts and feelings. ? Biofeedback. This process trains you to manage your body's response (physiological response) through breathing techniques and relaxation methods. You will work with a therapist while machines are used to monitor your physical   symptoms.  Stress management techniques. These include yoga,  meditation, and exercise. A mental health specialist can help determine which treatment is best for you. Some people see improvement with one type of therapy. However, other people require a combination of therapies.   Follow these instructions at home: Lifestyle  Maintain a consistent routine and schedule.  Anticipate stressful situations. Create a plan, and allow extra time to work with your plan.  Practice stress management or self-calming techniques that you have learned from your therapist or your health care provider. General instructions  Take over-the-counter and prescription medicines only as told by your health care provider.  Understand that you are likely to have setbacks. Accept this and be kind to yourself as you persist to take better care of yourself.  Recognize and accept your accomplishments, even if you judge them as small.  Keep all follow-up visits as told by your health care provider. This is important. Contact a health care provider if:  Your symptoms do not get better.  Your symptoms get worse.  You have signs of depression, such as: ? A persistently sad or irritable mood. ? Loss of enjoyment in activities that used to bring you joy. ? Change in weight or eating. ? Changes in sleeping habits. ? Avoiding friends or family members. ? Loss of energy for normal tasks. ? Feelings of guilt or worthlessness. Get help right away if:  You have serious thoughts about hurting yourself or others. If you ever feel like you may hurt yourself or others, or have thoughts about taking your own life, get help right away. Go to your nearest emergency department or:  Call your local emergency services (911 in the [place]  Call a suicide crisis helpline, such as the National Suicide Prevention Lifeline at 1-800-273-8255. This is open 24 hours a day in the [place]  Text the Crisis Text Line at 741741 (in the [place] Summary  Generalized anxiety disorder (GAD) is a mental  health condition that involves worry that is not triggered by a specific event.  People with GAD often worry excessively about many things in their lives, such as their health and family.  GAD may cause symptoms such as restlessness, trouble concentrating, sleep problems, frequent sweating, nausea, diarrhea, headaches, and trembling or muscle twitching.  A mental health specialist can help determine which treatment is best for you. Some people see improvement with one type of therapy. However, other people require a combination of therapies. This information is not intended to replace advice given to you by your health care provider. Make sure you discuss any questions you have with your health care provider. Document Revised: 11/15/2018 Document Reviewed: 11/15/2018 Elsevier Patient Education  2021 Elsevier Inc.  

## 2020-06-24 NOTE — Progress Notes (Signed)
I,[name],acting as a Neurosurgeon for [name], MD.,have documented all relevant documentation on the behalf of [name], MD,as directed by  [name], MD while in the presence of [name], MD.  This visit occurred during the SARS-CoV-2 public health emergency.  Safety protocols were in place, including screening questions prior to the visit, additional usage of staff PPE, and extensive cleaning of exam room while observing appropriate contact time as indicated for disinfecting solutions.  Subjective:     Patient ID: Ashley Monroe , female    DOB: 02-03-1956 , 65 y.o.   MRN: [id number]   Chief Complaint  Patient presents with  . Duloxetine f/u    HPI  The patient is here for a follow-up on anxiety.  The patient was recently started on Duloxetine 20 mg daily.   She reports it has helped with her mood.  She has also noticed that it has helped ease her chronic neck pain.   Anxiety Presents for follow-up visit. Symptoms include muscle tension, nausea and nervous/anxious behavior. Patient reports no palpitations, panic or shortness of breath. Symptoms occur occasionally. The severity of symptoms is mild.       Past Medical History:  Diagnosis Date  . Allergy   . Arthritis    RA  . Carpal tunnel syndrome   . Chronic kidney disease   . Diabetes mellitus without complication (HCC)   . Essential hypertension 06/01/2019  . Hyperlipidemia 08/19/2016  . Hypertension   . PONV (postoperative nausea and vomiting)      Family History  Problem Relation Age of Onset  . Atrial fibrillation Mother   . Stroke Mother   . Heart attack Mother   . Aneurysm Mother   . Atrial fibrillation Brother   . Stroke Brother   . Diabetes Brother   . Diabetes Father   . Alzheimer's disease Father   . Diabetes Brother   . Alzheimer's disease Maternal Grandmother   . Heart attack Maternal Grandfather   . Cancer Paternal Grandmother   . Healthy Son   . Healthy Daughter       Current Outpatient Medications:  .  amLODipine (NORVASC) 10 MG tablet, TAKE 1 TABLET BY MOUTH EVERY DAY, Disp: 90 tablet, Rfl: 2 .  aspirin EC 81 MG tablet, Take 81 mg by mouth daily., Disp: , Rfl:  .  cetirizine (ZYRTEC) 10 MG tablet, Take 10 mg by mouth daily., Disp: , Rfl:  .  cyclobenzaprine (FLEXERIL) 10 MG tablet, Take 10 mg by mouth at bedtime as needed for muscle spasms., Disp: , Rfl: 0 .  DULoxetine (CYMBALTA) 30 MG capsule, Take 1 capsule (30 mg total) by mouth daily., Disp: 30 capsule, Rfl: 2 .  glucose blood (ACCU-CHEK GUIDE) test strip, Use as instructed to check blood sugars 2 times per day dx: E11.22, Disp: 300 each, Rfl: 2 .  insulin detemir (LEVEMIR FLEXTOUCH) 100 UNIT/ML FlexPen, Inject 10 Units into the skin at bedtime., Disp: 15 mL, Rfl: 2 .  Insulin Pen Needle (PEN NEEDLES) 32G X 4 MM MISC, Use as directed with Levemir, Disp: 150 each, Rfl: 2 .  metFORMIN (GLUCOPHAGE) 500 MG tablet, Take 1 tablet (500 mg total) by mouth 2 (two) times daily with a meal., Disp: 180 tablet, Rfl: 2 .  olmesartan-hydrochlorothiazide (BENICAR HCT) 40-25 MG tablet, TAKE 1 TABLET BY MOUTH EVERY DAY, Disp: 90 tablet, Rfl: 1 .  oxyCODONE (OXY IR/ROXICODONE) 5 MG immediate release tablet, Take 1 tablet (5 mg total)  by mouth every 4 (four) hours as needed for moderate pain ((score 4 to 6))., Disp: 40 tablet, Rfl: 0 .  OZEMPIC, 0.25 OR 0.5 MG/DOSE, 2 MG/1.5ML SOPN, INJECT 0.5 MG INTO THE SKIN ONCE A WEEK., Disp: 4.5 mL, Rfl: 3 .  rosuvastatin (CRESTOR) 20 MG tablet, TAKE 1 TABLET (20 MG TOTAL) BY MOUTH EVERY MONDAY, WEDNESDAY, AND FRIDAY., Disp: 36 tablet, Rfl: 3 .  Accu-Chek FastClix Lancets MISC, Use as instructed to check blood sugars 2 times per day dx: E11.22, Disp: 300 each, Rfl: 2   Allergies  Allergen Reactions  . Amoxicillin Hives  . Codeine Hives     Review of Systems  Constitutional: Negative.   Respiratory: Negative.  Negative for shortness of breath.   Cardiovascular: Negative.   Negative for palpitations.  Gastrointestinal: Positive for nausea.       She does admit to occasional nausea. Sx started once she started duloxetine. Denies vomiting and other GI sx.   Psychiatric/Behavioral: The patient is nervous/anxious.   All other systems reviewed and are negative.    Today's Vitals   06/24/20 0946  BP: 112/80  Pulse: 90  Temp: 98.3 F (36.8 C)  TempSrc: Oral  Weight: 216 lb 4.8 oz (98.1 kg)  Height: 5' 4.6" (1.641 m)   Body mass index is 36.44 kg/m.  Wt Readings from Last 3 Encounters:  06/24/20 216 lb 4.8 oz (98.1 kg)  05/13/20 220 lb 9.6 oz (100.1 kg)  03/26/20 224 lb 9.6 oz (101.9 kg)   BP Readings from Last 3 Encounters:  06/24/20 112/80  05/13/20 126/82  03/26/20 122/70   Objective:  Physical Exam Vitals and nursing note reviewed.  Constitutional:      Appearance: Normal appearance.  HENT:     Head: Normocephalic and atraumatic.     Nose:     Comments: Masked     Mouth/Throat:     Comments: Masked  Cardiovascular:     Rate and Rhythm: Normal rate and regular rhythm.     Heart sounds: Normal heart sounds.  Pulmonary:     Effort: Pulmonary effort is normal.     Breath sounds: Normal breath sounds.  Skin:    General: Skin is warm.  Neurological:     General: No focal deficit present.     Mental Status: She is alert.  Psychiatric:        Mood and Affect: Mood normal.        Behavior: Behavior normal.         Assessment And Plan:     1. Adjustment disorder with mixed anxiety and depressed mood Comments: Improved with duloxetine 20mg . Would like to increase to 30mg  dose. She will f/u July 2022 at regularly scheduled visit.   2. Cervicalgia Comments: Chronic, improved.   3. Nausea Comments: Pt advised to take duloxetine with evening meal instead of bedtime. She agrees to touch base in a week or two to give an update on how she is doing.    Patient was given opportunity to ask questions. Patient verbalized understanding of the  plan and was able to repeat key elements of the plan. All questions were answered to their satisfaction.   I, , MD, have reviewed all documentation for this visit. The documentation on 06/24/20 for the exam, diagnosis, procedures, and orders are all accurate and complete.   IF YOU HAVE BEEN REFERRED TO A SPECIALIST, IT MAY TAKE 1-2 WEEKS TO SCHEDULE/PROCESS THE REFERRAL. IF YOU HAVE NOT HEARD FROM  US/SPECIALIST IN TWO WEEKS, PLEASE GIVE [place] A CALL AT [phone] X [phone].   THE PATIENT IS ENCOURAGED TO PRACTICE SOCIAL DISTANCING DUE TO THE COVID-19 PANDEMIC.

## 2020-07-21 ENCOUNTER — Other Ambulatory Visit: Payer: Self-pay | Admitting: Internal Medicine

## 2020-07-28 ENCOUNTER — Other Ambulatory Visit: Payer: Self-pay

## 2020-07-28 ENCOUNTER — Ambulatory Visit (INDEPENDENT_AMBULATORY_CARE_PROVIDER_SITE_OTHER): Payer: BC Managed Care – PPO | Admitting: Nurse Practitioner

## 2020-07-28 ENCOUNTER — Encounter: Payer: Self-pay | Admitting: Nurse Practitioner

## 2020-07-28 VITALS — BP 137/82 | HR 115 | Temp 97.6°F | Ht 64.6 in | Wt 214.0 lb

## 2020-07-28 DIAGNOSIS — I10 Essential (primary) hypertension: Secondary | ICD-10-CM

## 2020-07-28 DIAGNOSIS — R059 Cough, unspecified: Secondary | ICD-10-CM

## 2020-07-28 DIAGNOSIS — U071 COVID-19: Secondary | ICD-10-CM | POA: Diagnosis not present

## 2020-07-28 MED ORDER — IPRATROPIUM BROMIDE 0.03 % NA SOLN: 2.0000 | Freq: Four times a day (QID) | NASAL | 3 refills | Status: DC | PRN

## 2020-07-28 MED ORDER — BENZONATATE 100 MG PO CAPS: 100.0000 mg | ORAL_CAPSULE | Freq: Four times a day (QID) | ORAL | 1 refills | Status: AC | PRN

## 2020-07-28 NOTE — Progress Notes (Addendum)
Telephone Visit via done due to poor connectivity with video   This visit type was conducted due to national recommendations for restrictions regarding the COVID-19 Pandemic (e.g. social distancing) in an effort to limit this patient's exposure and mitigate transmission in our community.  Due to her co-morbid illnesses, this patient is at least at moderate risk for complications without adequate follow up.  This format is felt to be most appropriate for this patient at this time.  All issues noted in this document were discussed and addressed.  A limited physical exam was performed with this format.    This visit type was conducted due to national recommendations for restrictions regarding the COVID-19 Pandemic (e.g. social distancing) in an effort to limit this patient's exposure and mitigate transmission in our community.  Patients identity confirmed using two different identifiers.  This format is felt to be most appropriate for this patient at this time.  All issues noted in this document were discussed and addressed.  No physical exam was performed (except for noted visual exam findings with Video Visits).    Date:  07/28/2020   ID:  Ashley Monroe, DOB August 19, 1955, MRN [id number]  Patient Location:  Home - spoke with [facility]  Provider location:   Office    Chief Complaint:  positive for covid  History of Present Illness:    Ashley Monroe is a 65 y.o. female who presents via  telephone conferencing for a telehealth visit today.    The patient does have symptoms concerning for COVID-19 infection (fever, chills, cough, or new shortness of breath).   She is positive for covid symptoms started on Thursday. She did a home test which was positive and had another rapid done at [facility] which was positive. She had muscle aches, coughing, headache and ear itching. She has had 2 vaccines and one booster.   She feels better now, has coughing worse at night. She is unable to  expectorate the phlegm. She is has not had any mucinex due to causes her blood sugar to increase. The cough will keep her up at night. She reports a history of bronchitis. She is staying well hydrated with water and ginger ale    Past Medical History:  Diagnosis Date   Allergy    Arthritis    RA   Carpal tunnel syndrome    Chronic kidney disease    Diabetes mellitus without complication (HCC)    Essential hypertension 06/01/2019   Hyperlipidemia 08/19/2016   Hypertension    PONV (postoperative nausea and vomiting)    Past Surgical History:  Procedure Laterality Date   ABDOMINAL HYSTERECTOMY  2003   ANTERIOR CERVICAL DECOMP/DISCECTOMY FUSION N/A 07/30/2019   Procedure: Anterior Cervicql Decompression Fusion - Cervcial four-Cervical five - Cervical five-Cervical six;  Surgeon: Tia Alert, MD;  Location: [facility];  Service: Neurosurgery;  Laterality: N/A;  3C   CERVICAL SPINE SURGERY  08/2017   CHOLECYSTECTOMY  1982   OVARIAN CYST REMOVAL  1981     Current Meds  Medication Sig   Accu-Chek FastClix Lancets MISC Use as instructed to check blood sugars 2 times per day dx: E11.22   amLODipine (NORVASC) 10 MG tablet TAKE 1 TABLET BY MOUTH EVERY DAY   aspirin EC 81 MG tablet Take 81 mg by mouth daily.   benzonatate (TESSALON PERLES) 100 MG capsule Take 1 capsule (100 mg total) by mouth every 6 (six) hours as needed.   cetirizine (ZYRTEC) 10 MG tablet Take 10 mg  by mouth daily.   cyclobenzaprine (FLEXERIL) 10 MG tablet Take 10 mg by mouth at bedtime as needed for muscle spasms.   DULoxetine (CYMBALTA) 30 MG capsule TAKE 1 CAPSULE BY MOUTH EVERY DAY   glucose blood (ACCU-CHEK GUIDE) test strip Use as instructed to check blood sugars 2 times per day dx: E11.22   insulin detemir (LEVEMIR FLEXTOUCH) 100 UNIT/ML FlexPen Inject 10 Units into the skin at bedtime.   Insulin Pen Needle (PEN NEEDLES) 32G X 4 MM MISC Use as directed with Levemir   ipratropium (ATROVENT) 0.03 % nasal spray Place 2  sprays into both nostrils 4 (four) times daily as needed for rhinitis.   metFORMIN (GLUCOPHAGE) 500 MG tablet Take 1 tablet (500 mg total) by mouth 2 (two) times daily with a meal.   olmesartan-hydrochlorothiazide (BENICAR HCT) 40-25 MG tablet TAKE 1 TABLET BY MOUTH EVERY DAY   oxyCODONE (OXY IR/ROXICODONE) 5 MG immediate release tablet Take 1 tablet (5 mg total) by mouth every 4 (four) hours as needed for moderate pain ((score 4 to 6)).   OZEMPIC, 0.25 OR 0.5 MG/DOSE, 2 MG/1.5ML SOPN INJECT 0.5 MG INTO THE SKIN ONCE A WEEK.   rosuvastatin (CRESTOR) 20 MG tablet TAKE 1 TABLET (20 MG TOTAL) BY MOUTH EVERY MONDAY, WEDNESDAY, AND FRIDAY.     Allergies:   Amoxicillin and Codeine   Social History   Tobacco Use   Smoking status: Never   Smokeless tobacco: Never  Vaping Use   Vaping Use: Never used  Substance Use Topics   Alcohol use: Never    Alcohol/week: 0.0 standard drinks   Drug use: Never     Family Hx: The patient's family history includes Alzheimer's disease in her father and maternal grandmother; Aneurysm in her mother; Atrial fibrillation in her brother and mother; Cancer in her paternal grandmother; Diabetes in her brother, brother, and father; Healthy in her daughter and son; Heart attack in her maternal grandfather and mother; Stroke in her brother and mother.  ROS:   Please see the history of present illness.    Review of Systems  Constitutional:  Positive for malaise/fatigue. Negative for chills and fever.  Respiratory:  Positive for cough. Negative for shortness of breath and wheezing.   Cardiovascular:  Negative for chest pain.  Neurological:  Positive for headaches.  Psychiatric/Behavioral: Negative.     All other systems reviewed and are negative.   Labs/Other Tests and Data Reviewed:    Recent Labs: 08/27/2019: ALT 18 03/26/2020: BUN 16; Creatinine, Ser 0.94; Potassium 4.3; Sodium 141 05/13/2020: TSH 1.680   Recent Lipid Panel Lab Results  Component Value  Date/Time   CHOL 186 01/10/2020 11:47 AM   TRIG 170 (H) 01/10/2020 11:47 AM   HDL 39 (L) 01/10/2020 11:47 AM   CHOLHDL 4.8 (H) 01/10/2020 11:47 AM   LDLCALC 117 (H) 01/10/2020 11:47 AM    Wt Readings from Last 3 Encounters:  07/28/20 214 lb (97.1 kg)  06/24/20 216 lb 4.8 oz (98.1 kg)  05/13/20 220 lb 9.6 oz (100.1 kg)     Exam:    Vital Signs:  BP (!) 144/101 (BP Location: Right Arm, Patient Position: Sitting, Cuff Size: Large)   Pulse (!) 115   Temp 97.6 F (36.4 C)   Ht 5' 4.6" (1.641 m)   Wt 214 lb (97.1 kg)   BMI 36.05 kg/m     Physical Exam Vitals reviewed.  Constitutional:      General: She is not in acute distress.  Appearance: Normal appearance. She is obese.  Pulmonary:     Effort: Pulmonary effort is normal. No respiratory distress.     Breath sounds: No wheezing.  Neurological:     General: No focal deficit present.     Mental Status: She is alert and oriented to person, place, and time.     Cranial Nerves: No cranial nerve deficit.     Motor: No weakness.  Psychiatric:        Mood and Affect: Mood normal.        Behavior: Behavior normal.        Thought Content: Thought content normal.        Judgment: Judgment normal.    ASSESSMENT & PLAN:    1. COVID-19 She has had 2 positive test, she is to remain in isolation for 5 days if no symptoms may return to work otherwise needs to be out for 10 days If has shortness of breath or chest pain to go to ER  2. Cough Tessalon perles and atrovent inhaler otherwise can use over the counter HBP coricidan brand symptom mamagement - benzonatate (TESSALON PERLES) 100 MG capsule; Take 1 capsule (100 mg total) by mouth every 6 (six) hours as needed.  Dispense: 30 capsule; Refill: 1 - ipratropium (ATROVENT) 0.03 % nasal spray; Place 2 sprays into both nostrils 4 (four) times daily as needed for rhinitis.  Dispense: 30 mL; Refill: 3  3. Essential hypertension Chronic, fair control Continue current  medications   COVID-19 Education: The signs and symptoms of COVID-19 were discussed with the patient and how to seek care for testing (follow up with PCP or arrange E-visit).  The importance of social distancing was discussed today.  Patient Risk:   After full review of this patients clinical status, I feel that they are at least moderate risk at this time.  Time:   Today, I have spent 16 minutes/ seconds with the patient with telehealth technology discussing above diagnoses, visit done via telephone.     Medication Adjustments/Labs and Tests Ordered: Current medicines are reviewed at length with the patient today.  Concerns regarding medicines are outlined above.   Tests Ordered: No orders of the defined types were placed in this encounter.   Medication Changes: Meds ordered this encounter  Medications   benzonatate (TESSALON PERLES) 100 MG capsule    Sig: Take 1 capsule (100 mg total) by mouth every 6 (six) hours as needed.    Dispense:  30 capsule    Refill:  1   ipratropium (ATROVENT) 0.03 % nasal spray    Sig: Place 2 sprays into both nostrils 4 (four) times daily as needed for rhinitis.    Dispense:  30 mL    Refill:  3     Disposition:  Follow up prn  Signed, Arnette Felts, FNP

## 2020-07-28 NOTE — Patient Instructions (Signed)
COVID-19: Quarantine and Isolation Quarantine If you were exposed Quarantine and stay away from others when you have been in close contact with someone whohas COVID-19. Isolate If you are sick or test positive Isolate when you are sick or when you have COVID-19, even if you don't have symptoms. When to stay home Calculating quarantine The date of your exposure is considered day 0. Day 1 is the first full day after your last contact with a person who has had COVID-19. Stay home and away from other people for at least 5 days. Learn why CDC updated guidance for the general public. IF YOU were exposed to COVID-19 and are NOT up-to-date IF YOU were exposed to COVID-19 and are NOT on COVID-19 vaccinations Quarantine for at least 5 days Stay home Stay home and quarantine for at least 5 full days. Wear a well-fitted mask if you must be around others in your home. Do not travel. Get tested Even if you don't develop symptoms, get tested at least 5 days after you last had close contact with someone with COVID-19. After quarantine Watch for symptoms Watch for symptoms until 10 days after you last had close contact with someone with COVID-19. Avoid travel It is best to avoid travel until a full 10 days after you last had close contact with someone with COVID-19. If you develop symptoms Isolate immediately and get tested. Continue to stay home until you know the results. Wear a well-fitted mask around others. Take precautions until day 10 Wear a mask Wear a well-fitted mask for 10 full days any time you are around others inside your home or in public. Do not go to places where you are unable to wear a mask. If you must travel during days 6-10, take precautions. Avoid being around people who are at high risk IF YOU were exposed to COVID-19 and are up-to-date IF YOU were exposed to COVID-19 and are on COVID-19 vaccinations No quarantine You do not need to stay home unless you develop  symptoms. Get tested Even if you don't develop symptoms, get tested at least 5 days after you last had close contact with someone with COVID-19. Watch for symptoms Watch for symptoms until 10 days after you last had close contact with someone with COVID-19. If you develop symptoms Isolate immediately and get tested. Continue to stay home until you know the results. Wear a well-fitted mask around others. Take precautions until day 10 Wear a mask Wear a well-fitted mask for 10 full days any time you are around others inside your home or in public. Do not go to places where you are unable to wear a mask. Take precautions if traveling Avoid being around people who are at high risk IF YOU were exposed to COVID-19 and had confirmed COVID-19 within the past 90 days (you tested positive using a viral test) No quarantine You do not need to stay home unless you develop symptoms. Watch for symptoms Watch for symptoms until 10 days after you last had close contact with someone with COVID-19. If you develop symptoms Isolate immediately and get tested. Continue to stay home until you know the results. Wear a well-fitted mask around others. Take precautions until day 10 Wear a mask Wear a well-fitted mask for 10 full days any time you are around others inside your home or in public. Do not go to places where you are unable to wear a mask. Take precautions if traveling Avoid being around people who are at high risk Calculating isolation   Day 0 is your first day of symptoms or a positive viral test. Day 1 is the first full day after your symptoms developed or your test specimen was collected. If you have COVID-19 or have symptoms, isolate for at least 5 days. IF YOU tested positive for COVID-19 or have symptoms, regardless of vaccination status Stay home for at least 5 days Stay home for 5 days and isolate from others in your home. Wear a well-fitted mask if you must be around others in your home. Do not  travel. Ending isolation if you had symptoms End isolation after 5 full days if you are fever-free for 24 hours (without the use of fever-reducing medication) and your symptoms are improving. Ending isolation if you did NOT have symptoms End isolation after at least 5 full days after your positive test. If you were severely ill with COVID-19 or are immunocompromised You should isolate for at least 10 days. Consult your doctor before ending isolation. Take precautions until day 10 Wear a mask Wear a well-fitted mask for 10 full days any time you are around others inside your home or in public. Do not go to places where you are unable to wear a mask. Do not travel Do not travel until a full 10 days after your symptoms started or the date your positive test was taken if you had no symptoms. Avoid being around people who are at high risk Definitions Exposure Contact with someone infected with SARS-CoV-2, the virus that causes COVID-19,in a way that increases the likelihood of getting infected with the virus. Close contact A close contact is someone who was less than 6 feet away from an infected person (laboratory-confirmed or a clinical diagnosis) for a cumulative total of 15 minutes or more over a 24-hour period. For example, three individual 5-minute exposures for a total of 15 minutes. People who are exposed to someone with COVID-19 after they completed at least 5 days of isolation are notconsidered close contacts. Quarantine Quarantine is a strategy used to prevent transmission of COVID-19 by keeping people who have been in close contact with someone with COVID-19 apart from others. Who does not need to quarantine? If you had close contact with someone with COVID-19 and you are in one of the following groups, you do not need to quarantine. You are up to date with your COVID-19 vaccines. You had confirmed COVID-19 within the last 90 days (meaning you tested positive using a viral test). You  should wear a well-fitting mask around others for 10 days from the date of your last close contact with someone with COVID-19 (the date of last close contact is considered day 0). Get tested at least 5 days after you last had close contact with someone with COVID-19. If you test positive or develop COVID-19 symptoms, isolate from other people and follow recommendations in the Isolation section below. If you tested positive for COVID-19 with a viral test within the previous 90 days and subsequently recovered and remain without COVID-19 symptoms, you do not need to quarantine or get tested after close contact. You should wear a well-fitting mask around others for 10 days from the date of your last close contact withsomeone with COVID-19 (the date of last close contact is considered day 0). Who should quarantine? If you come into close contact with someone with COVID-19, you should quarantine if you are not up to date on COVID-19 vaccines. This includes people who are not vaccinated. What to do for quarantine Stay home and away   from other people for at least 5 days (day 0 through day 5) after your last contact with a person who has COVID-19. The date of your exposure is considered day 0. Wear a well-fitting mask when around others at home, if possible. For 10 days after your last close contact with someone with COVID-19, watch for fever (100.4F or greater), cough, shortness of breath, or other COVID-19 symptoms. If you develop symptoms, get tested immediately and isolate until you receive your test results. If you test positive, follow isolation recommendations. If you do not develop symptoms, get tested at least 5 days after you last had close contact with someone with COVID-19. If you test negative, you can leave your home, but continue to wear a well-fitting mask when around others at home and in public until 10 days after your last close contact with someone with COVID-19. If you test positive, you should  isolate for at least 5 days from the date of your positive test (if you do not have symptoms). If you do develop COVID-19 symptoms, isolate for at least 5 days from the date your symptoms began (the date the symptoms started is day 0). Follow recommendations in the isolation section below. If you are unable to get a test 5 days after last close contact with someone with COVID-19, you can leave your home after day 5 if you have been without COVID-19 symptoms throughout the 5-day period. Wear a well-fitting mask for 10 days after your date of last close contact when around others at home and in public. Avoid people who are immunocompromised or at high risk for severe disease, and nursing homes and other high-risk settings, until after at least 10 days. If possible, stay away from people you live with, especially people who are at higher risk for getting very sick from COVID-19, as well as others outside your home throughout the full 10 days after your last close contact with someone with COVID-19. If you are unable to quarantine, you should wear a well-fitting mask for 10 days when around others at home and in public. If you are unable to wear a mask when around others, you should continue to quarantine for 10 days. Avoid people who are immunocompromised or at high risk for severe disease, and nursing homes and other high-risk settings, until after at least 10 days. See additional information about travel. Do not go to places where you are unable to wear a mask, such as restaurants and some gyms, and avoid eating around others at home and at work until after 10 days after your last close contact with someone with COVID-19. After quarantine Watch for symptoms until 10 days after your last close contact with someone with COVID-19. If you have symptoms, isolate immediately and get tested. Quarantine in high-risk congregate settings In certain congregate settings that have high risk of secondary transmission  (such as correctional and detention facilities, homeless shelters, or cruise ships), CDC recommends a 10-day quarantine for residents, regardless of vaccination and booster status. During periods of critical staffing shortages, facilities may consider shortening the quarantine period for staff to ensure continuity of operations. Decisions to shorten quarantine in these settings should be made in consultation with state, local, tribal, or territorial health departments and should take into consideration the context and characteristics of the facility. CDC's setting-specific guidance provides additional recommendations for these settings. Isolation Isolation is used to separate people with confirmed or suspected COVID-19 from those without COVID-19. People who are in isolation should stay   home until it's safe for them to be around others. At home, anyone sick or infected should separate from others, or wear a well-fitting mask when they need to be around others. People in isolation should stay in a specific "sick room" or area and use a separate bathroom if available. Everyone who has presumed or confirmed COVID-19 should stay home and isolate from other people for at least 5 full days (day 0 is the first day of symptoms or the date of the day of the positive viral test for asymptomatic persons). They should wear a mask when around others at home and in public for an additional 5 days. People who are confirmed to have COVID-19 or are showing symptoms of COVID-19 need to isolate regardless of their vaccination status. This includes: People who have a positive viral test for COVID-19, regardless of whether or not they have symptoms. People with symptoms of COVID-19, including people who are awaiting test results or have not been tested. People with symptoms should isolate even if they do not know if they have been in close contact with someone with COVID-19. What to do for isolation Monitor your symptoms. If you  have an emergency warning sign (including trouble breathing), seek emergency medical care immediately. Stay in a separate room from other household members, if possible. Use a separate bathroom, if possible. Take steps to improve ventilation at home, if possible. Avoid contact with other members of the household and pets. Don't share personal household items, like cups, towels, and utensils. Wear a well-fitting mask when you need to be around other people. Learn more about what to do if you are sick and how to notify your contacts. Ending isolation for people who had COVID-19 and had symptoms If you had COVID-19 and had symptoms, isolate for at least 5 days. To calculate your 5-day isolation period, day 0 is your first day of symptoms. Day 1 is the first full day after your symptoms developed. You can leave isolation after 5 full days. You can end isolation after 5 full days if you are fever-free for 24 hours without the use of fever-reducing medication and your other symptoms have improved (Loss of taste and smell may persist for weeks or months after recovery and need not delay the end of isolation). You should continue to wear a well-fitting mask around others at home and in public for 5 additional days (day 6 through day 10) after the end of your 5-day isolation period. If you are unable to wear a mask when around others, you should continue to isolate for a full 10 days. Avoid people who are immunocompromised or at high risk for severe disease, and nursing homes and other high-risk settings, until after at least 10 days. If you continue to have fever or your other symptoms have not improved after 5 days of isolation, you should wait to end your isolation until you are fever-free for 24 hours without the use of fever-reducing medication and your other symptoms have improved. Continue to wear a well-fitting mask. Contact your healthcare provider if you have questions. See additional information about  travel. Do not go to places where you are unable to wear a mask, such as restaurants and some gyms, and avoid eating around others at home and at work until a full 10 days after your first day of symptoms. If an individual has access to a test and wants to test, the best approach is to use an antigen test1 towards the end of   the 5-day isolation period. Collect the test sample only if you are fever-free for 24 hours without the use of fever-reducing medication and your other symptoms have improved (loss of taste and smell may persist for weeks or months after recovery and need not delay the end of isolation). If your test result is positive, you should continue to isolate until day 10. If your test result is negative, you can end isolation, but continue to wear a well-fitting mask around others at home and in public until day 10. Follow additional recommendations for masking and avoiding travel as described above. 1As noted in the labeling for authorized over-the counter antigen tests: Negative results should be treated as presumptive. Negative results do not rule out SARS-CoV-2 infection and should not be used as the sole basis for treatment or patient management decisions, including infection control decisions. To improve results, antigen tests should be used twice over a three-day period with at least 24 hours and no more than 48 hours between tests. Note that these recommendations on ending isolation do not apply to people with moderate or severe COVID-19 or with weakened immune systems (immunocompromised). See section below for recommendations for when toend isolation for these groups. Ending isolation for people who tested positive for COVID-19 but had no symptoms If you test positive for COVID-19 and never develop symptoms, isolate for at least 5 days. Day 0 is the day of your positive viral test (based on the date you were tested) and day 1 is the first full day after the specimen was collected for your  positive test. You can leave isolation after 5 full days. If you continue to have no symptoms, you can end isolation after at least 5 days. You should continue to wear a well-fitting mask around others at home and in public until day 10 (day 6 through day 10). If you are unable to wear a mask when around others, you should continue to isolate for 10 days. Avoid people who are immunocompromised or at high risk for severe disease, and nursing homes and other high-risk settings, until after at least 10 days. If you develop symptoms after testing positive, your 5-day isolation period should start over. Day 0 is your first day of symptoms. Follow the recommendations above for ending isolation for people who had COVID-19 and had symptoms. See additional information about travel. Do not go to places where you are unable to wear a mask, such as restaurants and some gyms, and avoid eating around others at home and at work until 10 days after the day of your positive test. If an individual has access to a test and wants to test, the best approach is to use an antigen test1 towards the end of the 5-day isolation period. If your test result is positive, you should continue to isolate until day 10. If your test result is negative, you can end isolation, but continue to wear a well-fitting mask around others at home and in public until day 10. Follow additionalrecommendations for masking and avoiding travel as described above. 1As noted in the labeling for authorized over-the counter antigen tests: Negative results should be treated as presumptive. Negative results do not rule out SARS-CoV-2 infection and should not be used as the sole basis for treatment or patient management decisions, including infection control decisions. To improve results, antigen tests should be used twice over a three-day period with at least 24 hours and no more than 48 hours between tests. Ending isolation for people who were   severely ill with  COVID-19 or have a weakened immune system (immunocompromised) People who are severely ill with COVID-19 (including those who were hospitalized or required intensive care or ventilation support) and people with compromised immune systems might need to isolate at home longer. They may also require testing with a viral test to determine when they can be around others. CDC recommends an isolation period of at least 10 and up to 20 days for people who were severely ill with COVID-19 and for people with weakened immune systems. Consult with your healthcare provider about when you can resume being aroundother people. People who are immunocompromised should talk to their healthcare provider about the potential for reduced immune responses to COVID-19 vaccines and the need to continue to follow current prevention measures (including wearing a well-fitting mask, staying 6 feet apart from others they don't live with, and avoiding crowds and poorly ventilated indoor spaces) to protect themselves against COVID-19 until advised otherwise by their healthcare provider. Close contacts of immunocompromised people--including household members--should also be encouraged to receive all recommended COVID-19 vaccine doses to help protect these people. Isolation in high-risk congregate settings In certain high-risk congregate settings that have high risk of secondary transmission and where it is not feasible to cohort people (such as correctional and detention facilities, homeless shelters, and cruise ships), CDC recommends a 10-day isolation period for residents. During periods of critical staffing shortages, facilities may consider shortening the isolation period for staff to ensure continuity of operations. Decisions to shorten isolation in these settings should be made in consultation with state, local, tribal, or territorial health departments and should take into consideration the context and characteristics of the facility.  CDC's setting-specific guidance provides additional recommendations for these settings. This CDC guidance is meant to supplement--not replace--any federal, state, local,territorial, or tribal health and safety laws, rules, and regulations. Recommendations for specific settings These recommendations do not apply to healthcare professionals. For guidance specific to these settings, see Healthcare professionals: Interim Guidance for Managing Healthcare Personnel with SARS-CoV-2 Infection or Exposure to SARS-CoV-2 Patients, residents, and visitors to healthcare settings: Interim Infection Prevention and Control Recommendations for Healthcare Personnel During the Coronavirus Disease 2019 (COVID-19) Pandemic Additional setting-specific guidance and recommendations are available. These recommendations on quarantine and isolation do apply to K-12 School settings. Additional guidance is available here: Overview of COVID-19 Quarantine for K-12 Schools Travelers: Travel information and recommendations Congregate facilities and other settings: guidance pages for community, work, and school settings Ongoing COVID-19 exposure FAQs I live with someone with COVID-19, but I cannot be separated from them. How do we manage quarantine in this situation? It is very important for people with COVID-19 to remain apart from other people, if possible, even if they are living together. If separation of the person with COVID-19 from others that they live with is not possible, the other people that they live with will have ongoing exposure, meaning they will be repeatedly exposed until that person is no longer able to spread the virus to other people. In this situation, there are precautions you can take to limit the spread of COVID-19: The person with COVID-19 and everyone they live with should wear a well-fitting mask inside the home. If possible, one person should care for the person with COVID-19 to limit the number of people  who are in close contact with the infected person. Take steps to protect yourself and others to reduce transmission in the home: Quarantine if you are not up to date with your COVID-19   vaccines. Isolate if you are sick or tested positive for COVID-19, even if you don't have symptoms. Learn more about the public health recommendations for testing, mask use and quarantine of close contacts, like yourself, who have ongoing exposure. These recommendations differ depending on your vaccination status. What should I do if I have ongoing exposure to COVID-19 from someone I live with? Recommendations for this situation depend on your vaccination status: If you are not up to date on COVID-19 vaccines and have ongoing exposure to COVID-19, you should: Begin quarantine immediately and continue to quarantine throughout the isolation period of the person with COVID-19. Continue to quarantine for an additional 5 days starting the day after the end of isolation for the person with COVID-19. Get tested at least 5 days after the end of isolation of the infected person that lives with them. If you test negative, you can leave the home but should continue to wear a well-fitting mask when around others at home and in public until 10 days after the end of isolation for the person with COVID-19. Isolate immediately if you develop symptoms of COVID-19 or test positive. If you are up to date with COVID-19 vaccines and have ongoing exposure to COVID-19, you should: Get tested at least 5 days after your first exposure. A person with COVID-19 is considered infectious starting 2 days before they develop symptoms, or 2 days before the date of their positive test if they do not have symptoms. Get tested again at least 5 days after the end of isolation for the person with COVID-19. Wear a well-fitting mask when you are around the person with COVID-19, and do this throughout their isolation period. Wear a well-fitting mask around  others for 10 days after the infected person's isolation period ends. Isolate immediately if you develop symptoms of COVID-19 or test positive. What should I do if multiple people I live with test positive for COVID-19 at different times? Recommendations for this situation depend on your vaccination status: If you are not up to date with your COVID-19 vaccines, you should: Quarantine throughout the isolation period of any infected person that you live with. Continue to quarantine until 5 days after the end of isolation date for the most recently infected person that lives with you. For example, if the last day of isolation of the person most recently infected with COVID-19 was June 30, the new 5-day quarantine period starts on July 1. Get tested at least 5 days after the end of isolation for the most recently infected person that lives with you. Wear a well-fitting mask when you are around any person with COVID-19 while that person is in isolation. Wear a well-fitting mask when you are around other people until 10 days after your last close contact. Isolate immediately if you develop symptoms of COVID-19 or test positive. If you are up to date with COVID-19 your vaccines , you should: Get tested at least 5 days after your first exposure. A person with COVID-19 is considered infectious starting 2 days before they developed symptoms, or 2 days before the date of their positive test if they do not have symptoms. Get tested again at least 5 days after the end of isolation for the most recently infected person that lives with you. Wear a well-fitting mask when you are around any person with COVID-19 while that person is in isolation. Wear a well-fitting mask around others for 10 days after the end of isolation for the most recently infected person that   lives with you. For example, if the last day of isolation for the person most recently infected with COVID-19 was June 30, the new 10-day period to wear a  well-fitting mask indoors in public starts on July 1. Isolate immediately if you develop symptoms of COVID-19 or test positive. I had COVID-19 and completed isolation. Do I have to quarantine or get tested if someone I live with gets COVID-19 shortly after I completed isolation? No. If you recently completed isolation and someone that lives with you tests positive for the virus that causes COVID-19 shortly after the end of your isolation period, you do not have to quarantine or get tested as long as you do not develop new symptoms. Once all of the people that live together have completed isolation or quarantine, refer to the guidance below for new exposures to COVID-19. If you had COVID-19 in the previous 90 days and then came into close contact with someone with COVID-19, you do not have to quarantine or get tested if you do not have symptoms. But you should: Wear a well-fitting mask indoors in public for 10 days after exposure. Monitor for COVID-19 symptoms and isolate immediately if symptoms develop. Consult with a healthcare provider for testing recommendations if new symptoms develop. If more than 90 days have passed since your recovery from infection, follow CDC's recommendations for close contacts. These recommendations will differ depending on your vaccination status. 03/06/2020 Content source: National Center for Immunization and Respiratory Diseases (NCIRD), Division of Viral Diseases This information is not intended to replace advice given to you by your health care provider. Make sure you discuss any questions you have with your healthcare provider. Document Revised: 03/14/2020 Document Reviewed: 03/14/2020 Elsevier Patient Education  2022 Elsevier Inc.  

## 2020-08-14 ENCOUNTER — Other Ambulatory Visit: Payer: BC Managed Care – PPO

## 2020-08-14 ENCOUNTER — Encounter: Payer: Self-pay | Admitting: Internal Medicine

## 2020-08-14 ENCOUNTER — Ambulatory Visit (INDEPENDENT_AMBULATORY_CARE_PROVIDER_SITE_OTHER): Payer: BC Managed Care – PPO | Admitting: Internal Medicine

## 2020-08-14 ENCOUNTER — Other Ambulatory Visit: Payer: Self-pay

## 2020-08-14 VITALS — BP 126/82 | HR 97 | Temp 98.3°F | Ht 64.6 in | Wt 215.4 lb

## 2020-08-14 DIAGNOSIS — R55 Syncope and collapse: Secondary | ICD-10-CM

## 2020-08-14 DIAGNOSIS — E1122 Type 2 diabetes mellitus with diabetic chronic kidney disease: Secondary | ICD-10-CM | POA: Diagnosis not present

## 2020-08-14 DIAGNOSIS — E2839 Other primary ovarian failure: Secondary | ICD-10-CM

## 2020-08-14 DIAGNOSIS — N182 Chronic kidney disease, stage 2 (mild): Secondary | ICD-10-CM | POA: Diagnosis not present

## 2020-08-14 DIAGNOSIS — Z6836 Body mass index (BMI) 36.0-36.9, adult: Secondary | ICD-10-CM

## 2020-08-14 DIAGNOSIS — I129 Hypertensive chronic kidney disease with stage 1 through stage 4 chronic kidney disease, or unspecified chronic kidney disease: Secondary | ICD-10-CM

## 2020-08-14 DIAGNOSIS — Z8616 Personal history of COVID-19: Secondary | ICD-10-CM

## 2020-08-14 NOTE — Progress Notes (Signed)
I,[name],acting as a Education administrator for Maximino Greenland, MD.,have documented all relevant documentation on the behalf of Maximino Greenland, MD,as directed by  Maximino Greenland, MD while in the presence of Maximino Greenland, MD.  This visit occurred during the SARS-CoV-2 public health emergency.  Safety protocols were in place, including screening questions prior to the visit, additional usage of staff PPE, and extensive cleaning of exam room while observing appropriate contact time as indicated for disinfecting solutions.  Subjective:     Patient ID: Ashley Monroe , female    DOB: Jan 19, 1956 , 65 y.o.   MRN: [id number]   Chief Complaint  Patient presents with   Diabetes   Hypertension    HPI  Patient is here for diabetes and blood pressure f/u. She reports compliance with meds. She denies headaches, chest pain and shortness of breath. She admits that she is not exercising as much as she should.    Diabetes She presents for her follow-up diabetic visit. She has type 2 diabetes mellitus. Her disease course has been stable. Hypoglycemia symptoms include dizziness. Pertinent negatives for diabetes include no blurred vision and no chest pain. There are no hypoglycemic complications. Risk factors for coronary artery disease include diabetes mellitus, dyslipidemia, hypertension, obesity and sedentary lifestyle. She is following a generally healthy diet. She participates in exercise intermittently. Her breakfast blood glucose is taken between 7-8 am. Her breakfast blood glucose range is generally 130-140 mg/dl. An ACE inhibitor/angiotensin II receptor blocker is being taken. Eye exam is current.  Hypertension This is a chronic problem. The current episode started more than 1 year ago. The problem has been gradually improving since onset. The problem is controlled. Pertinent negatives include no blurred vision, chest pain, palpitations or shortness of breath.    Past Medical History:  Diagnosis  Date   Allergy    Arthritis    RA   Carpal tunnel syndrome    Chronic kidney disease    Diabetes mellitus without complication ([place])    Essential hypertension 06/01/2019   Hyperlipidemia 08/19/2016   Hypertension    PONV (postoperative nausea and vomiting)      Family History  Problem Relation Age of Onset   Atrial fibrillation Mother    Stroke Mother    Heart attack Mother    Aneurysm Mother    Atrial fibrillation Brother    Stroke Brother    Diabetes Brother    Diabetes Father    Alzheimer's disease Father    Diabetes Brother    Alzheimer's disease Maternal Grandmother    Heart attack Maternal Grandfather    Cancer Paternal Grandmother    Healthy Son    Healthy Daughter      Current Outpatient Medications:    Accu-Chek FastClix Lancets MISC, Use as instructed to check blood sugars 2 times per day dx: E11.22, Disp: 300 each, Rfl: 2   amLODipine (NORVASC) 10 MG tablet, TAKE 1 TABLET BY MOUTH EVERY DAY, Disp: 90 tablet, Rfl: 2   aspirin EC 81 MG tablet, Take 81 mg by mouth daily., Disp: , Rfl:    benzonatate (TESSALON PERLES) 100 MG capsule, Take 1 capsule (100 mg total) by mouth every 6 (six) hours as needed., Disp: 30 capsule, Rfl: 1   cetirizine (ZYRTEC) 10 MG tablet, Take 10 mg by mouth daily., Disp: , Rfl:    cyclobenzaprine (FLEXERIL) 10 MG tablet, Take 10 mg by mouth at bedtime as needed for muscle spasms., Disp: , Rfl: 0   DULoxetine (  CYMBALTA) 30 MG capsule, TAKE 1 CAPSULE BY MOUTH EVERY DAY, Disp: 90 capsule, Rfl: 1   glucose blood (ACCU-CHEK GUIDE) test strip, Use as instructed to check blood sugars 2 times per day dx: E11.22, Disp: 300 each, Rfl: 2   insulin detemir (LEVEMIR FLEXTOUCH) 100 UNIT/ML FlexPen, Inject 10 Units into the skin at bedtime., Disp: 15 mL, Rfl: 2   Insulin Pen Needle (PEN NEEDLES) 32G X 4 MM MISC, Use as directed with Levemir, Disp: 150 each, Rfl: 2   ipratropium (ATROVENT) 0.03 % nasal spray, Place 2 sprays into both nostrils 4 (four) times  daily as needed for rhinitis., Disp: 30 mL, Rfl: 3   metFORMIN (GLUCOPHAGE) 500 MG tablet, Take 1 tablet (500 mg total) by mouth 2 (two) times daily with a meal., Disp: 180 tablet, Rfl: 2   olmesartan-hydrochlorothiazide (BENICAR HCT) 40-25 MG tablet, TAKE 1 TABLET BY MOUTH EVERY DAY, Disp: 90 tablet, Rfl: 1   oxyCODONE (OXY IR/ROXICODONE) 5 MG immediate release tablet, Take 1 tablet (5 mg total) by mouth every 4 (four) hours as needed for moderate pain ((score 4 to 6))., Disp: 40 tablet, Rfl: 0   OZEMPIC, 0.25 OR 0.5 MG/DOSE, 2 MG/1.5ML SOPN, INJECT 0.5 MG INTO THE SKIN ONCE A WEEK., Disp: 4.5 mL, Rfl: 3   rosuvastatin (CRESTOR) 20 MG tablet, TAKE 1 TABLET (20 MG TOTAL) BY MOUTH EVERY MONDAY, WEDNESDAY, AND FRIDAY., Disp: 36 tablet, Rfl: 3   Allergies  Allergen Reactions   Amoxicillin Hives   Codeine Hives     Review of Systems  Constitutional: Negative.   Eyes:  Negative for blurred vision.  Respiratory: Negative.  Negative for shortness of breath.   Cardiovascular: Negative.  Negative for chest pain and palpitations.  Gastrointestinal: Negative.   Neurological:  Positive for dizziness.       She reports having episode of dizziness when she had COVID. States she was exposed by coworker on 6/17. She then tested POS and was seen by TIMA-NP. She admits that she did not do much during her illness. However, she washed her hair on 6/20 and passed out. She reports she first felt hot, then lightheaded. She adds that she lost consciousness, not sure for how long. She does recall getting up to put housedress and then lying down. She states she did not suffer any injuries. She admits that she probably was not drinking enough fluids during he rillness. She denies having any palpitations, chest pain, nausea/vomiting or shortness of breath.  She has not had a recurrence of her sx.   Psychiatric/Behavioral: Negative.      Today's Vitals   08/14/20 0855  BP: 126/82  Pulse: 97  Temp: 98.3 F (36.8 C)   TempSrc: Oral  Weight: 215 lb 6.4 oz (97.7 kg)  Height: 5' 4.6" (1.641 m)   Body mass index is 36.29 kg/m.  Wt Readings from Last 3 Encounters:  08/14/20 215 lb 6.4 oz (97.7 kg)  07/28/20 214 lb (97.1 kg)  06/24/20 216 lb 4.8 oz (98.1 kg)    BP Readings from Last 3 Encounters:  08/14/20 126/82  07/28/20 137/82  06/24/20 112/80    Objective:  Physical Exam Vitals and nursing note reviewed.  Constitutional:      Appearance: Normal appearance. She is obese.  HENT:     Head: Normocephalic and atraumatic.     Nose:     Comments: Masked     Mouth/Throat:     Comments: Masked  Eyes:     Extraocular  Movements: Extraocular movements intact.  Cardiovascular:     Rate and Rhythm: Normal rate and regular rhythm.     Heart sounds: Normal heart sounds.  Pulmonary:     Effort: Pulmonary effort is normal.     Breath sounds: Normal breath sounds.  Musculoskeletal:     Cervical back: Normal range of motion.  Skin:    General: Skin is warm.  Neurological:     General: No focal deficit present.     Mental Status: She is alert.  Psychiatric:        Mood and Affect: Mood normal.        Behavior: Behavior normal.        Assessment And Plan:     1. Type 2 diabetes mellitus with stage 2 chronic kidney disease, without long-term current use of insulin (HCC) Comments: Chronic, I will check labs as listed below. I will adjust meds as needed.  mportance of regular exercise was discussed with the patient. - CMP14+EGFR - Hemoglobin A1c  2. Hypertensive nephropathy Comments: Chronic, well controlled. She is encouraged to follow low sodium diet.   3. Estrogen deficiency Comments: I will refer her for bone density. She is encouraged to engage in weight-bearing exercises 3-5x/week and comply with calcium/vit D supplementation.   4. Syncope, unspecified syncope type Comments: Her sx are suggestive of vasovagal syncope. Advised dizziness is possible side effect of duloxetine. She is  encouraged to let me know if her sx recur.  - CBC no Diff  5. Class 2 severe obesity due to excess calories with serious comorbidity and body mass index (BMI) of 36.0 to 36.9 in adult [facility]) Comments: She is encouraged to strive for BMI less than 30 to decrease cardiac risk. Advised to aim for at least 150 minutes of exercise per week.   6. Personal history of COVID-19 Comments: She is fully vaccinated and boosted x 1.  She is encouraged to get her 4th booster 30-90 days after COVID infection.    Patient was given opportunity to ask questions. Patient verbalized understanding of the plan and was able to repeat key elements of the plan. All questions were answered to their satisfaction.   I, Maximino Greenland, MD, have reviewed all documentation for this visit. The documentation on 08/16/20 for the exam, diagnosis, procedures, and orders are all accurate and complete.   IF YOU HAVE BEEN REFERRED TO A SPECIALIST, IT MAY TAKE 1-2 WEEKS TO SCHEDULE/PROCESS THE REFERRAL. IF YOU HAVE NOT HEARD FROM US/SPECIALIST IN TWO WEEKS, PLEASE GIVE [place] A CALL AT [phone].   THE PATIENT IS ENCOURAGED TO PRACTICE SOCIAL DISTANCING DUE TO THE COVID-19 PANDEMIC.

## 2020-08-15 LAB — CMP14+EGFR
ALT: 20 IU/L (ref 0–32)
AST: 13 IU/L (ref 0–40)
Albumin/Globulin Ratio: 1.7 (ref 1.2–2.2)
Albumin: 4.4 g/dL (ref 3.8–4.8)
Alkaline Phosphatase: 118 IU/L (ref 44–121)
BUN/Creatinine Ratio: 24 (ref 12–28)
BUN: 21 mg/dL (ref 8–27)
Bilirubin Total: 0.3 mg/dL (ref 0.0–1.2)
CO2: 22 mmol/L (ref 20–29)
Calcium: 9.9 mg/dL (ref 8.7–10.3)
Chloride: 104 mmol/L (ref 96–106)
Creatinine, Ser: 0.86 mg/dL (ref 0.57–1.00)
Globulin, Total: 2.6 g/dL (ref 1.5–4.5)
Glucose: 161 mg/dL — ABNORMAL HIGH (ref 65–99)
Potassium: 4.4 mmol/L (ref 3.5–5.2)
Sodium: 143 mmol/L (ref 134–144)
Total Protein: 7 g/dL (ref 6.0–8.5)
eGFR: 76 mL/min/{1.73_m2} (ref >59)

## 2020-08-15 LAB — CBC
Hematocrit: 42.6 % (ref 34.0–46.6)
Hemoglobin: 13.7 g/dL (ref 11.1–15.9)
MCH: 26.6 pg (ref 26.6–33.0)
MCHC: 32.2 g/dL (ref 31.5–35.7)
MCV: 83 fL (ref 79–97)
Platelets: 280 10*3/uL (ref 150–450)
RBC: 5.16 x10E6/uL (ref 3.77–5.28)
RDW: 14.1 % (ref 11.7–15.4)
WBC: 5.9 10*3/uL (ref 3.4–10.8)

## 2020-08-15 LAB — HEMOGLOBIN A1C
Est. average glucose Bld gHb Est-mCnc: 166 mg/dL
Hgb A1c MFr Bld: 7.4 % — ABNORMAL HIGH (ref 4.8–5.6)

## 2020-08-16 DIAGNOSIS — Z8616 Personal history of COVID-19: Secondary | ICD-10-CM | POA: Insufficient documentation

## 2020-08-16 DIAGNOSIS — E2839 Other primary ovarian failure: Secondary | ICD-10-CM | POA: Insufficient documentation

## 2020-08-16 DIAGNOSIS — R55 Syncope and collapse: Secondary | ICD-10-CM | POA: Insufficient documentation

## 2020-09-13 ENCOUNTER — Other Ambulatory Visit: Payer: Self-pay | Admitting: Internal Medicine

## 2020-10-11 ENCOUNTER — Other Ambulatory Visit: Payer: Self-pay | Admitting: Internal Medicine

## 2020-10-20 LAB — HM DIABETES EYE EXAM

## 2020-10-24 ENCOUNTER — Other Ambulatory Visit: Payer: Self-pay | Admitting: Nurse Practitioner

## 2020-10-24 DIAGNOSIS — R059 Cough, unspecified: Secondary | ICD-10-CM

## 2020-11-15 ENCOUNTER — Other Ambulatory Visit: Payer: Self-pay | Admitting: Nurse Practitioner

## 2020-11-15 DIAGNOSIS — R059 Cough, unspecified: Secondary | ICD-10-CM

## 2020-12-08 LAB — HM MAMMOGRAPHY

## 2020-12-08 LAB — HM DEXA SCAN: HM Dexa Scan: NORMAL

## 2020-12-11 ENCOUNTER — Encounter: Payer: Self-pay | Admitting: Internal Medicine

## 2020-12-11 ENCOUNTER — Telehealth: Payer: Self-pay

## 2020-12-11 NOTE — Telephone Encounter (Signed)
I left the pt a message that Dr Allyne Gee said her bone density results are normal and to have a f/u bone density in 2 years.

## 2020-12-13 ENCOUNTER — Other Ambulatory Visit: Payer: Self-pay | Admitting: Nurse Practitioner

## 2020-12-13 ENCOUNTER — Other Ambulatory Visit: Payer: Self-pay | Admitting: Internal Medicine

## 2020-12-13 DIAGNOSIS — R059 Cough, unspecified: Secondary | ICD-10-CM

## 2020-12-17 ENCOUNTER — Encounter: Payer: Self-pay | Admitting: Internal Medicine

## 2020-12-18 ENCOUNTER — Other Ambulatory Visit: Payer: Self-pay | Admitting: Internal Medicine

## 2021-01-08 ENCOUNTER — Other Ambulatory Visit: Payer: Self-pay | Admitting: Nurse Practitioner

## 2021-01-08 DIAGNOSIS — R059 Cough, unspecified: Secondary | ICD-10-CM

## 2021-01-13 ENCOUNTER — Other Ambulatory Visit: Payer: Self-pay | Admitting: Internal Medicine

## 2021-01-16 IMAGING — RF DG CERVICAL SPINE 2 OR 3 VIEWS
1 series · 1 of 1 positions shown · non-contrast
Comparison: 06/26/2018

CLINICAL DATA: Cervical fusion

EXAM:
CERVICAL SPINE - 2-3 VIEW; DG C-ARM 1-60 MIN

[Series 1: unknown protocol · 0.14mm/px · 1 of 1 slices shown]
[im 1/1]
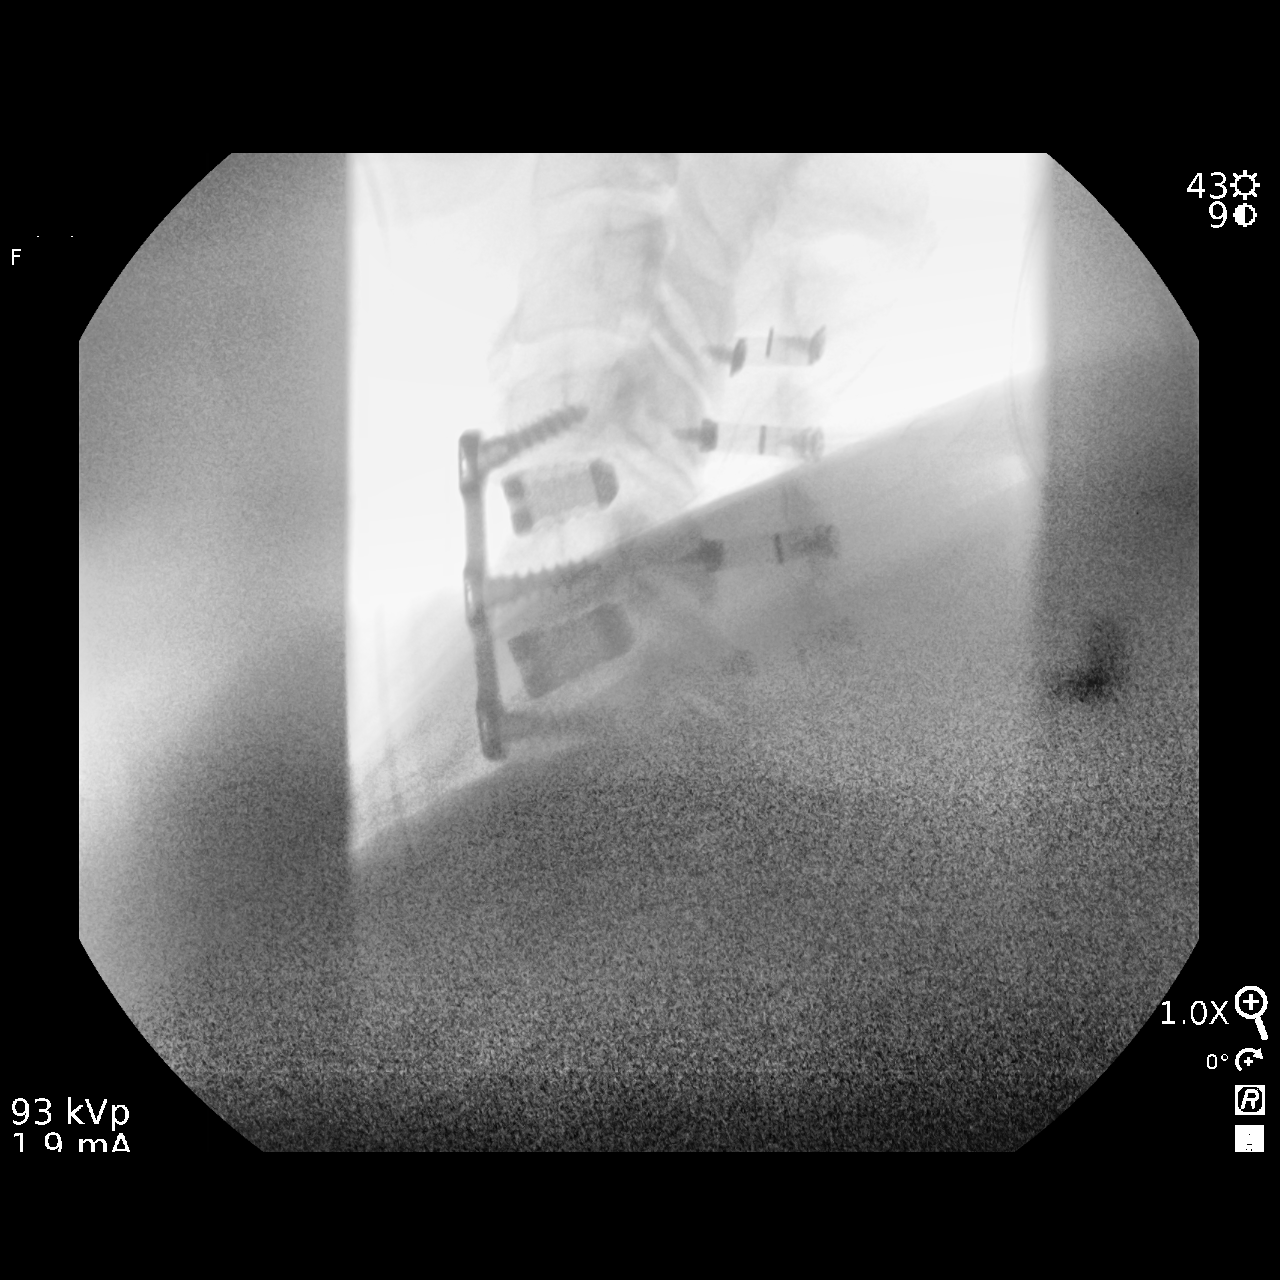

[1 of 1 positions shown; findings below may reference images not displayed]

FLUOROSCOPY TIME:  Fluoroscopy Time:  10 seconds

Radiation Exposure Index (if provided by the fluoroscopic device):
1.84 mGy

Number of Acquired Spot Images: 1
FINDINGS: Completion film shows interbody fusion at C4-5 and C5-6 with
anterior fixation. Previously placed posterior fixation elements are
noted related to wide multilevel laminectomy.
IMPRESSION: Anterior fusion at C4-5 and C5-6

## 2021-01-17 ENCOUNTER — Other Ambulatory Visit: Payer: Self-pay | Admitting: Internal Medicine

## 2021-01-19 ENCOUNTER — Encounter: Payer: BC Managed Care – PPO | Admitting: Internal Medicine

## 2021-01-19 NOTE — Progress Notes (Deleted)
I,Keean Wilmeth,acting as a Education administrator for Maximino Greenland, MD.,have documented all relevant documentation on the behalf of Maximino Greenland, MD,as directed by  Maximino Greenland, MD while in the presence of Maximino Greenland, MD.  This visit occurred during the SARS-CoV-2 public health emergency.  Safety protocols were in place, including screening questions prior to the visit, additional usage of staff PPE, and extensive cleaning of exam room while observing appropriate contact time as indicated for disinfecting solutions.  Subjective:     Patient ID: Ashley Monroe , female    DOB: 1955/03/04 , 65 y.o.   MRN: [id number]   Chief Complaint  Patient presents with   Annual Exam    HPI  She is here today for a full physical examination.  She is followed by Dr. Garwin Brothers for her Gyn care. She had a pelvic exam on 11/26/2019 and mammogram on 11/20/2019. She is  s/p hysterectomy.   Diabetes She presents for her follow-up diabetic visit. She has type 2 diabetes mellitus. Her disease course has been stable. There are no hypoglycemic associated symptoms. Pertinent negatives for diabetes include no blurred vision and no chest pain. There are no hypoglycemic complications. Risk factors for coronary artery disease include diabetes mellitus, dyslipidemia, hypertension, obesity and sedentary lifestyle. She is following a generally healthy diet. She never participates in exercise. Her breakfast blood glucose is taken between 7-8 am. Her breakfast blood glucose range is generally 130-140 mg/dl. An ACE inhibitor/angiotensin II receptor blocker is being taken. Eye exam is current.  Hypertension This is a chronic problem. The current episode started more than 1 year ago. The problem has been gradually improving since onset. The problem is controlled. Pertinent negatives include no blurred vision, chest pain, palpitations or shortness of breath. Risk factors for coronary artery disease include obesity, post-menopausal  state, sedentary lifestyle, dyslipidemia and diabetes mellitus. Past treatments include angiotensin blockers.    Past Medical History:  Diagnosis Date   Allergy    Arthritis    RA   Carpal tunnel syndrome    Chronic kidney disease    Diabetes mellitus without complication ([place])    Essential hypertension 06/01/2019   Hyperlipidemia 08/19/2016   Hypertension    PONV (postoperative nausea and vomiting)      Family History  Problem Relation Age of Onset   Atrial fibrillation Mother    Stroke Mother    Heart attack Mother    Aneurysm Mother    Atrial fibrillation Brother    Stroke Brother    Diabetes Brother    Diabetes Father    Alzheimer's disease Father    Diabetes Brother    Alzheimer's disease Maternal Grandmother    Heart attack Maternal Grandfather    Cancer Paternal Grandmother    Healthy Son    Healthy Daughter      Current Outpatient Medications:    Accu-Chek FastClix Lancets MISC, Use as instructed to check blood sugars 2 times per day dx: E11.22, Disp: 300 each, Rfl: 2   amLODipine (NORVASC) 10 MG tablet, TAKE 1 TABLET BY MOUTH EVERY DAY, Disp: 90 tablet, Rfl: 2   aspirin EC 81 MG tablet, Take 81 mg by mouth daily., Disp: , Rfl:    benzonatate (TESSALON PERLES) 100 MG capsule, Take 1 capsule (100 mg total) by mouth every 6 (six) hours as needed., Disp: 30 capsule, Rfl: 1   cetirizine (ZYRTEC) 10 MG tablet, Take 10 mg by mouth daily., Disp: , Rfl:    cyclobenzaprine (FLEXERIL) 10  MG tablet, Take 10 mg by mouth at bedtime as needed for muscle spasms., Disp: , Rfl: 0   DULoxetine (CYMBALTA) 30 MG capsule, TAKE 1 CAPSULE BY MOUTH EVERY DAY, Disp: 90 capsule, Rfl: 1   glucose blood (ACCU-CHEK GUIDE) test strip, USE AS INSTRUCTED TO CHECK BLOOD SUGARS 2 TIMES PER DAY, Disp: 200 strip, Rfl: 4   Insulin Pen Needle (PEN NEEDLES) 32G X 4 MM MISC, Use as directed with Levemir, Disp: 150 each, Rfl: 2   ipratropium (ATROVENT) 0.03 % nasal spray, PLACE 2 SPRAYS INTO BOTH NOSTRILS  4 (FOUR) TIMES DAILY AS NEEDED FOR RHINITIS., Disp: 30 mL, Rfl: 1   LEVEMIR FLEXTOUCH 100 UNIT/ML FlexTouch Pen, INJECT 10 UNITS SUBCUTANEOUS DAILY AT BEDTIME, Disp: 15 mL, Rfl: 1   metFORMIN (GLUCOPHAGE) 500 MG tablet, TAKE 1 TABLET BY MOUTH 2 TIMES DAILY WITH A MEAL., Disp: 180 tablet, Rfl: 2   olmesartan-hydrochlorothiazide (BENICAR HCT) 40-25 MG tablet, TAKE 1 TABLET BY MOUTH EVERY DAY, Disp: 90 tablet, Rfl: 1   oxyCODONE (OXY IR/ROXICODONE) 5 MG immediate release tablet, Take 1 tablet (5 mg total) by mouth every 4 (four) hours as needed for moderate pain ((score 4 to 6))., Disp: 40 tablet, Rfl: 0   OZEMPIC, 0.25 OR 0.5 MG/DOSE, 2 MG/1.5ML SOPN, INJECT 0.5 MG INTO THE SKIN ONCE A WEEK., Disp: 9 mL, Rfl: 3   rosuvastatin (CRESTOR) 20 MG tablet, TAKE 1 TABLET (20 MG TOTAL) BY MOUTH EVERY MONDAY, WEDNESDAY, AND FRIDAY., Disp: 36 tablet, Rfl: 3   Allergies  Allergen Reactions   Amoxicillin Hives   Codeine Hives      The patient states she uses {contraceptive methods:5051} for birth control. Last LMP was No LMP recorded. Patient has had a hysterectomy.. {Dysmenorrhea-menorrhagia:21918}. Negative for: breast discharge, breast lump(s), breast pain and breast self exam. Associated symptoms include abnormal vaginal bleeding. Pertinent negatives include abnormal bleeding (hematology), anxiety, decreased libido, depression, difficulty falling sleep, dyspareunia, history of infertility, nocturia, sexual dysfunction, sleep disturbances, urinary incontinence, urinary urgency, vaginal discharge and vaginal itching. Diet regular.The patient states her exercise level is    . The patient's tobacco use is:  Social History   Tobacco Use  Smoking Status Never  Smokeless Tobacco Never  . She has been exposed to passive smoke. The patient's alcohol use is:  Social History   Substance and Sexual Activity  Alcohol Use Never   Alcohol/week: 0.0 standard drinks  . Additional information: Last pap ***, next one  scheduled for ***.    Review of Systems  Eyes:  Negative for blurred vision.  Respiratory:  Negative for shortness of breath.   Cardiovascular:  Negative for chest pain and palpitations.    There were no vitals filed for this visit. There is no height or weight on file to calculate BMI.   Objective:  Physical Exam      Assessment And Plan:     1. Routine general medical examination at health care facility  2. Type 2 diabetes mellitus with stage 2 chronic kidney disease, without long-term current use of insulin (HCC)  3. Hypertensive nephropathy  4. Class 2 severe obesity due to excess calories with serious comorbidity and body mass index (BMI) of 36.0 to 36.9 in adult [facility])    Patient was given opportunity to ask questions. Patient verbalized understanding of the plan and was able to repeat key elements of the plan. All questions were answered to their satisfaction.   Mariam Dollar, CMA   I, Mariam Dollar, CMA, have  reviewed all documentation for this visit. The documentation on 01/19/21 for the exam, diagnosis, procedures, and orders are all accurate and complete.  THE PATIENT IS ENCOURAGED TO PRACTICE SOCIAL DISTANCING DUE TO THE COVID-19 PANDEMIC.

## 2021-01-20 ENCOUNTER — Encounter: Payer: Self-pay | Admitting: Internal Medicine

## 2021-01-20 ENCOUNTER — Other Ambulatory Visit: Payer: Self-pay

## 2021-01-20 ENCOUNTER — Ambulatory Visit (INDEPENDENT_AMBULATORY_CARE_PROVIDER_SITE_OTHER): Payer: BC Managed Care – PPO | Admitting: Internal Medicine

## 2021-01-20 VITALS — BP 114/72 | HR 81 | Temp 97.9°F | Ht 64.4 in | Wt 212.4 lb

## 2021-01-20 DIAGNOSIS — Z0000 Encounter for general adult medical examination without abnormal findings: Secondary | ICD-10-CM

## 2021-01-20 DIAGNOSIS — I129 Hypertensive chronic kidney disease with stage 1 through stage 4 chronic kidney disease, or unspecified chronic kidney disease: Secondary | ICD-10-CM | POA: Diagnosis not present

## 2021-01-20 DIAGNOSIS — E1122 Type 2 diabetes mellitus with diabetic chronic kidney disease: Secondary | ICD-10-CM

## 2021-01-20 DIAGNOSIS — N182 Chronic kidney disease, stage 2 (mild): Secondary | ICD-10-CM | POA: Diagnosis not present

## 2021-01-20 DIAGNOSIS — M0579 Rheumatoid arthritis with rheumatoid factor of multiple sites without organ or systems involvement: Secondary | ICD-10-CM | POA: Diagnosis not present

## 2021-01-20 DIAGNOSIS — Z79899 Other long term (current) drug therapy: Secondary | ICD-10-CM

## 2021-01-20 DIAGNOSIS — Z23 Encounter for immunization: Secondary | ICD-10-CM

## 2021-01-20 DIAGNOSIS — G9589 Other specified diseases of spinal cord: Secondary | ICD-10-CM

## 2021-01-20 DIAGNOSIS — M25561 Pain in right knee: Secondary | ICD-10-CM

## 2021-01-20 DIAGNOSIS — Z6836 Body mass index (BMI) 36.0-36.9, adult: Secondary | ICD-10-CM

## 2021-01-20 LAB — POCT UA - MICROALBUMIN
Albumin/Creatinine Ratio, Urine, POC: 30
Creatinine, POC: 300 mg/dL
Microalbumin Ur, POC: 10 mg/L

## 2021-01-20 LAB — POCT URINALYSIS DIPSTICK
Bilirubin, UA: NEGATIVE
Blood, UA: NEGATIVE
Glucose, UA: NEGATIVE
Ketones, UA: NEGATIVE
Nitrite, UA: NEGATIVE
Protein, UA: NEGATIVE
Spec Grav, UA: 1.02 (ref 1.010–1.025)
Urobilinogen, UA: 0.2 E.U./dL
pH, UA: 5 (ref 5.0–8.0)

## 2021-01-20 NOTE — Patient Instructions (Signed)

## 2021-01-20 NOTE — Progress Notes (Signed)
**Note Ashley-Identified via Obfuscation** Ashley Monroe,acting as a Education administrator for Ashley Greenland, MD.,have documented all relevant documentation on the behalf of Ashley Greenland, MD,as directed by  Ashley Greenland, MD while in the presence of Ashley Greenland, MD.  This visit occurred during the SARS-CoV-2 public health emergency.  Safety protocols were in place, including screening questions prior to the visit, additional usage of staff PPE, and extensive cleaning of exam room while observing appropriate contact time as indicated for disinfecting solutions.  Subjective:     Patient ID: [name] , female    DOB: 03-May-1955 , 65 y.o.   MRN: [id number]   Chief Complaint  Patient presents with   Annual Exam   Diabetes   Hypertension    HPI  She is here today for a full physical examination.  She is followed by Dr. Garwin Monroe for her Gyn care. She had her last mammogram on 12/08/2020. She reports compliance with meds. She denies headaches, chest pain and shortness of breath.   Diabetes She presents for her follow-up diabetic visit. She has type 2 diabetes mellitus. Her disease course has been stable. There are no hypoglycemic associated symptoms. Pertinent negatives for diabetes include no blurred vision and no chest pain. There are no hypoglycemic complications. Risk factors for coronary artery disease include diabetes mellitus, dyslipidemia, hypertension, obesity and sedentary lifestyle. She is following a generally healthy diet. She never participates in exercise. Her breakfast blood glucose is taken between 7-8 am. Her breakfast blood glucose range is generally 130-140 mg/dl. An ACE inhibitor/angiotensin II receptor blocker is being taken. Eye exam is current.  Hypertension This is a chronic problem. The current episode started more than 1 year ago. The problem has been gradually improving since onset. The problem is controlled. Pertinent negatives include no blurred vision, chest pain, palpitations or shortness of breath.  Risk factors for coronary artery disease include obesity, post-menopausal state, sedentary lifestyle, dyslipidemia and diabetes mellitus. Past treatments include angiotensin blockers.    Past Medical History:  Diagnosis Date   Allergy    Arthritis    RA   Carpal tunnel syndrome    Chronic kidney disease    Diabetes mellitus without complication ([place])    Essential hypertension 06/01/2019   Hyperlipidemia 08/19/2016   Hypertension    PONV (postoperative nausea and vomiting)      Family History  Problem Relation Age of Onset   Atrial fibrillation Mother    Stroke Mother    Heart attack Mother    Aneurysm Mother    Atrial fibrillation Brother    Stroke Brother    Diabetes Brother    Diabetes Father    Alzheimer's disease Father    Diabetes Brother    Alzheimer's disease Maternal Grandmother    Heart attack Maternal Grandfather    Cancer Paternal Grandmother    Healthy Son    Healthy Daughter      Current Outpatient Medications:    Accu-Chek FastClix Lancets MISC, Use as instructed to check blood sugars 2 times per day dx: E11.22, Disp: 300 each, Rfl: 2   amLODipine (NORVASC) 10 MG tablet, TAKE 1 TABLET BY MOUTH EVERY DAY, Disp: 90 tablet, Rfl: 2   aspirin EC 81 MG tablet, Take 81 mg by mouth daily., Disp: , Rfl:    benzonatate (TESSALON PERLES) 100 MG capsule, Take 1 capsule (100 mg total) by mouth every 6 (six) hours as needed., Disp: 30 capsule, Rfl: 1   cetirizine (ZYRTEC) 10 MG tablet, Take 10 mg  by mouth daily., Disp: , Rfl:    cyclobenzaprine (FLEXERIL) 10 MG tablet, Take 10 mg by mouth at bedtime as needed for muscle spasms., Disp: , Rfl: 0   DULoxetine (CYMBALTA) 30 MG capsule, TAKE 1 CAPSULE BY MOUTH EVERY DAY, Disp: 90 capsule, Rfl: 1   glucose blood (ACCU-CHEK GUIDE) test strip, USE AS INSTRUCTED TO CHECK BLOOD SUGARS 2 TIMES PER DAY, Disp: 200 strip, Rfl: 4   Insulin Pen Needle (PEN NEEDLES) 32G X 4 MM MISC, Use as directed with Levemir, Disp: 150 each, Rfl: 2    ipratropium (ATROVENT) 0.03 % nasal spray, PLACE 2 SPRAYS INTO BOTH NOSTRILS 4 (FOUR) TIMES DAILY AS NEEDED FOR RHINITIS., Disp: 30 mL, Rfl: 1   LEVEMIR FLEXTOUCH 100 UNIT/ML FlexTouch Pen, INJECT 10 UNITS SUBCUTANEOUS DAILY AT BEDTIME, Disp: 15 mL, Rfl: 1   metFORMIN (GLUCOPHAGE) 500 MG tablet, TAKE 1 TABLET BY MOUTH 2 TIMES DAILY WITH A MEAL., Disp: 180 tablet, Rfl: 2   olmesartan-hydrochlorothiazide (BENICAR HCT) 40-25 MG tablet, TAKE 1 TABLET BY MOUTH EVERY DAY, Disp: 90 tablet, Rfl: 1   oxyCODONE (OXY IR/ROXICODONE) 5 MG immediate release tablet, Take 1 tablet (5 mg total) by mouth every 4 (four) hours as needed for moderate pain ((score 4 to 6))., Disp: 40 tablet, Rfl: 0   OZEMPIC, 0.25 OR 0.5 MG/DOSE, 2 MG/1.5ML SOPN, INJECT 0.5 MG INTO THE SKIN ONCE A WEEK., Disp: 9 mL, Rfl: 3   rosuvastatin (CRESTOR) 20 MG tablet, TAKE 1 TABLET (20 MG TOTAL) BY MOUTH EVERY MONDAY, WEDNESDAY, AND FRIDAY., Disp: 36 tablet, Rfl: 3   Allergies  Allergen Reactions   Amoxicillin Hives   Codeine Hives      The patient states she uses post menopausal status for birth control. Last LMP was No LMP recorded. Patient has had a hysterectomy.. Negative for Dysmenorrhea. Negative for: breast discharge, breast lump(s), breast pain and breast self exam. Associated symptoms include abnormal vaginal bleeding. Pertinent negatives include abnormal bleeding (hematology), anxiety, decreased libido, depression, difficulty falling sleep, dyspareunia, history of infertility, nocturia, sexual dysfunction, sleep disturbances, urinary incontinence, urinary urgency, vaginal discharge and vaginal itching. Diet regular.The patient states her exercise level is  minimal.   . The patient's tobacco use is:  Social History   Tobacco Use  Smoking Status Never  Smokeless Tobacco Never  . She has been exposed to passive smoke. The patient's alcohol use is:  Social History   Substance and Sexual Activity  Alcohol Use Never    Alcohol/week: 0.0 standard drinks   Review of Systems  Constitutional: Negative.   HENT: Negative.    Eyes: Negative.  Negative for blurred vision.  Respiratory: Negative.  Negative for shortness of breath.   Cardiovascular: Negative.  Negative for chest pain and palpitations.  Gastrointestinal: Negative.   Endocrine: Negative.   Genitourinary: Negative.   Musculoskeletal: Negative.   Skin: Negative.   Allergic/Immunologic: Negative.   Neurological: Negative.   Hematological: Negative.   Psychiatric/Behavioral: Negative.      Today's Vitals   01/20/21 1425  BP: 114/72  Pulse: 81  Temp: 97.9 F (36.6 C)  Weight: 212 lb 6.4 oz (96.3 kg)  Height: 5' 4.4" (1.636 m)  PainSc: 4   PainLoc: Knee   Body mass index is 36.01 kg/m.  Wt Readings from Last 3 Encounters:  01/20/21 212 lb 6.4 oz (96.3 kg)  08/14/20 215 lb 6.4 oz (97.7 kg)  07/28/20 214 lb (97.1 kg)     Objective:  Physical Exam Vitals and nursing  note reviewed.  Constitutional:      Appearance: Normal appearance.  HENT:     Head: Normocephalic and atraumatic.     Right Ear: Tympanic membrane, ear canal and external ear normal.     Left Ear: Tympanic membrane, ear canal and external ear normal.     Nose:     Comments: Masked     Mouth/Throat:     Comments: Masked  Eyes:     Extraocular Movements: Extraocular movements intact.     Conjunctiva/sclera: Conjunctivae normal.     Pupils: Pupils are equal, round, and reactive to light.  Cardiovascular:     Rate and Rhythm: Normal rate and regular rhythm.     Pulses: Normal pulses.          Dorsalis pedis pulses are 2+ on the right side and 2+ on the left side.     Heart sounds: Normal heart sounds.  Pulmonary:     Effort: Pulmonary effort is normal.     Breath sounds: Normal breath sounds.  Chest:  Breasts:    Tanner Score is 5.     Right: Normal.     Left: Normal.  Abdominal:     General: Bowel sounds are normal.     Palpations: Abdomen is soft.      Comments: Obese, soft  Genitourinary:    Comments: deferred Musculoskeletal:        General: Normal range of motion.     Cervical back: Normal range of motion and neck supple.  Feet:     Right foot:     Protective Sensation: 5 sites tested.  5 sites sensed.     Skin integrity: Dry skin present.     Toenail Condition: Right toenails are normal.     Left foot:     Protective Sensation: 5 sites tested.  5 sites sensed.     Skin integrity: Dry skin present.     Toenail Condition: Left toenails are normal.  Skin:    General: Skin is warm and dry.  Neurological:     General: No focal deficit present.     Mental Status: She is alert and oriented to person, place, and time.  Psychiatric:        Mood and Affect: Mood normal.        Behavior: Behavior normal.        Assessment And Plan:     1. Encounter for general adult medical examination w/o abnormal findings Comments: A full exam was performed. Importance of monthly self breast exams was discussed with the patient. PATIENT IS ADVISED TO GET 30-45 MINUTES REGULAR EXERCISE NO LESS THAN FOUR TO FIVE DAYS PER WEEK - BOTH WEIGHTBEARING EXERCISES AND AEROBIC ARE RECOMMENDED.  PATIENT IS ADVISED TO FOLLOW A HEALTHY DIET WITH AT LEAST SIX FRUITS/VEGGIES PER DAY, DECREASE INTAKE OF RED MEAT, AND TO INCREASE FISH INTAKE TO TWO DAYS PER WEEK.  MEATS/FISH SHOULD NOT BE FRIED, BAKED OR BROILED IS PREFERABLE.  IT IS ALSO IMPORTANT TO CUT BACK ON YOUR SUGAR INTAKE. PLEASE AVOID ANYTHING WITH ADDED SUGAR, CORN SYRUP OR OTHER SWEETENERS. IF YOU MUST USE A SWEETENER, YOU CAN TRY STEVIA. IT IS ALSO IMPORTANT TO AVOID ARTIFICIALLY SWEETENERS AND DIET BEVERAGES. LASTLY, I SUGGEST WEARING SPF 50 SUNSCREEN ON EXPOSED PARTS AND ESPECIALLY WHEN IN THE DIRECT SUNLIGHT FOR AN EXTENDED PERIOD OF TIME.  PLEASE AVOID FAST FOOD RESTAURANTS AND INCREASE YOUR WATER INTAKE.  - CMP14+EGFR - CBC - Hemoglobin A1c - Lipid panel  2.  Type 2 diabetes mellitus with stage 2  chronic kidney disease, without long-term current use of insulin ([facility]) Comments: Diabetic foot exam was performed. She will f/u in 4 months. I DISCUSSED WITH THE PATIENT AT LENGTH REGARDING THE GOALS OF GLYCEMIC CONTROL AND POSSIBLE LONG-TERM COMPLICATIONS.  I  ALSO STRESSED THE IMPORTANCE OF COMPLIANCE WITH HOME GLUCOSE MONITORING, DIETARY RESTRICTIONS INCLUDING AVOIDANCE OF SUGARY DRINKS/PROCESSED FOODS,  ALONG WITH REGULAR EXERCISE.  I  ALSO STRESSED THE IMPORTANCE OF ANNUAL EYE EXAMS, SELF FOOT CARE AND COMPLIANCE WITH OFFICE VISITS.  - POCT Urinalysis Dipstick (81002) - POCT UA - Microalbumin - Hemoglobin A1c  3. Hypertensive nephropathy Comments: Chronic, well controlled. No med changes. EKG performed, NSR w/o acute changes. Advised to follow low sodium diet. She will f/u in 4 months for re-evaluation.  - POCT Urinalysis Dipstick (81002) - POCT UA - Microalbumin - EKG 12-Lead - CMP14+EGFR  4. Rheumatoid arthritis involving multiple sites with positive rheumatoid factor (HCC) Comments: Chronic, now followed by Rheum. She is encouraged to follow anti-inflammatory diet - free of processed foods and refined sugars.   5. Class 2 severe obesity due to excess calories with serious comorbidity and body mass index (BMI) of 36.0 to 36.9 in adult [facility]) Comments: She is encouraged to strive for BMI less than 30 to decrease cardiac risk. Advised to gradually incorporate more exercise into her daily routine, aiming for 150 minutes per week.   6. Encounter for long-term (current) use of medications  Patient was given opportunity to ask questions. Patient verbalized understanding of the plan and was able to repeat key elements of the plan. All questions were answered to their satisfaction.   I, Ashley Greenland, MD, have reviewed all documentation for this visit. The documentation on 01/21/21 for the exam, diagnosis, procedures, and orders are all accurate and complete.   THE PATIENT IS ENCOURAGED TO  PRACTICE SOCIAL DISTANCING DUE TO THE COVID-19 PANDEMIC.

## 2021-01-21 DIAGNOSIS — M25561 Pain in right knee: Secondary | ICD-10-CM | POA: Insufficient documentation

## 2021-01-21 LAB — CMP14+EGFR
ALT: 52 IU/L — ABNORMAL HIGH (ref 0–32)
AST: 29 IU/L (ref 0–40)
Albumin/Globulin Ratio: 1.9 (ref 1.2–2.2)
Albumin: 4.8 g/dL (ref 3.8–4.8)
Alkaline Phosphatase: 149 IU/L — ABNORMAL HIGH (ref 44–121)
BUN/Creatinine Ratio: 18 (ref 12–28)
BUN: 18 mg/dL (ref 8–27)
Bilirubin Total: 0.5 mg/dL (ref 0.0–1.2)
CO2: 28 mmol/L (ref 20–29)
Calcium: 10.2 mg/dL (ref 8.7–10.3)
Chloride: 99 mmol/L (ref 96–106)
Creatinine, Ser: 0.99 mg/dL (ref 0.57–1.00)
Globulin, Total: 2.5 g/dL (ref 1.5–4.5)
Glucose: 96 mg/dL (ref 70–99)
Potassium: 4.2 mmol/L (ref 3.5–5.2)
Sodium: 141 mmol/L (ref 134–144)
Total Protein: 7.3 g/dL (ref 6.0–8.5)
eGFR: 64 mL/min/{1.73_m2} (ref >59)

## 2021-01-21 LAB — LIPID PANEL
Chol/HDL Ratio: 3 ratio (ref 0.0–4.4)
Cholesterol, Total: 165 mg/dL (ref 100–199)
HDL: 55 mg/dL (ref >39)
LDL Chol Calc (NIH): 93 mg/dL (ref 0–99)
Triglycerides: 91 mg/dL (ref 0–149)
VLDL Cholesterol Cal: 17 mg/dL (ref 5–40)

## 2021-01-21 LAB — CBC
Hematocrit: 45.2 % (ref 34.0–46.6)
Hemoglobin: 14.6 g/dL (ref 11.1–15.9)
MCH: 26.6 pg (ref 26.6–33.0)
MCHC: 32.3 g/dL (ref 31.5–35.7)
MCV: 83 fL (ref 79–97)
Platelets: 314 10*3/uL (ref 150–450)
RBC: 5.48 x10E6/uL — ABNORMAL HIGH (ref 3.77–5.28)
RDW: 14.4 % (ref 11.7–15.4)
WBC: 6.6 10*3/uL (ref 3.4–10.8)

## 2021-01-21 LAB — HEMOGLOBIN A1C
Est. average glucose Bld gHb Est-mCnc: 169 mg/dL
Hgb A1c MFr Bld: 7.5 % — ABNORMAL HIGH (ref 4.8–5.6)

## 2021-01-22 ENCOUNTER — Other Ambulatory Visit: Payer: Self-pay

## 2021-01-22 MED ORDER — ROSUVASTATIN CALCIUM 20 MG PO TABS: ORAL_TABLET | ORAL | 2 refills | Status: DC

## 2021-01-27 ENCOUNTER — Other Ambulatory Visit: Payer: Self-pay | Admitting: Internal Medicine

## 2021-01-31 NOTE — Progress Notes (Signed)
No show

## 2021-02-02 ENCOUNTER — Other Ambulatory Visit: Payer: Self-pay | Admitting: Internal Medicine

## 2021-02-12 ENCOUNTER — Other Ambulatory Visit: Payer: Self-pay | Admitting: Nurse Practitioner

## 2021-02-12 DIAGNOSIS — R059 Cough, unspecified: Secondary | ICD-10-CM

## 2021-03-05 ENCOUNTER — Other Ambulatory Visit: Payer: Self-pay

## 2021-03-05 ENCOUNTER — Encounter: Payer: Self-pay | Admitting: Internal Medicine

## 2021-03-05 ENCOUNTER — Other Ambulatory Visit (INDEPENDENT_AMBULATORY_CARE_PROVIDER_SITE_OTHER): Payer: BC Managed Care – PPO

## 2021-03-05 DIAGNOSIS — E781 Pure hyperglyceridemia: Secondary | ICD-10-CM

## 2021-03-06 LAB — LIPID PANEL
Chol/HDL Ratio: 2.8 ratio (ref 0.0–4.4)
Cholesterol, Total: 127 mg/dL (ref 100–199)
HDL: 46 mg/dL (ref >39)
LDL Chol Calc (NIH): 65 mg/dL (ref 0–99)
Triglycerides: 82 mg/dL (ref 0–149)
VLDL Cholesterol Cal: 16 mg/dL (ref 5–40)

## 2021-03-07 LAB — SPECIMEN STATUS REPORT

## 2021-03-07 LAB — ALT: ALT: 21 IU/L (ref 0–32)

## 2021-03-13 ENCOUNTER — Other Ambulatory Visit: Payer: Self-pay | Admitting: Internal Medicine

## 2021-04-01 ENCOUNTER — Other Ambulatory Visit: Payer: Self-pay | Admitting: Nurse Practitioner

## 2021-04-01 DIAGNOSIS — R059 Cough, unspecified: Secondary | ICD-10-CM

## 2021-04-28 ENCOUNTER — Other Ambulatory Visit: Payer: Self-pay | Admitting: Nurse Practitioner

## 2021-04-28 DIAGNOSIS — R059 Cough, unspecified: Secondary | ICD-10-CM

## 2021-05-21 ENCOUNTER — Other Ambulatory Visit: Payer: Self-pay | Admitting: Nurse Practitioner

## 2021-05-21 DIAGNOSIS — R059 Cough, unspecified: Secondary | ICD-10-CM

## 2021-05-27 ENCOUNTER — Ambulatory Visit: Payer: BC Managed Care – PPO | Admitting: Internal Medicine

## 2021-06-04 ENCOUNTER — Ambulatory Visit (INDEPENDENT_AMBULATORY_CARE_PROVIDER_SITE_OTHER): Payer: BC Managed Care – PPO | Admitting: Internal Medicine

## 2021-06-04 ENCOUNTER — Encounter: Payer: Self-pay | Admitting: Internal Medicine

## 2021-06-04 VITALS — BP 120/76 | HR 89 | Temp 97.9°F | Ht 64.4 in | Wt 215.4 lb

## 2021-06-04 DIAGNOSIS — M0579 Rheumatoid arthritis with rheumatoid factor of multiple sites without organ or systems involvement: Secondary | ICD-10-CM | POA: Diagnosis not present

## 2021-06-04 DIAGNOSIS — M791 Myalgia, unspecified site: Secondary | ICD-10-CM

## 2021-06-04 DIAGNOSIS — I129 Hypertensive chronic kidney disease with stage 1 through stage 4 chronic kidney disease, or unspecified chronic kidney disease: Secondary | ICD-10-CM | POA: Diagnosis not present

## 2021-06-04 DIAGNOSIS — E1122 Type 2 diabetes mellitus with diabetic chronic kidney disease: Secondary | ICD-10-CM | POA: Diagnosis not present

## 2021-06-04 DIAGNOSIS — Z6836 Body mass index (BMI) 36.0-36.9, adult: Secondary | ICD-10-CM

## 2021-06-04 DIAGNOSIS — N182 Chronic kidney disease, stage 2 (mild): Secondary | ICD-10-CM | POA: Diagnosis not present

## 2021-06-04 DIAGNOSIS — M25521 Pain in right elbow: Secondary | ICD-10-CM

## 2021-06-04 MED ORDER — PREDNISONE 5 MG (48) PO TBPK: ORAL_TABLET | ORAL | 0 refills | Status: DC

## 2021-06-04 MED ORDER — HYDROCODONE-ACETAMINOPHEN 5-325 MG PO TABS: 1.0000 | ORAL_TABLET | Freq: Four times a day (QID) | ORAL | 0 refills | Status: DC | PRN

## 2021-06-04 MED ORDER — TRIAMCINOLONE ACETONIDE 40 MG/ML IJ SUSP
40.0000 mg | Freq: Once | INTRAMUSCULAR | Status: AC
  Administered 2021-06-04: 40 mg via INTRAMUSCULAR

## 2021-06-04 NOTE — Patient Instructions (Signed)

## 2021-06-04 NOTE — Progress Notes (Signed)
?Rich Brave [place],acting as a Education administrator for Maximino Greenland, MD.,have documented all relevant documentation on the behalf of Maximino Greenland, MD,as directed by  Maximino Greenland, MD while in the presence of Maximino Greenland, MD.  ?This visit occurred during the SARS-CoV-2 public health emergency.  Safety protocols were in place, including screening questions prior to the visit, additional usage of staff PPE, and extensive cleaning of exam room while observing appropriate contact time as indicated for disinfecting solutions. ? ?Subjective:  ?  ? Patient ID: Ashley Monroe , female    DOB: 16-May-1955 , 66 y.o.   MRN: [id number] ? ? ?Chief Complaint  ?Patient presents with  ? Hypertension  ? Diabetes  ? ? ?HPI ? ?Patient is here for diabetes and blood pressure f/u. She reports compliance with meds. She denies headaches, chest pain and shortness of breath. Patient reports she is having a lot of joint pain today. She is not sure what may have triggered her sx. She denies fall/trauma.  ? ?Hypertension ?This is a chronic problem. The current episode started more than 1 year ago. The problem has been gradually improving since onset. The problem is controlled. Pertinent negatives include no blurred vision, chest pain, palpitations or shortness of breath.  ?Diabetes ?She presents for her follow-up diabetic visit. She has type 2 diabetes mellitus. Her disease course has been stable. Pertinent negatives for hypoglycemia include no dizziness. Pertinent negatives for diabetes include no blurred vision and no chest pain. There are no hypoglycemic complications. Risk factors for coronary artery disease include diabetes mellitus, dyslipidemia, hypertension, obesity and sedentary lifestyle. She is following a generally healthy diet. She participates in exercise intermittently. Her breakfast blood glucose is taken between 7-8 am. Her breakfast blood glucose range is generally 130-140 mg/dl. An ACE inhibitor/angiotensin II receptor  blocker is being taken. Eye exam is current.   ? ?Past Medical History:  ?Diagnosis Date  ? Allergy   ? Arthritis   ? RA  ? Carpal tunnel syndrome   ? Chronic kidney disease   ? Diabetes mellitus without complication ([place])   ? Essential hypertension 06/01/2019  ? Hyperlipidemia 08/19/2016  ? Hypertension   ? PONV (postoperative nausea and vomiting)   ?  ? ?Family History  ?Problem Relation Age of Onset  ? Atrial fibrillation Mother   ? Stroke Mother   ? Heart attack Mother   ? Aneurysm Mother   ? Atrial fibrillation Brother   ? Stroke Brother   ? Diabetes Brother   ? Diabetes Father   ? Alzheimer's disease Father   ? Diabetes Brother   ? Alzheimer's disease Maternal Grandmother   ? Heart attack Maternal Grandfather   ? Cancer Paternal Grandmother   ? Healthy Son   ? Healthy Daughter   ? ? ? ?Current Outpatient Medications:  ?  Accu-Chek FastClix Lancets MISC, Use as instructed to check blood sugars 2 times per day dx: E11.22, Disp: 300 each, Rfl: 2 ?  amLODipine (NORVASC) 10 MG tablet, TAKE 1 TABLET BY MOUTH EVERY DAY, Disp: 90 tablet, Rfl: 2 ?  aspirin EC 81 MG tablet, Take 81 mg by mouth daily., Disp: , Rfl:  ?  benzonatate (TESSALON PERLES) 100 MG capsule, Take 1 capsule (100 mg total) by mouth every 6 (six) hours as needed., Disp: 30 capsule, Rfl: 1 ?  cetirizine (ZYRTEC) 10 MG tablet, Take 10 mg by mouth daily., Disp: , Rfl:  ?  cyclobenzaprine (FLEXERIL) 10 MG tablet, Take 10 mg by  mouth at bedtime as needed for muscle spasms., Disp: , Rfl: 0 ?  DULoxetine (CYMBALTA) 30 MG capsule, TAKE 1 CAPSULE BY MOUTH EVERY DAY, Disp: 90 capsule, Rfl: 1 ?  glucose blood (ACCU-CHEK GUIDE) test strip, USE AS INSTRUCTED TO CHECK BLOOD SUGARS 2 TIMES PER DAY, Disp: 200 strip, Rfl: 4 ?  HYDROcodone-acetaminophen (NORCO/VICODIN) 5-325 MG tablet, Take 1 tablet by mouth every 6 (six) hours as needed for moderate pain., Disp: 30 tablet, Rfl: 0 ?  Insulin Pen Needle (PEN NEEDLES) 32G X 4 MM MISC, Use as directed with Levemir, Disp:  150 each, Rfl: 2 ?  ipratropium (ATROVENT) 0.03 % nasal spray, SPRAY 2 SPRAYS INTO EACH NOSTRIL 4 TIMES A DAY AS NEEDED FOR RHINITIS, Disp: 30 mL, Rfl: 0 ?  LEVEMIR FLEXTOUCH 100 UNIT/ML FlexTouch Pen, INJECT 10 UNITS SUBCUTANEOUS DAILY AT BEDTIME, Disp: 15 mL, Rfl: 1 ?  metFORMIN (GLUCOPHAGE) 500 MG tablet, TAKE 1 TABLET BY MOUTH 2 TIMES DAILY WITH A MEAL., Disp: 180 tablet, Rfl: 2 ?  olmesartan-hydrochlorothiazide (BENICAR HCT) 40-25 MG tablet, TAKE 1 TABLET BY MOUTH EVERY DAY, Disp: 90 tablet, Rfl: 1 ?  OZEMPIC, 0.25 OR 0.5 MG/DOSE, 2 MG/1.5ML SOPN, INJECT 0.5 MG INTO THE SKIN ONCE A WEEK., Disp: 9 mL, Rfl: 3 ?  predniSONE (STERAPRED UNI-PAK 48 TAB) 5 MG (48) TBPK tablet, Take as directed,  Please dispense as 6 day dose pack, Disp: 21 tablet, Rfl: 0 ?  rosuvastatin (CRESTOR) 20 MG tablet, Take 1 tablet by mouth Monday - Friday, Disp: 75 tablet, Rfl: 2  ? ?Allergies  ?Allergen Reactions  ? Amoxicillin Hives  ? Codeine Hives  ?  ? ?Review of Systems  ?Constitutional: Negative.   ?Eyes:  Negative for blurred vision.  ?Respiratory: Negative.  Negative for shortness of breath.   ?Cardiovascular: Negative.  Negative for chest pain and palpitations.  ?Musculoskeletal:  Positive for arthralgias.  ?     She c/o right elbow pain. Denies fall/trauma.   ?Neurological:  Negative for dizziness.  ?Psychiatric/Behavioral: Negative.     ? ?Today's Vitals  ? 06/04/21 1428  ?BP: 120/76  ?Pulse: 89  ?Temp: 97.9 ?F (36.6 ?C)  ?Weight: 215 lb 6.4 oz (97.7 kg)  ?Height: 5' 4.4" (1.636 m)  ?PainSc: 9   ? ?Body mass index is 36.52 kg/m?.  ?Wt Readings from Last 3 Encounters:  ?06/04/21 215 lb 6.4 oz (97.7 kg)  ?01/20/21 212 lb 6.4 oz (96.3 kg)  ?08/14/20 215 lb 6.4 oz (97.7 kg)  ?  ? ?Objective:  ?Physical Exam ?Vitals and nursing note reviewed.  ?Constitutional:   ?   Appearance: Normal appearance.  ?HENT:  ?   Head: Normocephalic and atraumatic.  ?Eyes:  ?   Extraocular Movements: Extraocular movements intact.  ?Cardiovascular:  ?    Rate and Rhythm: Normal rate and regular rhythm.  ?   Heart sounds: Normal heart sounds.  ?Pulmonary:  ?   Effort: Pulmonary effort is normal.  ?   Breath sounds: Normal breath sounds.  ?Musculoskeletal:     ?   General: Tenderness present.  ?   Cervical back: Normal range of motion.  ?   Right lower leg: No edema.  ?   Left lower leg: No edema.  ?Skin: ?   General: Skin is warm.  ?Neurological:  ?   General: No focal deficit present.  ?   Mental Status: She is alert.  ?Psychiatric:     ?   Mood and Affect: Mood normal.     ?  Behavior: Behavior normal.  ?   ?Assessment And Plan:  ?   ?1. Type 2 diabetes mellitus with stage 2 chronic kidney disease, without long-term current use of insulin ([place]) ?Comments: Chronic, I will check labs as below. I will adjust meds as needed.  She will f/u in 4 months for re-evaluation.  ?- CMP14+EGFR ?- Hemoglobin A1c ? ?2. Hypertensive nephropathy ?Comments: Chronic, well controlled. NO med changes. She is encouraged to follow low sodium diet.  ? ?3. Myalgia ?Comments: I will check a CPK today. She is encouraged to stay well hydrated. May benefit from Mg supplementation.  ? ?4. Right elbow pain ?Comments: I will send rx Norco to use prn.  PDMP reviewed. Advised to get elbow sleeve to wear during the workday. ? ?5. Rheumatoid arthritis involving multiple sites with positive rheumatoid factor ([place]) ?Comments: She needs to f/u with Rheum. She is encouraged to follow anti-inflammatory diet. She was given Kenalog, 70m IM x1.  ?- Ambulatory referral to Rheumatology ?- triamcinolone acetonide (KENALOG-40) injection 40 mg ?- CK, total ?- Sed Rate (ESR) ?- CYCLIC CITRUL PEPTIDE ANTIBODY, IGG/IGA ? ?6. Class 2 severe obesity due to excess calories with serious comorbidity and body mass index (BMI) of 36.0 to 36.9 in adult ([facility] ? She is encouraged to strive for BMI less than 30 to decrease cardiac risk. Advised to aim for at least 150 minutes of exercise per week.  ? ? ?Patient was given  opportunity to ask questions. Patient verbalized understanding of the plan and was able to repeat key elements of the plan. All questions were answered to their satisfaction.  ? ?I, RMaximino Greenland MD, have

## 2021-06-05 ENCOUNTER — Telehealth: Payer: Self-pay

## 2021-06-05 LAB — CMP14+EGFR
ALT: 26 IU/L (ref 0–32)
AST: 26 IU/L (ref 0–40)
Albumin/Globulin Ratio: 1.6 (ref 1.2–2.2)
Albumin: 4.4 g/dL (ref 3.8–4.8)
Alkaline Phosphatase: 126 IU/L — ABNORMAL HIGH (ref 44–121)
BUN/Creatinine Ratio: 14 (ref 12–28)
BUN: 13 mg/dL (ref 8–27)
Bilirubin Total: 0.4 mg/dL (ref 0.0–1.2)
CO2: 26 mmol/L (ref 20–29)
Calcium: 9.9 mg/dL (ref 8.7–10.3)
Chloride: 101 mmol/L (ref 96–106)
Creatinine, Ser: 0.92 mg/dL (ref 0.57–1.00)
Globulin, Total: 2.7 g/dL (ref 1.5–4.5)
Glucose: 127 mg/dL — ABNORMAL HIGH (ref 70–99)
Potassium: 4.1 mmol/L (ref 3.5–5.2)
Sodium: 142 mmol/L (ref 134–144)
Total Protein: 7.1 g/dL (ref 6.0–8.5)
eGFR: 70 mL/min/{1.73_m2} (ref >59)

## 2021-06-05 LAB — CK: Total CK: 578 U/L (ref 32–182)

## 2021-06-05 LAB — HEMOGLOBIN A1C
Est. average glucose Bld gHb Est-mCnc: 157 mg/dL
Hgb A1c MFr Bld: 7.1 % — ABNORMAL HIGH (ref 4.8–5.6)

## 2021-06-05 LAB — SEDIMENTATION RATE: Sed Rate: 16 mm/hr (ref 0–40)

## 2021-06-05 LAB — CYCLIC CITRUL PEPTIDE ANTIBODY, IGG/IGA: Cyclic Citrullin Peptide Ab: 4 units (ref 0–19)

## 2021-06-05 NOTE — Telephone Encounter (Signed)
Called pt, provider wanted to know how she was doing after receiving an injection for muscle & joint aches. Pt reports feeling okay, the shot did help with easing pain.  ?

## 2021-06-10 ENCOUNTER — Other Ambulatory Visit: Payer: Self-pay | Admitting: Internal Medicine

## 2021-06-16 ENCOUNTER — Other Ambulatory Visit: Payer: Self-pay | Admitting: Nurse Practitioner

## 2021-06-16 ENCOUNTER — Encounter: Payer: Self-pay | Admitting: Internal Medicine

## 2021-06-16 DIAGNOSIS — R059 Cough, unspecified: Secondary | ICD-10-CM

## 2021-06-18 ENCOUNTER — Encounter: Payer: Self-pay | Admitting: Internal Medicine

## 2021-07-03 ENCOUNTER — Other Ambulatory Visit: Payer: Self-pay | Admitting: Internal Medicine

## 2021-07-15 ENCOUNTER — Other Ambulatory Visit: Payer: Self-pay | Admitting: Internal Medicine

## 2021-07-16 ENCOUNTER — Other Ambulatory Visit: Payer: Self-pay | Admitting: Nurse Practitioner

## 2021-07-16 DIAGNOSIS — R059 Cough, unspecified: Secondary | ICD-10-CM

## 2021-08-10 ENCOUNTER — Other Ambulatory Visit: Payer: Self-pay | Admitting: Internal Medicine

## 2021-08-14 NOTE — Progress Notes (Signed)
Office Visit Note  Patient: Ashley Monroe             Date of Birth: 10-16-1955           MRN: [id number]             PCP: Glendale Chard, MD Referring: Glendale Chard, MD Visit Date: 08/28/2021 Occupation: _0 @  Subjective:  Pain and swelling in multiple joints  History of Present Illness: Ashley Monroe is a 66 y.o. female with history of seropositive rheumatoid arthritis.  She returns today after her last visit in October 2020.  She states she could not come for follow-up visit that her husband had a stroke.  She has been out of hydroxychloroquine for many years.  She has been having increased pain and discomfort in her bilateral hands, bilateral elbows, right knee and bilateral feet.  She states she notices swelling in her joints when there is a flare.  She has been experiencing pain or swelling but morning stiffness, difficulty climbing stairs, getting up from the chair and using her hands.  She also complains of nocturnal pain.  She states she has been given cortisone shot by Dr. Baird Cancer for increased pain and swelling couple of months ago.  She was also given a prednisone taper on June 04, 2021.  She denies having any gout flares.  He takes colchicine only on as needed basis.  Activities of Daily Living:  Patient reports morning stiffness for 10 minutes.   Patient Reports nocturnal pain.  Difficulty dressing/grooming: Denies Difficulty climbing stairs: Reports Difficulty getting out of chair: Reports Difficulty using hands for taps, buttons, cutlery, and/or writing: Reports  Review of Systems  Constitutional:  Positive for fatigue.  HENT:  Negative for mouth dryness.   Eyes:  Negative for dryness.  Respiratory:  Negative for shortness of breath.   Cardiovascular:  Positive for swelling in legs/feet.  Gastrointestinal:  Positive for diarrhea.  Endocrine: Negative for excessive thirst.  Genitourinary:  Negative for difficulty urinating.  Musculoskeletal:   Positive for joint pain, joint pain, joint swelling, muscle weakness, morning stiffness and muscle tenderness.  Skin:  Negative for rash.  Allergic/Immunologic: Negative for susceptible to infections.  Neurological:  Positive for weakness.  Hematological:  Negative for bruising/bleeding tendency.  Psychiatric/Behavioral:  Negative for sleep disturbance.     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[place]) 06/14/2019   Essential hypertension 06/01/2019   Rheumatoid arthritis involving multiple sites with positive rheumatoid factor ([place]) 04/25/2019   Class 2 severe obesity due to excess calories with serious comorbidity and body mass index (BMI) of 36.0 to 36.9 in adult [facility]) 04/25/2019   DDD (degenerative disc disease), cervical 03/15/2019   Carpal tunnel syndrome of right wrist 12/08/2018   Cervical post-laminectomy syndrome 08/28/2018   Stenosis of cervical spine with myelopathy ([place]) 08/23/2017   Hyperlipidemia 08/19/2016   Chest pain 08/12/2016   Chronic kidney disease, stage 2 (mild) 08/12/2016   Hypertensive nephropathy 08/12/2016   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 08/12/2016   Other long term (current) drug therapy 08/12/2016   Type 2 diabetes mellitus with diabetic chronic kidney disease ([place]) 08/12/2016    Past Medical History:  Diagnosis Date   Allergy    Arthritis  RA   Carpal tunnel syndrome    Chronic kidney disease    Diabetes mellitus without complication ([place])    Essential hypertension 06/01/2019   Hyperlipidemia 08/19/2016   Hypertension    PONV (postoperative nausea and vomiting)     Family History  Problem Relation Age of Onset   Atrial fibrillation Mother    Stroke Mother    Heart attack Mother     Aneurysm Mother    Atrial fibrillation Brother    Stroke Brother    Diabetes Brother    Diabetes Father    Alzheimer's disease Father    Diabetes Brother    Alzheimer's disease Maternal Grandmother    Heart attack Maternal Grandfather    Cancer Paternal Grandmother    Healthy Son    Healthy Daughter    Past Surgical History:  Procedure Laterality Date   ABDOMINAL HYSTERECTOMY  2003   ANTERIOR CERVICAL DECOMP/DISCECTOMY FUSION N/A 07/30/2019   Procedure: Anterior Cervicql Decompression Fusion - Cervcial four-Cervical five - Cervical five-Cervical six;  Surgeon: Eustace Moore, MD;  Location: [facility];  Service: Neurosurgery;  Laterality: N/A;  [place] SURGERY  08/2017   CHOLECYSTECTOMY  1982   OVARIAN CYST REMOVAL  1981   Social History   Social History Narrative   Not on file   Immunization History  Administered Date(s) Administered   DTaP 05/02/2012   Influenza,inj,Quad PF,6+ Mos 11/10/2018, 10/22/2019, 11/07/2020   Influenza-Unspecified 11/30/2017, 11/10/2018   PFIZER(Purple Top)SARS-COV-2 Vaccination 04/14/2019, 05/05/2019, 11/24/2019   Pfizer Covid-19 Vaccine Bivalent Booster 23yr & up 11/27/2020   Zoster Recombinat (Shingrix) 12/01/2016, 11/27/2020     Objective: Vital Signs: BP (!) 176/94 (BP Location: Left Arm, Patient Position: Sitting, Cuff Size: Normal)   Pulse 89   Resp 15   Ht 5' 5" (1.651 m)   Wt 217 lb (98.4 kg)   BMI 36.11 kg/m    Physical Exam Vitals and nursing note reviewed.  Constitutional:      Appearance: She is well-developed.  HENT:     Head: Normocephalic and atraumatic.  Eyes:     Conjunctiva/sclera: Conjunctivae normal.  Cardiovascular:     Rate and Rhythm: Normal rate and regular rhythm.     Heart sounds: Normal heart sounds.  Pulmonary:     Effort: Pulmonary effort is normal.     Breath sounds: Normal breath sounds.  Abdominal:     General: Bowel sounds are normal.     Palpations: Abdomen is soft.  Musculoskeletal:      Cervical back: Normal range of motion.  Lymphadenopathy:     Cervical: No cervical adenopathy.  Skin:    General: Skin is warm and dry.     Capillary Refill: Capillary refill takes less than 2 seconds.  Neurological:     Mental Status: She is alert and oriented to person, place, and time.  Psychiatric:        Behavior: Behavior normal.      Musculoskeletal Exam: C-spine was in good range of motion.  Shoulder joints,  wrist joints, MCPs PIPs and DIPs with good range of motion.  She had mild contracture in bilateral elbows.  She had synovitis over her left second and third MCP joints.  Hip joints and knee joints in good range of motion without any warmth swelling or effusion.  She had discomfort range of motion of her knee joints.  There was no tenderness over ankles or MTPs.  No synovitis was noted over MTPs or PIPs of  her feet.  CDAI Exam: CDAI Score: 4.8  Patient Global: 5 mm; Provider Global: 3 mm Swollen: 2 ; Tender: 2  Joint Exam 08/28/2021      Right  Left  MCP 2     Swollen Tender  MCP 3     Swollen Tender     Investigation: No additional findings.  Imaging: No results found.  Recent Labs: Lab Results  Component Value Date   WBC 6.6 01/20/2021   HGB 14.6 01/20/2021   PLT 314 01/20/2021   NA 142 06/04/2021   K 4.1 06/04/2021   CL 101 06/04/2021   CO2 26 06/04/2021   GLUCOSE 127 (H) 06/04/2021   BUN 13 06/04/2021   CREATININE 0.92 06/04/2021   BILITOT 0.4 06/04/2021   ALKPHOS 126 (H) 06/04/2021   AST 26 06/04/2021   ALT 26 06/04/2021   PROT 7.1 06/04/2021   ALBUMIN 4.4 06/04/2021   CALCIUM 9.9 06/04/2021   GFRAA 75 03/26/2020   QFTBGOLDPLUS NEGATIVE 07/18/2018    Speciality Comments: PLQ eye exam: 10/17/2019 normal. [facility]. Follow up in 1 year.  Procedures:  No procedures performed Allergies: Amoxicillin and Codeine   Assessment / Plan:     Visit Diagnoses: Rheumatoid arthritis involving multiple sites with positive rheumatoid factor  (HCC) - Positive RF, negative anti-CCP, -_0 eta, ESR 57.  X-ray showed MCP narrowing.  -Patient was evaluated last in the office in October 2020.  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  She has been having increased pain and discomfort and intermittent swelling in her joints.  She has had prednisone taper by Dr. Baird Cancer and also intramuscular cortisone injections.  Her last prednisone taper was in April.  She continues to have pain and discomfort in her bilateral elbows, bilateral hands, knees and her feet.  She had synovitis in her left hand.  She states the swelling moves around.  She had done well on hydroxychloroquine in the past.  She wants to restart on hydroxychloroquine.  I will send the prescription for hydroxychloroquine starting at 200 mg p.o. twice daily.  Indications side effects contraindications of Plaquenil were reviewed.  Handout was given and consent was taken.  She was advised to get a baseline eye examination.  Plan: Sedimentation rate, Rheumatoid factor, Cyclic citrul peptide antibody, IgG  Patient was counseled on the purpose, proper use, and adverse effects of hydroxychloroquine including nausea/diarrhea, skin rash, headaches, and sun sensitivity.  Advised patient to wear sunscreen once starting hydroxychloroquine to reduce risk of rash associated with sun sensitivity.  Discussed importance of annual eye exams while on hydroxychloroquine to monitor to ocular toxicity and discussed importance of frequent laboratory monitoring.  Provided patient with eye exam form for baseline ophthalmologic exam.  Reviewed risk for QTC prolongation when used in combination with other QTc prolonging agents (including but not limited to antiarrhythmics, macrolide antibiotics, flouroquinolones, tricyclic antidepressants, citalopram, specific antipsychotics, ondansetron, migraine triptans, and methadone). Provided patient with educational materials on  hydroxychloroquine and answered all questions.  Patient consented to hydroxychloroquine. Will upload consent in the media tab.     High risk medication use - Plaquenil 200 mg 1 tablet twice daily in July 2020.  I will send the prescription again.  PLQ eye exam: 10/17/2019  - Plan: CBC with Differential/Platelet, COMPLETE METABOLIC PANEL WITH GFR today and then every 5 months to monitor for drug toxicity.  Pain in both hands -she complains of pain and discomfort  in the bilateral hands with intermittent swelling.  Synovitis was noted over her left hand as described above.  Plan: XR Hand 2 View Right, XR Hand 2 View Left.  X-rays of bilateral hands were consistent with osteoarthritis.  Contracture of elbow, unspecified laterality-she has mild contractures in her bilateral elbows which are unchanged.  No synovitis was noted.  Primary osteoarthritis of both knees-she complains of pain and discomfort in her bilateral knee joints.  No warmth swelling or effusion was noted.  Pain in both feet -she complains of discomfort in her bilateral feet.  Osteoarthritic changes were noted.  No synovitis was noted.  Plan: XR Foot 2 Views Right, XR Foot 2 Views Left.  X-rays of bilateral feet were consistent with osteoarthritis.  Primary osteoarthritis of both feet  DDD (degenerative disc disease), lumbar-she has chronic lower back pain.  DDD (degenerative disc disease), cervical - She is followed by Dr. Nelva Bush.   Idiopathic chronic gout of multiple sites without tophus - She takes colchicine 0.6 mg 1 capsule by mouth daily as needed during a flare.  Patient denies any gout flares.  We will get uric acid level today.  Other medical problems are listed as follows:  Stage 2 chronic kidney disease  History of type 2 diabetes mellitus  History of hyperlipidemia  Myalgia -her CK was 578 on June 04, 2001.  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  Plan:  CK, Aldolase  Orders: Orders Placed This Encounter  Procedures   XR Hand 2 View Right   XR Hand 2 View Left   XR Foot 2 Views Right   XR Foot 2 Views Left   CBC with Differential/Platelet   COMPLETE METABOLIC PANEL WITH GFR   CK   Sedimentation rate   Rheumatoid factor   Cyclic citrul peptide antibody, IgG   Aldolase   Uric acid   Meds ordered this encounter  Medications   hydroxychloroquine (PLAQUENIL) 200 MG tablet    Sig: Take 1 tablet (200 mg total) by mouth 2 (two) times daily.    Dispense:  180 tablet    Refill:  0     Follow-Up Instructions: Return in about 6 weeks (around 10/09/2021) for Rheumatoid arthritis.   Bo Merino, MD  Note - This record has been created using Editor, commissioning.  Chart creation errors have been sought, but may not always  have been located. Such creation errors do not reflect on  the standard of medical care.

## 2021-08-26 ENCOUNTER — Other Ambulatory Visit: Payer: Self-pay | Admitting: Nurse Practitioner

## 2021-08-26 DIAGNOSIS — R059 Cough, unspecified: Secondary | ICD-10-CM

## 2021-08-28 ENCOUNTER — Ambulatory Visit (INDEPENDENT_AMBULATORY_CARE_PROVIDER_SITE_OTHER): Payer: BC Managed Care – PPO

## 2021-08-28 ENCOUNTER — Ambulatory Visit (INDEPENDENT_AMBULATORY_CARE_PROVIDER_SITE_OTHER): Payer: BC Managed Care – PPO | Admitting: Rheumatology

## 2021-08-28 ENCOUNTER — Encounter: Payer: Self-pay | Admitting: Rheumatology

## 2021-08-28 VITALS — BP 176/94 | HR 89 | Resp 15 | Ht 65.0 in | Wt 217.0 lb

## 2021-08-28 DIAGNOSIS — M170 Bilateral primary osteoarthritis of knee: Secondary | ICD-10-CM

## 2021-08-28 DIAGNOSIS — I10 Essential (primary) hypertension: Secondary | ICD-10-CM

## 2021-08-28 DIAGNOSIS — N182 Chronic kidney disease, stage 2 (mild): Secondary | ICD-10-CM

## 2021-08-28 DIAGNOSIS — M79642 Pain in left hand: Secondary | ICD-10-CM

## 2021-08-28 DIAGNOSIS — M79671 Pain in right foot: Secondary | ICD-10-CM | POA: Diagnosis not present

## 2021-08-28 DIAGNOSIS — M5030 Other cervical disc degeneration, unspecified cervical region: Secondary | ICD-10-CM

## 2021-08-28 DIAGNOSIS — M0579 Rheumatoid arthritis with rheumatoid factor of multiple sites without organ or systems involvement: Secondary | ICD-10-CM

## 2021-08-28 DIAGNOSIS — M5136 Other intervertebral disc degeneration, lumbar region: Secondary | ICD-10-CM

## 2021-08-28 DIAGNOSIS — M19072 Primary osteoarthritis, left ankle and foot: Secondary | ICD-10-CM

## 2021-08-28 DIAGNOSIS — Z79899 Other long term (current) drug therapy: Secondary | ICD-10-CM

## 2021-08-28 DIAGNOSIS — M24529 Contracture, unspecified elbow: Secondary | ICD-10-CM

## 2021-08-28 DIAGNOSIS — M79641 Pain in right hand: Secondary | ICD-10-CM | POA: Diagnosis not present

## 2021-08-28 DIAGNOSIS — M79672 Pain in left foot: Secondary | ICD-10-CM

## 2021-08-28 DIAGNOSIS — Z8639 Personal history of other endocrine, nutritional and metabolic disease: Secondary | ICD-10-CM

## 2021-08-28 DIAGNOSIS — M19071 Primary osteoarthritis, right ankle and foot: Secondary | ICD-10-CM

## 2021-08-28 DIAGNOSIS — M1A09X0 Idiopathic chronic gout, multiple sites, without tophus (tophi): Secondary | ICD-10-CM

## 2021-08-28 DIAGNOSIS — M791 Myalgia, unspecified site: Secondary | ICD-10-CM

## 2021-08-28 MED ORDER — HYDROXYCHLOROQUINE SULFATE 200 MG PO TABS: 200.0000 mg | ORAL_TABLET | Freq: Two times a day (BID) | ORAL | 0 refills | Status: DC

## 2021-08-28 NOTE — Patient Instructions (Signed)
Hydroxychloroquine Tablets What is this medication? HYDROXYCHLOROQUINE (hye drox ee KLOR oh k[place]) treats autoimmune conditions, such as rheumatoid arthritis and lupus. It works by slowing down an overactive immune system. It may also be used to prevent and treat malaria. It works by killing the parasite that causes malaria. It belongs to a group of medications called DMARDs. This medicine may be used for other purposes; ask your health care provider or pharmacist if you have questions. COMMON BRAND NAME(S): Plaquenil, Quineprox What should I tell my care team before I take this medication? They need to know if you have any of these conditions: Diabetes Eye disease, vision problems G6PD deficiency Heart disease History of irregular heartbeat If you often drink alcohol Kidney disease Liver disease Porphyria Psoriasis An unusual or allergic reaction to chloroquine, hydroxychloroquine, other medications, foods, dyes, or preservatives Pregnant or trying to get pregnant Breast-feeding How should I use this medication? Take this medication by mouth with a glass of water. Take it as directed on the prescription label. Do not cut, crush or chew this medication. Swallow the tablets whole. Take it with food. Do not take it more than directed. Take all of this medication unless your care team tells you to stop it early. Keep taking it even if you think you are better. Take products with antacids in them at a different time of day than this medication. Take this medication 4 hours before or 4 hours after antacids. Talk to your care team if you have questions. Talk to your care team about the use of this medication in children. While this medication may be prescribed for selected conditions, precautions do apply. Overdosage: If you think you have taken too much of this medicine contact a poison control center or emergency room at once. NOTE: This medicine is only for you. Do not share this medicine with  others. What if I miss a dose? If you miss a dose, take it as soon as you can. If it is almost time for your next dose, take only that dose. Do not take double or extra doses. What may interact with this medication? Do not take this medication with any of the following: Cisapride Dronedarone Pimozide Thioridazine This medication may also interact with the following: Ampicillin Antacids Cimetidine Cyclosporine Digoxin Kaolin Medications for diabetes, like insulin, glipizide, glyburide Medications for seizures like carbamazepine, phenobarbital, phenytoin Mefloquine Methotrexate Other medications that prolong the QT interval (cause an abnormal heart rhythm) Praziquantel This list may not describe all possible interactions. Give your health care provider a list of all the medicines, herbs, non-prescription drugs, or dietary supplements you use. Also tell them if you smoke, drink alcohol, or use illegal drugs. Some items may interact with your medicine. What should I watch for while using this medication? Visit your care team for regular checks on your progress. Tell your care team if your symptoms do not start to get better or if they get worse. You may need blood work done while you are taking this medication. If you take other medications that can affect heart rhythm, you may need more testing. Talk to your care team if you have questions. Your vision may be tested before and during use of this medication. Tell your care team right away if you have any change in your eyesight. This medication may cause serious skin reactions. They can happen weeks to months after starting the medication. Contact your care team right away if you notice fevers or flu-like symptoms with a rash. The   rash may be red or purple and then turn into blisters or peeling of the skin. Or, you might notice a red rash with swelling of the face, lips or lymph nodes in your neck or under your arms. If you or your family  notice any changes in your behavior, such as new or worsening depression, thoughts of harming yourself, anxiety, or other unusual or disturbing thoughts, or memory loss, call your care team right away. What side effects may I notice from receiving this medication? Side effects that you should report to your care team as soon as possible: Allergic reactions--skin rash, itching, hives, swelling of the face, lips, tongue, or throat Aplastic anemia--unusual weakness or fatigue, dizziness, headache, trouble breathing, increased bleeding or bruising Change in vision Heart rhythm changes--fast or irregular heartbeat, dizziness, feeling faint or lightheaded, chest pain, trouble breathing Infection--fever, chills, cough, or sore throat Low blood sugar (hypoglycemia)--tremors or shaking, anxiety, sweating, cold or clammy skin, confusion, dizziness, rapid heartbeat Muscle injury--unusual weakness or fatigue, muscle pain, dark yellow or brown urine, decrease in amount of urine Pain, tingling, or numbness in the hands or feet Rash, fever, and swollen lymph nodes Redness, blistering, peeling, or loosening of the skin, including inside the mouth Thoughts of suicide or self-harm, worsening mood, or feelings of depression Unusual bruising or bleeding Side effects that usually do not require medical attention (report to your care team if they continue or are bothersome): Diarrhea Headache Nausea Stomach pain Vomiting This list may not describe all possible side effects. Call your doctor for medical advice about side effects. You may report side effects to FDA at 1-800-FDA-1088. Where should I keep my medication? Keep out of the reach of children and pets. Store at room temperature up to 30 degrees C (86 degrees F). Protect from light. Get rid of any unused medication after the expiration date. To get rid of medications that are no longer needed or have expired: Take the medication to a medication take-back  program. Check with your pharmacy or law enforcement to find a location. If you cannot return the medication, check the label or package insert to see if the medication should be thrown out in the garbage or flushed down the toilet. If you are not sure, ask your care team. If it is safe to put it in the trash, empty the medication out of the container. Mix the medication with cat litter, dirt, coffee grounds, or other unwanted substance. Seal the mixture in a bag or container. Put it in the trash. NOTE: This sheet is a summary. It may not cover all possible information. If you have questions about this medicine, talk to your doctor, pharmacist, or health care provider.  2023 Elsevier/Gold Standard (2020-06-12 00:00:00)  Standing Labs We placed an order today for your standing lab work.   Please have your standing labs drawn in 1 month and then every 5 months  If possible, please have your labs drawn 2 weeks prior to your appointment so that the provider can discuss your results at your appointment.  Please note that you may see your imaging and lab results in MyChart before we have reviewed them. We may be awaiting multiple results to interpret others before contacting you. Please allow our office up to 72 hours to thoroughly review all of the results before contacting the office for clarification of your results.  We have open lab daily: Monday through Thursday from 1:30-4:30 PM and Friday from 1:30-4:00 PM at the office of Dr. Janalyn Rouse  [facility], [facility].   Please be advised, all patients with office appointments requiring lab work will take precedent over walk-in lab work.  If possible, please come for your lab work on Monday and Friday afternoons, as you may experience shorter wait times. The office is located at [address], [place], [place] [address] No appointment is necessary.   Labs are drawn by [facility]. Please bring your co-pay at the time of your lab  draw.  You may receive a bill from [facility] for your lab work.  Please note if you are on Hydroxychloroquine and and an order has been placed for a Hydroxychloroquine level, you will need to have it drawn 4 hours or more after your last dose.  If you wish to have your labs drawn at another location, please call the office 24 hours in advance to send orders.  If you have any questions regarding directions or hours of operation,  please call [phone].   As a reminder, please drink plenty of water prior to coming for your lab work. Thanks!  Vaccines You are taking a medication(s) that can suppress your immune system.  The following immunizations are recommended: Flu annually Covid-19  Td/Tdap (tetanus, diphtheria, pertussis) every 10 years Pneumonia (Prevnar 15 then Pneumovax 23 at least 1 year apart.  Alternatively, can take Prevnar 20 without needing additional dose) Shingrix: 2 doses from 4 weeks to 6 months apart  Please check with your PCP to make sure you are up to date.

## 2021-08-30 ENCOUNTER — Other Ambulatory Visit: Payer: Self-pay | Admitting: Nurse Practitioner

## 2021-08-30 DIAGNOSIS — R059 Cough, unspecified: Secondary | ICD-10-CM

## 2021-08-30 NOTE — Progress Notes (Signed)
Labs are consistent with rheumatoid arthritis.  I will discuss results at the follow-up visit.

## 2021-09-01 LAB — COMPLETE METABOLIC PANEL WITH GFR
AG Ratio: 1.7 (calc) (ref 1.0–2.5)
ALT: 16 U/L (ref 6–29)
AST: 15 U/L (ref 10–35)
Albumin: 4.4 g/dL (ref 3.6–5.1)
Alkaline phosphatase (APISO): 114 U/L (ref 37–153)
BUN: 12 mg/dL (ref 7–25)
CO2: 30 mmol/L (ref 20–32)
Calcium: 9.9 mg/dL (ref 8.6–10.4)
Chloride: 105 mmol/L (ref 98–110)
Creat: 0.96 mg/dL (ref 0.50–1.05)
Globulin: 2.6 g/dL (calc) (ref 1.9–3.7)
Glucose, Bld: 136 mg/dL — ABNORMAL HIGH (ref 65–99)
Potassium: 4 mmol/L (ref 3.5–5.3)
Sodium: 141 mmol/L (ref 135–146)
Total Bilirubin: 0.5 mg/dL (ref 0.2–1.2)
Total Protein: 7 g/dL (ref 6.1–8.1)
eGFR: 66 mL/min/{1.73_m2} (ref >=60)

## 2021-09-01 LAB — CBC WITH DIFFERENTIAL/PLATELET
Absolute Monocytes: 386 cells/uL (ref 200–950)
Basophils Absolute: 50 cells/uL (ref 0–200)
Basophils Relative: 0.9 %
Eosinophils Absolute: 101 cells/uL (ref 15–500)
Eosinophils Relative: 1.8 %
HCT: 44.8 % (ref 35.0–45.0)
Hemoglobin: 14.8 g/dL (ref 11.7–15.5)
Lymphs Abs: 1478 cells/uL (ref 850–3900)
MCH: 26.9 pg — ABNORMAL LOW (ref 27.0–33.0)
MCHC: 33 g/dL (ref 32.0–36.0)
MCV: 81.5 fL (ref 80.0–100.0)
MPV: 10.5 fL (ref 7.5–12.5)
Monocytes Relative: 6.9 %
Neutro Abs: 3584 cells/uL (ref 1500–7800)
Neutrophils Relative %: 64 %
Platelets: 306 10*3/uL (ref 140–400)
RBC: 5.5 10*6/uL — ABNORMAL HIGH (ref 3.80–5.10)
RDW: 13.8 % (ref 11.0–15.0)
Total Lymphocyte: 26.4 %
WBC: 5.6 10*3/uL (ref 3.8–10.8)

## 2021-09-01 LAB — ALDOLASE: Aldolase: 4.9 U/L (ref <=8.1)

## 2021-09-01 LAB — URIC ACID: Uric Acid, Serum: 7.2 mg/dL — ABNORMAL HIGH (ref 2.5–7.0)

## 2021-09-01 LAB — SEDIMENTATION RATE: Sed Rate: 17 mm/h (ref 0–30)

## 2021-09-01 LAB — CYCLIC CITRUL PEPTIDE ANTIBODY, IGG: Cyclic Citrullin Peptide Ab: <16 UNITS

## 2021-09-01 LAB — RHEUMATOID FACTOR: Rheumatoid fact SerPl-aCnc: 108 IU/mL — ABNORMAL HIGH (ref <14)

## 2021-09-01 LAB — CK: Total CK: 251 U/L — ABNORMAL HIGH (ref 29–143)

## 2021-09-30 NOTE — Progress Notes (Unsigned)
Office Visit Note  Patient: Ashley Monroe             Date of Birth: 10/08/1955           MRN: [id number]             PCP: Dorothyann Peng, MD Referring: Dorothyann Peng, MD Visit Date: 10/14/2021 Occupation: @GUAROCC @  Subjective:  Medication monitoring   History of Present Illness: Ashley Monroe is a 66 y.o. female with history of seropositive rheumatoid arthritis, osteoarthritis, and DDD.  She is taking plaquenil 200 mg 1 tablet by mouth twice daily. She was restarted on plaquenil after her last office visit on 08/28/21.   Patient is overdue to update plaquenil eye examination.  CBC and CMP updated on 08/28/21.   Activities of Daily Living:  Patient reports morning stiffness for *** {minute/hour:19697}.   Patient {ACTIONS;DENIES/REPORTS:21021675::"Denies"} nocturnal pain.  Difficulty dressing/grooming: {ACTIONS;DENIES/REPORTS:21021675::"Denies"} Difficulty climbing stairs: {ACTIONS;DENIES/REPORTS:21021675::"Denies"} Difficulty getting out of chair: {ACTIONS;DENIES/REPORTS:21021675::"Denies"} Difficulty using hands for taps, buttons, cutlery, and/or writing: {ACTIONS;DENIES/REPORTS:21021675::"Denies"}  No Rheumatology ROS completed.   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   Essential hypertension 06/01/2019   Rheumatoid arthritis involving multiple sites with positive rheumatoid factor (HCC) 04/25/2019   Class 2 severe obesity due to excess calories with serious comorbidity and body mass index (BMI) of 36.0 to 36.9 in adult (HCC) 04/25/2019   DDD (degenerative disc disease), cervical 03/15/2019   Carpal tunnel syndrome of right wrist 12/08/2018   Cervical post-laminectomy syndrome 08/28/2018   Stenosis of  cervical spine with myelopathy (HCC) 08/23/2017   Hyperlipidemia 08/19/2016   Chest pain 08/12/2016   Chronic kidney disease, stage 2 (mild) 08/12/2016   Hypertensive nephropathy 08/12/2016   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 08/12/2016   Other long term (current) drug therapy 08/12/2016   Type 2 diabetes mellitus with diabetic chronic kidney disease (HCC) 08/12/2016    Past Medical History:  Diagnosis Date   Allergy    Arthritis    RA   Carpal tunnel syndrome    Chronic kidney disease    Diabetes mellitus without complication (HCC)    Essential hypertension 06/01/2019   Hyperlipidemia 08/19/2016   Hypertension    PONV (postoperative nausea and vomiting)     Family History  Problem Relation Age of Onset   Atrial fibrillation Mother    Stroke Mother    Heart attack Mother    Aneurysm Mother    Atrial fibrillation Brother    Stroke Brother    Diabetes Brother    Diabetes Father    Alzheimer's disease Father    Diabetes Brother    Alzheimer's disease Maternal Grandmother    Heart attack Maternal Grandfather    Cancer Paternal Grandmother    Healthy Son    Healthy Daughter    Past Surgical History:  Procedure Laterality Date   ABDOMINAL HYSTERECTOMY  2003   ANTERIOR CERVICAL DECOMP/DISCECTOMY FUSION N/A 07/30/2019   Procedure: Anterior Cervicql Decompression Fusion - Cervcial four-Cervical five - Cervical five-Cervical six;  Surgeon: 08/01/2019, MD;  Location: [facility] OR;  Service: Neurosurgery;  Laterality: N/A;  3C   CERVICAL SPINE SURGERY  08/2017   CHOLECYSTECTOMY  1982   OVARIAN CYST REMOVAL  1981   Social  History   Social History Narrative   Not on file   Immunization History  Administered Date(s) Administered   DTaP 05/02/2012   Influenza,inj,Quad PF,6+ Mos 11/10/2018, 10/22/2019, 11/07/2020   Influenza-Unspecified 11/30/2017, 11/10/2018   PFIZER(Purple Top)SARS-COV-2 Vaccination 04/14/2019, 05/05/2019, 11/24/2019   Pfizer Covid-19  Vaccine Bivalent Booster 72yrs & up 11/27/2020   Zoster Recombinat (Shingrix) 12/01/2016, 11/27/2020     Objective: Vital Signs: There were no vitals taken for this visit.   Physical Exam Vitals and nursing note reviewed.  Constitutional:      Appearance: She is well-developed.  HENT:     Head: Normocephalic and atraumatic.  Eyes:     Conjunctiva/sclera: Conjunctivae normal.  Cardiovascular:     Rate and Rhythm: Normal rate and regular rhythm.     Heart sounds: Normal heart sounds.  Pulmonary:     Effort: Pulmonary effort is normal.     Breath sounds: Normal breath sounds.  Abdominal:     General: Bowel sounds are normal.     Palpations: Abdomen is soft.  Musculoskeletal:     Cervical back: Normal range of motion.  Skin:    General: Skin is warm and dry.     Capillary Refill: Capillary refill takes less than 2 seconds.  Neurological:     Mental Status: She is alert and oriented to person, place, and time.  Psychiatric:        Behavior: Behavior normal.      Musculoskeletal Exam: ***  CDAI Exam: CDAI Score: -- Patient Global: --; Provider Global: -- Swollen: --; Tender: -- Joint Exam 10/14/2021   No joint exam has been documented for this visit   There is currently no information documented on the homunculus. Go to the Rheumatology activity and complete the homunculus joint exam.  Investigation: No additional findings.  Imaging: No results found.  Recent Labs: Lab Results  Component Value Date   WBC 5.6 08/28/2021   HGB 14.8 08/28/2021   PLT 306 08/28/2021   NA 141 08/28/2021   K 4.0 08/28/2021   CL 105 08/28/2021   CO2 30 08/28/2021   GLUCOSE 136 (H) 08/28/2021   BUN 12 08/28/2021   CREATININE 0.96 08/28/2021   BILITOT 0.5 08/28/2021   ALKPHOS 126 (H) 06/04/2021   AST 15 08/28/2021   ALT 16 08/28/2021   PROT 7.0 08/28/2021   ALBUMIN 4.4 06/04/2021   CALCIUM 9.9 08/28/2021   GFRAA 75 03/26/2020   QFTBGOLDPLUS NEGATIVE 07/18/2018     Speciality Comments: PLQ eye exam: 10/17/2019 normal. [facility]. Follow up in 1 year.  Procedures:  No procedures performed Allergies: Amoxicillin and Codeine   Assessment / Plan:     Visit Diagnoses: No diagnosis found.  Orders: No orders of the defined types were placed in this encounter.  No orders of the defined types were placed in this encounter.   Face-to-face time spent with patient was *** minutes. Greater than 50% of time was spent in counseling and coordination of care.  Follow-Up Instructions: No follow-ups on file.   Ellen Henri, CMA  Note - This record has been created using Animal nutritionist.  Chart creation errors have been sought, but may not always  have been located. Such creation errors do not reflect on  the standard of medical care.

## 2021-10-05 ENCOUNTER — Ambulatory Visit: Payer: BC Managed Care – PPO | Admitting: Internal Medicine

## 2021-10-05 ENCOUNTER — Encounter: Payer: Self-pay | Admitting: Internal Medicine

## 2021-10-05 VITALS — BP 110/78 | HR 84 | Temp 98.1°F | Ht 65.0 in | Wt 216.4 lb

## 2021-10-05 DIAGNOSIS — I129 Hypertensive chronic kidney disease with stage 1 through stage 4 chronic kidney disease, or unspecified chronic kidney disease: Secondary | ICD-10-CM

## 2021-10-05 DIAGNOSIS — E1122 Type 2 diabetes mellitus with diabetic chronic kidney disease: Secondary | ICD-10-CM

## 2021-10-05 DIAGNOSIS — M25521 Pain in right elbow: Secondary | ICD-10-CM

## 2021-10-05 DIAGNOSIS — M791 Myalgia, unspecified site: Secondary | ICD-10-CM

## 2021-10-05 DIAGNOSIS — N182 Chronic kidney disease, stage 2 (mild): Secondary | ICD-10-CM

## 2021-10-05 DIAGNOSIS — Z6836 Body mass index (BMI) 36.0-36.9, adult: Secondary | ICD-10-CM

## 2021-10-05 DIAGNOSIS — M0579 Rheumatoid arthritis with rheumatoid factor of multiple sites without organ or systems involvement: Secondary | ICD-10-CM

## 2021-10-05 LAB — HEMOGLOBIN A1C
Est. average glucose Bld gHb Est-mCnc: 166 mg/dL
Hgb A1c MFr Bld: 7.4 % — ABNORMAL HIGH (ref 4.8–5.6)

## 2021-10-05 NOTE — Progress Notes (Signed)
I,Victoria T Hamilton,acting as a scribe for [name], MD.,have documented all relevant documentation on the behalf of [name], MD,as directed by  [name], MD while in the presence of [name], MD.   Subjective:     Patient ID: Ashley Monroe , female    DOB: 1955-11-12 , 66 y.o.   MRN: [id number]   Chief Complaint  Patient presents with   Diabetes    HPI  Patient is here for diabetes and blood pressure f/u. She reports compliance with meds. She denies headaches, chest pain and shortness of breath. She has no new concerns at this time.   Diabetes She presents for her follow-up diabetic visit. She has type 2 diabetes mellitus. Her disease course has been stable. Pertinent negatives for hypoglycemia include no dizziness. Pertinent negatives for diabetes include no blurred vision and no chest pain. There are no hypoglycemic complications. Risk factors for coronary artery disease include diabetes mellitus, dyslipidemia, hypertension, obesity and sedentary lifestyle. She is following a generally healthy diet. She participates in exercise intermittently. Her breakfast blood glucose is taken between 7-8 am. Her breakfast blood glucose range is generally 130-140 mg/dl. An ACE inhibitor/angiotensin II receptor blocker is being taken. Eye exam is current.  Hypertension This is a chronic problem. The current episode started more than 1 year ago. The problem has been gradually improving since onset. The problem is controlled. Pertinent negatives include no blurred vision, chest pain, palpitations or shortness of breath.     Past Medical History:  Diagnosis Date   Allergy    Arthritis    RA   Carpal tunnel syndrome    Chronic kidney disease    Diabetes mellitus without complication (HCC)    Essential hypertension 06/01/2019   Hyperlipidemia 08/19/2016   Hypertension    PONV (postoperative nausea and vomiting)      Family History  Problem Relation Age of Onset    Atrial fibrillation Mother    Stroke Mother    Heart attack Mother    Aneurysm Mother    Atrial fibrillation Brother    Stroke Brother    Diabetes Brother    Diabetes Father    Alzheimer's disease Father    Diabetes Brother    Alzheimer's disease Maternal Grandmother    Heart attack Maternal Grandfather    Cancer Paternal Grandmother    Healthy Son    Healthy Daughter      Current Outpatient Medications:    Accu-Chek FastClix Lancets MISC, Use as instructed to check blood sugars 2 times per day dx: E11.22, Disp: 300 each, Rfl: 2   amLODipine (NORVASC) 10 MG tablet, TAKE 1 TABLET BY MOUTH EVERY DAY, Disp: 90 tablet, Rfl: 2   aspirin EC 81 MG tablet, Take 81 mg by mouth daily., Disp: , Rfl:    cetirizine (ZYRTEC) 10 MG tablet, Take 10 mg by mouth daily., Disp: , Rfl:    Colchicine 0.6 MG CAPS, , Disp: , Rfl:    cyclobenzaprine (FLEXERIL) 10 MG tablet, Take 10 mg by mouth at bedtime as needed for muscle spasms., Disp: , Rfl: 0   DULoxetine (CYMBALTA) 30 MG capsule, TAKE 1 CAPSULE BY MOUTH EVERY DAY, Disp: 90 capsule, Rfl: 1   gabapentin (NEURONTIN) 300 MG capsule, TAKE 1 CAPSULE BY MOUTH THREE TIMES A DAY, Disp: , Rfl:    glucose blood (ACCU-CHEK GUIDE) test strip, USE AS INSTRUCTED TO CHECK BLOOD SUGARS 2 TIMES PER DAY, Disp: 200 strip, Rfl: 4  HYDROcodone-acetaminophen (NORCO) 10-325 MG tablet, TAKE 1 TABLET 3 TIMES A DAY BY ORAL ROUTE AS NEEDED., Disp: , Rfl:    hydroxychloroquine (PLAQUENIL) 200 MG tablet, Take 1 tablet (200 mg total) by mouth 2 (two) times daily., Disp: 180 tablet, Rfl: 0   Insulin Pen Needle (PEN NEEDLES) 32G X 4 MM MISC, Use as directed with Levemir, Disp: 150 each, Rfl: 2   LEVEMIR FLEXTOUCH 100 UNIT/ML FlexTouch Pen, INJECT 10 UNITS SUBCUTANEOUS DAILY AT BEDTIME, Disp: 3 mL, Rfl: 1   metFORMIN (GLUCOPHAGE) 500 MG tablet, TAKE 1 TABLET BY MOUTH 2 TIMES DAILY WITH A MEAL., Disp: 180 tablet, Rfl: 2   olmesartan-hydrochlorothiazide (BENICAR HCT) 40-25 MG  tablet, TAKE 1 TABLET BY MOUTH EVERY DAY, Disp: 90 tablet, Rfl: 1   OZEMPIC, 0.25 OR 0.5 MG/DOSE, 2 MG/1.5ML SOPN, INJECT 0.5 MG INTO THE SKIN ONCE A WEEK., Disp: 9 mL, Rfl: 3   Cetirizine HCl (ZYRTEC ALLERGY PO), Zyrtec (Patient not taking: Reported on 08/28/2021), Disp: , Rfl:    ipratropium (ATROVENT) 0.03 % nasal spray, SPRAY 2 SPRAYS INTO EACH NOSTRIL 4 TIMES A DAY AS NEEDED FOR RHINITIS, Disp: 2 mL, Rfl: 1   Allergies  Allergen Reactions   Amoxicillin Hives   Codeine Hives     Review of Systems  Constitutional: Negative.   Eyes:  Negative for blurred vision.  Respiratory: Negative.  Negative for shortness of breath.   Cardiovascular: Negative.  Negative for chest pain and palpitations.  Neurological: Negative.  Negative for dizziness.  Psychiatric/Behavioral: Negative.       Today's Vitals   10/05/21 1450  BP: 110/78  Pulse: 84  Temp: 98.1 F (36.7 C)  SpO2: 98%  Weight: 216 lb 6.4 oz (98.2 kg)  Height: 5\' 5"  (1.651 m)  PainSc: 0-No pain   Body mass index is 36.01 kg/m.  Wt Readings from Last 3 Encounters:  10/05/21 216 lb 6.4 oz (98.2 kg)  08/28/21 217 lb (98.4 kg)  06/04/21 215 lb 6.4 oz (97.7 kg)    Objective:  Physical Exam Vitals and nursing note reviewed.  Constitutional:      Appearance: Normal appearance.  HENT:     Head: Normocephalic and atraumatic.  Eyes:     Extraocular Movements: Extraocular movements intact.  Cardiovascular:     Rate and Rhythm: Normal rate and regular rhythm.     Heart sounds: Normal heart sounds.  Pulmonary:     Effort: Pulmonary effort is normal.     Breath sounds: Normal breath sounds.  Musculoskeletal:     Cervical back: Normal range of motion.  Skin:    General: Skin is warm.  Neurological:     General: No focal deficit present.     Mental Status: She is alert.  Psychiatric:        Mood and Affect: Mood normal.        Behavior: Behavior normal.         Assessment And Plan:     1. Type 2 diabetes mellitus  with stage 2 chronic kidney disease, without long-term current use of insulin (HCC) Comments: CBC, CMP results from Rheum reviewed in detail. I will check labs as below. She will rto in 4 months for re-evaluation.  - Hemoglobin A1c  2. Hypertensive nephropathy Comments: Chronic, well controlled.  She will c/w olmesartan/hctz 40/25mg  and amlodipine 10mg  daily.   3. Rheumatoid arthritis involving multiple sites with positive rheumatoid factor (HCC) Comments: Rheum input appreciated. She is encouraged to follow an anti-inflammatory diet.  4. Class 2 severe obesity due to excess calories with serious comorbidity and body mass index (BMI) of 36.0 to 36.9 in adult [facility]) Comments: She is encouraged to strive for BMI<30 to decrease cardiac risk, while aiming for at least of exercise per week.   Patient was given opportunity to ask questions. Patient verbalized understanding of the plan and was able to repeat key elements of the plan. All questions were answered to their satisfaction.   I, [name], MD, have reviewed all documentation for this visit. The documentation on 10/05/21 for the exam, diagnosis, procedures, and orders are all accurate and complete.   IF YOU HAVE BEEN REFERRED TO A SPECIALIST, IT MAY TAKE 1-2 WEEKS TO SCHEDULE/PROCESS THE REFERRAL. IF YOU HAVE NOT HEARD FROM US/SPECIALIST IN TWO WEEKS, PLEASE GIVE [place] A CALL AT [phone].   THE PATIENT IS ENCOURAGED TO PRACTICE SOCIAL DISTANCING DUE TO THE COVID-19 PANDEMIC.

## 2021-10-05 NOTE — Patient Instructions (Signed)

## 2021-10-14 ENCOUNTER — Ambulatory Visit: Payer: BC Managed Care – PPO | Attending: Physician Assistant | Admitting: Physician Assistant

## 2021-10-14 ENCOUNTER — Encounter: Payer: Self-pay | Admitting: Physician Assistant

## 2021-10-14 VITALS — BP 122/81 | HR 82 | Resp 16 | Ht 65.0 in | Wt 218.0 lb

## 2021-10-14 DIAGNOSIS — M19071 Primary osteoarthritis, right ankle and foot: Secondary | ICD-10-CM

## 2021-10-14 DIAGNOSIS — M19072 Primary osteoarthritis, left ankle and foot: Secondary | ICD-10-CM

## 2021-10-14 DIAGNOSIS — Z79899 Other long term (current) drug therapy: Secondary | ICD-10-CM

## 2021-10-14 DIAGNOSIS — N182 Chronic kidney disease, stage 2 (mild): Secondary | ICD-10-CM

## 2021-10-14 DIAGNOSIS — M19041 Primary osteoarthritis, right hand: Secondary | ICD-10-CM | POA: Diagnosis not present

## 2021-10-14 DIAGNOSIS — M0579 Rheumatoid arthritis with rheumatoid factor of multiple sites without organ or systems involvement: Secondary | ICD-10-CM

## 2021-10-14 DIAGNOSIS — Z8639 Personal history of other endocrine, nutritional and metabolic disease: Secondary | ICD-10-CM

## 2021-10-14 DIAGNOSIS — M1A09X0 Idiopathic chronic gout, multiple sites, without tophus (tophi): Secondary | ICD-10-CM

## 2021-10-14 DIAGNOSIS — M791 Myalgia, unspecified site: Secondary | ICD-10-CM

## 2021-10-14 DIAGNOSIS — M170 Bilateral primary osteoarthritis of knee: Secondary | ICD-10-CM

## 2021-10-14 DIAGNOSIS — M5030 Other cervical disc degeneration, unspecified cervical region: Secondary | ICD-10-CM

## 2021-10-14 DIAGNOSIS — M5136 Other intervertebral disc degeneration, lumbar region: Secondary | ICD-10-CM

## 2021-10-14 DIAGNOSIS — M24529 Contracture, unspecified elbow: Secondary | ICD-10-CM

## 2021-10-14 DIAGNOSIS — M19042 Primary osteoarthritis, left hand: Secondary | ICD-10-CM

## 2021-10-14 DIAGNOSIS — M79672 Pain in left foot: Secondary | ICD-10-CM

## 2021-10-14 DIAGNOSIS — M79641 Pain in right hand: Secondary | ICD-10-CM

## 2021-10-14 NOTE — Progress Notes (Signed)
CBC WNL

## 2021-10-15 LAB — COMPLETE METABOLIC PANEL WITH GFR
AG Ratio: 1.6 (calc) (ref 1.0–2.5)
ALT: 20 U/L (ref 6–29)
AST: 18 U/L (ref 10–35)
Albumin: 4.2 g/dL (ref 3.6–5.1)
Alkaline phosphatase (APISO): 109 U/L (ref 37–153)
BUN: 24 mg/dL (ref 7–25)
CO2: 27 mmol/L (ref 20–32)
Calcium: 9.9 mg/dL (ref 8.6–10.4)
Chloride: 105 mmol/L (ref 98–110)
Creat: 1.04 mg/dL (ref 0.50–1.05)
Globulin: 2.6 g/dL (calc) (ref 1.9–3.7)
Glucose, Bld: 174 mg/dL — ABNORMAL HIGH (ref 65–99)
Potassium: 4.5 mmol/L (ref 3.5–5.3)
Sodium: 141 mmol/L (ref 135–146)
Total Bilirubin: 0.4 mg/dL (ref 0.2–1.2)
Total Protein: 6.8 g/dL (ref 6.1–8.1)
eGFR: 60 mL/min/{1.73_m2} (ref >=60)

## 2021-10-15 LAB — CBC WITH DIFFERENTIAL/PLATELET
Absolute Monocytes: 451 cells/uL (ref 200–950)
Basophils Absolute: 69 cells/uL (ref 0–200)
Basophils Relative: 1.3 %
Eosinophils Absolute: 191 cells/uL (ref 15–500)
Eosinophils Relative: 3.6 %
HCT: 41.4 % (ref 35.0–45.0)
Hemoglobin: 13.7 g/dL (ref 11.7–15.5)
Lymphs Abs: 2115 cells/uL (ref 850–3900)
MCH: 27.3 pg (ref 27.0–33.0)
MCHC: 33.1 g/dL (ref 32.0–36.0)
MCV: 82.5 fL (ref 80.0–100.0)
MPV: 10.3 fL (ref 7.5–12.5)
Monocytes Relative: 8.5 %
Neutro Abs: 2475 cells/uL (ref 1500–7800)
Neutrophils Relative %: 46.7 %
Platelets: 305 10*3/uL (ref 140–400)
RBC: 5.02 10*6/uL (ref 3.80–5.10)
RDW: 13.8 % (ref 11.0–15.0)
Total Lymphocyte: 39.9 %
WBC: 5.3 10*3/uL (ref 3.8–10.8)

## 2021-10-15 NOTE — Progress Notes (Signed)
Glucose is elevated-174. Rest of CMP WNL.

## 2021-10-19 ENCOUNTER — Encounter: Payer: Self-pay | Admitting: Internal Medicine

## 2021-10-26 LAB — HM DIABETES EYE EXAM

## 2021-10-28 ENCOUNTER — Other Ambulatory Visit: Payer: Self-pay | Admitting: Internal Medicine

## 2021-11-25 ENCOUNTER — Other Ambulatory Visit: Payer: Self-pay | Admitting: Rheumatology

## 2021-11-25 NOTE — Telephone Encounter (Signed)
.  Next Visit: 01/18/2022  Last Visit: 10/14/2021  Labs: 10/14/2021 Glucose is elevated-174. Rest of CMP WNL. CBC WNL  Eye exam: 10/26/2021 WNL   Current Dose per office note 10/14/2021: Plaquenil 200 mg 1 tablet by mouth twice daily  SH:[id number] arthritis involving multiple sites with positive rheumatoid factor   Last Fill: 08/28/2021  Okay to refill Plaquenil?

## 2021-12-09 LAB — HM MAMMOGRAPHY

## 2021-12-15 ENCOUNTER — Other Ambulatory Visit: Payer: Self-pay | Admitting: Internal Medicine

## 2022-01-05 ENCOUNTER — Encounter: Payer: Self-pay | Admitting: Internal Medicine

## 2022-01-05 NOTE — Progress Notes (Signed)
Office Visit Note  Patient: Ashley Monroe             Date of Birth: 09-19-1955           MRN: [id number]             PCP: Glendale Chard, MD Referring: Glendale Chard, MD Visit Date: 01/18/2022 Occupation: _0 @  Subjective:  Left trochanteric bursitis  History of Present Illness: Ashley Monroe is a 66 y.o. female 3 of rheumatoid arthritis, osteoarthritis, degenerative disc disease and gouty arthropathy.  She states for the last few months she has been having pain and discomfort in her left.  She states this hurts her when she lays on her left side.  It causes nocturnal pain.  She has been taking hydroxychloroquine 200 mg p.o. twice daily without any interruption.  She continues to have some stiffness and discomfort in her bilateral hands, bilateral knee joints and her feet.  She has intermittent pain in her cervical and lumbar region.  She denies having any gout flare.  She has a prescription for colchicine which she did not have to use.  She has been trying to be on a low purine diet .   Activities of Daily Living:  Patient reports morning stiffness for 15 minutes.   Patient Reports nocturnal pain.  Difficulty dressing/grooming: Reports Difficulty climbing stairs: Reports Difficulty getting out of chair: Reports Difficulty using hands for taps, buttons, cutlery, and/or writing: Reports  Review of Systems  Constitutional:  Positive for fatigue.  HENT:  Negative for mouth sores and mouth dryness.   Eyes:  Negative for dryness.  Respiratory:  Negative for difficulty breathing.   Cardiovascular:  Negative for chest pain and palpitations.  Gastrointestinal:  Negative for blood in stool, constipation and diarrhea.  Endocrine: Negative for increased urination.  Genitourinary:  Negative for involuntary urination.  Musculoskeletal:  Positive for joint pain, gait problem, joint pain, myalgias, muscle weakness, morning stiffness, muscle tenderness and myalgias. Negative for  joint swelling.  Skin:  Negative for color change, rash, hair loss and sensitivity to sunlight.  Allergic/Immunologic: Negative for susceptible to infections.  Neurological:  Negative for dizziness and headaches.  Hematological:  Negative for swollen glands.  Psychiatric/Behavioral:  Positive for sleep disturbance. Negative for depressed mood. The patient is nervous/anxious.     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[place]) 06/14/2019   Essential hypertension 06/01/2019   Rheumatoid arthritis involving multiple sites with positive rheumatoid factor ([place]) 04/25/2019   Class 2 severe obesity due to excess calories with serious comorbidity and body mass index (BMI) of 36.0 to 36.9 in adult [facility]) 04/25/2019   DDD (degenerative disc disease), cervical 03/15/2019   Carpal tunnel syndrome of right wrist 12/08/2018   Cervical post-laminectomy syndrome 08/28/2018   Stenosis of cervical spine with myelopathy ([place]) 08/23/2017   Hyperlipidemia 08/19/2016   Chest pain 08/12/2016   Chronic kidney disease, stage 2 (mild) 08/12/2016   Hypertensive nephropathy 08/12/2016   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 08/12/2016   Other long term (current) drug therapy 08/12/2016   Type 2 diabetes mellitus with diabetic chronic kidney disease ([place]) 08/12/2016    Past Medical History:  Diagnosis Date   Allergy    Arthritis    RA  Carpal tunnel syndrome    Chronic kidney disease    Diabetes mellitus without complication ([place])    Essential hypertension 06/01/2019   Hyperlipidemia 08/19/2016   Hypertension    PONV (postoperative nausea and vomiting)     Family History  Problem Relation Age of Onset   Atrial fibrillation Mother    Stroke Mother     Heart attack Mother    Aneurysm Mother    Atrial fibrillation Brother    Stroke Brother    Diabetes Brother    Diabetes Father    Alzheimer's disease Father    Diabetes Brother    Alzheimer's disease Maternal Grandmother    Heart attack Maternal Grandfather    Cancer Paternal Grandmother    Healthy Son    Healthy Daughter    Past Surgical History:  Procedure Laterality Date   ABDOMINAL HYSTERECTOMY  2003   ANTERIOR CERVICAL DECOMP/DISCECTOMY FUSION N/A 07/30/2019   Procedure: Anterior Cervicql Decompression Fusion - Cervcial four-Cervical five - Cervical five-Cervical six;  Surgeon: Eustace Moore, MD;  Location: [place];  Service: Neurosurgery;  Laterality: N/A;  [place] SURGERY  08/2017   CHOLECYSTECTOMY  1982   OVARIAN CYST REMOVAL  1981   Social History   Social History Narrative   Not on file   Immunization History  Administered Date(s) Administered   DTaP 05/02/2012   Influenza,inj,Quad PF,6+ Mos 11/10/2018, 10/22/2019, 11/07/2020   Influenza-Unspecified 11/30/2017, 11/10/2018, 12/28/2021   PFIZER(Purple Top)SARS-COV-2 Vaccination 04/14/2019, 05/05/2019, 11/24/2019   Pfizer Covid-19 Vaccine Bivalent Booster 21yr & up 11/27/2020, 12/28/2021   Zoster Recombinat (Shingrix) 12/01/2016, 11/27/2020     Objective: Vital Signs: BP 122/83 (BP Location: Left Arm, Patient Position: Sitting, Cuff Size: Normal)   Pulse 81   Resp 15   Ht _0  (1.651 m)   Wt 223 lb 9.6 oz (101.4 kg)   BMI 37.21 kg/m    Physical Exam Vitals and nursing note reviewed.  Constitutional:      Appearance: She is well-developed.  HENT:     Head: Normocephalic and atraumatic.  Eyes:     Conjunctiva/sclera: Conjunctivae normal.  Cardiovascular:     Rate and Rhythm: Normal rate and regular rhythm.     Heart sounds: Normal heart sounds.  Pulmonary:     Effort: Pulmonary effort is normal.     Breath sounds: Normal breath sounds.  Abdominal:     General: Bowel sounds are normal.      Palpations: Abdomen is soft.  Musculoskeletal:     Cervical back: Normal range of motion.  Lymphadenopathy:     Cervical: No cervical adenopathy.  Skin:    General: Skin is warm and dry.     Capillary Refill: Capillary refill takes less than 2 seconds.  Neurological:     Mental Status: She is alert and oriented to person, place, and time.  Psychiatric:        Behavior: Behavior normal.      Musculoskeletal Exam: She had limited range of motion of the cervical spine.  She had painful limited range of motion of the lumbar spine.  Shoulder joints were in good range of motion.  She had right elbow joint contracture which was unchanged without synovitis.  Left elbow joint was in good range of motion.  Wrist joints in good range of motion.  She had bilateral PIP and DIP thickening.  No synovitis was noted over PIPs, MCPs or wrist joints.  Hip joints in good range of motion.  She had tenderness over left trochanteric bursa.  Knee joints in good range of motion.  There was no tenderness over ankles or MTPs.  CDAI Exam: CDAI Score: -- Patient Global: 2 mm; Provider Global: 2 mm Swollen: --; Tender: -- Joint Exam 01/18/2022   No joint exam has been documented for this visit   There is currently no information documented on the homunculus. Go to the Rheumatology activity and complete the homunculus joint exam.  Investigation: No additional findings.  Imaging: No results found.  Recent Labs: Lab Results  Component Value Date   WBC 5.3 10/14/2021   HGB 13.7 10/14/2021   PLT 305 10/14/2021   NA 141 10/14/2021   K 4.5 10/14/2021   CL 105 10/14/2021   CO2 27 10/14/2021   GLUCOSE 174 (H) 10/14/2021   BUN 24 10/14/2021   CREATININE 1.04 10/14/2021   BILITOT 0.4 10/14/2021   ALKPHOS 126 (H) 06/04/2021   AST 18 10/14/2021   ALT 20 10/14/2021   PROT 6.8 10/14/2021   ALBUMIN 4.4 06/04/2021   CALCIUM 9.9 10/14/2021   GFRAA 75 03/26/2020   QFTBGOLDPLUS NEGATIVE 07/18/2018     Speciality Comments: PLQ eye exam: 10/26/2021 [facility]. Follow up in 1 year.  Procedures:  No procedures performed Allergies: Amoxicillin and Codeine   Assessment / Plan:     Visit Diagnoses: Rheumatoid arthritis involving multiple sites with positive rheumatoid factor (HCC) - Positive RF, negative anti-CCP, -_0 eta, ESR 57: X-rays of both hands and feet were updated on 08/28/2021.  Patient had no synovitis on the examination today.  She resumed hydroxychloroquine in July.  She has noticed improvement in the joint pain and stiffness.  She has been taking hydroxychloroquine 200 mg twice daily on a regular basis without interruption.  High risk medication use - Plaquenil 200 mg 1 tablet by mouth twice daily.  Gap in therapy 2020-2023.  Restarted plaquenil after her last OV on 08/28/21.PLQ eye exam: 10/26/2021 - Plan: CBC with Differential/Platelet, COMPLETE METABOLIC PANEL WITH GFR in February and every 3 months.  Information on immunization was placed in the AVS.  She will get annual eye examination as long as she is on hydroxychloroquine.  Primary osteoarthritis of both hands -continues to have some stiffness in her hands.  No synovitis was noted.  X-rays of both hands were updated on 08/28/2021 and were consistent with osteoarthritic changes.  No erosive changes or MCP joint narrowing noted.  Contracture of right elbow-unchanged without synovitis.  Trochanteric bursitis, left hip-she has been having pain and discomfort over the left trochanteric bursa and nocturnal pain.  A handout on stretching exercises was given and some of the exercises were demonstrated in the office.  As she is diabetic I would avoid giving steroid injection.  I advised patient to contact us if her symptoms persist then we may consider physical therapy referral.  Primary osteoarthritis of both knees-she continues to have some pain and stiffness in her knee joints.  No warmth swelling or effusion was  noted.  Primary osteoarthritis of both feet -she has intermittent discomfort in her hands.  She has rheumatoid arthritis and osteoarthritis overlap.  X-rays of both feet were updated on 08/28/2021 and were consistent with osteoarthritic changes.  No erosive changes were noted.  DDD (degenerative disc disease), cervical -she has limited range of motion of her cervical spine.  Status post fusion in the past.  She is followed by Dr. Nelva Bush.  DDD (degenerative disc disease), lumbar-she continues to  have some lower back pain.  Idiopathic chronic gout of multiple sites without tophus -she has not had a gout flare.  Uric acid was 7.2 on August 28, 2021.  Colchicine 0.6 mg 1 capsule by mouth daily as needed during gout flares. - Plan: Uric acid  Myalgia -she has intermittent muscle pain only in her quadriceps now.  She does denies any generalized muscle pain.  CK was 578 on June 04, 2001.  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 CK was 251 on 08/28/21.  Stage 2 chronic kidney disease-creatinine was 1.04 on October 14, 2021  History of type 2 diabetes mellitus  History of hyperlipidemia  Orders: Orders Placed This Encounter  Procedures   CBC with Differential/Platelet   COMPLETE METABOLIC PANEL WITH GFR   Uric acid   No orders of the defined types were placed in this encounter.    Follow-Up Instructions: Return in about 5 months (around 06/19/2022) for Rheumatoid arthritis, Osteoarthritis, Gout.   Bo Merino, MD  Note - This record has been created using Editor, commissioning.  Chart creation errors have been sought, but may not always  have been located. Such creation errors do not reflect on  the standard of medical care.

## 2022-01-17 ENCOUNTER — Other Ambulatory Visit: Payer: Self-pay | Admitting: Internal Medicine

## 2022-01-18 ENCOUNTER — Encounter: Payer: Self-pay | Admitting: Internal Medicine

## 2022-01-18 ENCOUNTER — Ambulatory Visit: Payer: BC Managed Care – PPO | Attending: Rheumatology | Admitting: Rheumatology

## 2022-01-18 ENCOUNTER — Ambulatory Visit: Payer: BC Managed Care – PPO | Admitting: Internal Medicine

## 2022-01-18 ENCOUNTER — Encounter: Payer: Self-pay | Admitting: Rheumatology

## 2022-01-18 VITALS — BP 120/82 | HR 82 | Temp 98.3°F | Ht 65.0 in | Wt 223.8 lb

## 2022-01-18 VITALS — BP 122/83 | HR 81 | Resp 15 | Ht 65.0 in | Wt 223.6 lb

## 2022-01-18 DIAGNOSIS — M19041 Primary osteoarthritis, right hand: Secondary | ICD-10-CM

## 2022-01-18 DIAGNOSIS — M1A09X0 Idiopathic chronic gout, multiple sites, without tophus (tophi): Secondary | ICD-10-CM

## 2022-01-18 DIAGNOSIS — M19071 Primary osteoarthritis, right ankle and foot: Secondary | ICD-10-CM

## 2022-01-18 DIAGNOSIS — Z8639 Personal history of other endocrine, nutritional and metabolic disease: Secondary | ICD-10-CM

## 2022-01-18 DIAGNOSIS — N182 Chronic kidney disease, stage 2 (mild): Secondary | ICD-10-CM

## 2022-01-18 DIAGNOSIS — Z6837 Body mass index (BMI) 37.0-37.9, adult: Secondary | ICD-10-CM

## 2022-01-18 DIAGNOSIS — M7062 Trochanteric bursitis, left hip: Secondary | ICD-10-CM

## 2022-01-18 DIAGNOSIS — Z79899 Other long term (current) drug therapy: Secondary | ICD-10-CM

## 2022-01-18 DIAGNOSIS — M0579 Rheumatoid arthritis with rheumatoid factor of multiple sites without organ or systems involvement: Secondary | ICD-10-CM | POA: Diagnosis not present

## 2022-01-18 DIAGNOSIS — M24521 Contracture, right elbow: Secondary | ICD-10-CM

## 2022-01-18 DIAGNOSIS — I129 Hypertensive chronic kidney disease with stage 1 through stage 4 chronic kidney disease, or unspecified chronic kidney disease: Secondary | ICD-10-CM | POA: Diagnosis not present

## 2022-01-18 DIAGNOSIS — M51369 Other intervertebral disc degeneration, lumbar region without mention of lumbar back pain or lower extremity pain: Secondary | ICD-10-CM

## 2022-01-18 DIAGNOSIS — E1122 Type 2 diabetes mellitus with diabetic chronic kidney disease: Secondary | ICD-10-CM

## 2022-01-18 DIAGNOSIS — M19042 Primary osteoarthritis, left hand: Secondary | ICD-10-CM

## 2022-01-18 DIAGNOSIS — M5136 Other intervertebral disc degeneration, lumbar region: Secondary | ICD-10-CM

## 2022-01-18 DIAGNOSIS — Z23 Encounter for immunization: Secondary | ICD-10-CM | POA: Diagnosis not present

## 2022-01-18 DIAGNOSIS — M24529 Contracture, unspecified elbow: Secondary | ICD-10-CM

## 2022-01-18 DIAGNOSIS — M791 Myalgia, unspecified site: Secondary | ICD-10-CM

## 2022-01-18 DIAGNOSIS — M19072 Primary osteoarthritis, left ankle and foot: Secondary | ICD-10-CM

## 2022-01-18 DIAGNOSIS — M170 Bilateral primary osteoarthritis of knee: Secondary | ICD-10-CM

## 2022-01-18 DIAGNOSIS — M5030 Other cervical disc degeneration, unspecified cervical region: Secondary | ICD-10-CM

## 2022-01-18 MED ORDER — LEVEMIR FLEXPEN 100 UNIT/ML ~~LOC~~ SOPN: PEN_INJECTOR | SUBCUTANEOUS | 1 refills | Status: DC

## 2022-01-18 NOTE — Patient Instructions (Signed)

## 2022-01-18 NOTE — Patient Instructions (Addendum)
Standing Labs We placed an order today for your standing lab work.   Please have your standing labs drawn in February and every 3 months  Please have your labs drawn 2 weeks prior to your appointment so that the provider can discuss your lab results at your appointment.  Please note that you may see your imaging and lab results in MyChart before we have reviewed them. We will contact you once all results are reviewed. Please allow our office up to 72 hours to thoroughly review all of the results before contacting the office for clarification of your results.  Lab hours are:   Monday through Thursday from 8:00 am -12:30 pm and 1:00 pm-5:00 pm and Friday from 8:00 am-12:00 pm.  Please be advised, all patients with office appointments requiring lab work will take precedent over walk-in lab work.   Labs are drawn by [facility]. Please bring your co-pay at the time of your lab draw.  You may receive a bill from [facility] for your lab work.  Please note if you are on Hydroxychloroquine and and an order has been placed for a Hydroxychloroquine level, you will need to have it drawn 4 hours or more after your last dose.  If you wish to have your labs drawn at another location, please call the office 24 hours in advance so we can fax the orders.  The office is located at [address], [place], [place] [address] No appointment is necessary.    If you have any questions regarding directions or hours of operation,  please call [phone].   As a reminder, please drink plenty of water prior to coming for your lab work. Thanks!   Vaccines You are taking a medication(s) that can suppress your immune system.  The following immunizations are recommended: Flu annually Covid-19  Td/Tdap (tetanus, diphtheria, pertussis) every 10 years Pneumonia (Prevnar 15 then Pneumovax 23 at least 1 year apart.  Alternatively, can take Prevnar 20 without needing additional dose) Shingrix: 2 doses from 4 weeks  to 6 months apart  Please check with your PCP to make sure you are up to date.   Iliotibial Band Syndrome Rehab Ask your health care provider which exercises are safe for you. Do exercises exactly as told by your health care provider and adjust them as directed. It is normal to feel mild stretching, pulling, tightness, or discomfort as you do these exercises. Stop right away if you feel sudden pain or your pain gets significantly worse. Do not begin these exercises until told by your health care provider. Stretching and range-of-motion exercises These exercises warm up your muscles and joints and improve the movement and flexibility of your hip and pelvis. Quadriceps stretch, prone  Lie on your abdomen (prone position) on a firm surface, such as a bed or padded floor. Bend your left / right knee and reach back to hold your ankle or pant leg. If you cannot reach your ankle or pant leg, loop a belt around your foot and grab the belt instead. Gently pull your heel toward your buttocks. Your knee should not slide out to the side. You should feel a stretch in the front of your thigh and knee (quadriceps). Hold this position for __________ seconds. Repeat __________ times. Complete this exercise __________ times a day. Iliotibial band stretch An iliotibial band is a strong band of muscle tissue that runs from the outer side of your hip to the outer side of your thigh and knee. Lie on your  side with your left / right leg in the top position. Bend both of your knees and grab your left / right ankle. Stretch out your bottom arm to help you balance. Slowly bring your top knee back so your thigh goes behind your trunk. Slowly lower your top leg toward the floor until you feel a gentle stretch on the outside of your left / right hip and thigh. If you do not feel a stretch and your knee will not fall farther, place the heel of your other foot on top of your knee and pull your knee down toward the floor with  your foot. Hold this position for __________ seconds. Repeat __________ times. Complete this exercise __________ times a day. Strengthening exercises These exercises build strength and endurance in your hip and pelvis. Endurance is the ability to use your muscles for a long time, even after they get tired. Straight leg raises, side-lying This exercise strengthens the muscles that rotate the leg at the hip and move it away from your body (hip abductors). Lie on your side with your left / right leg in the top position. Lie so your head, shoulder, hip, and knee line up. You may bend your bottom knee to help you balance. Roll your hips slightly forward so your hips are stacked directly over each other and your left / right knee is facing forward. Tense the muscles in your outer thigh and lift your top leg 4-6 inches (10-15 cm). Hold this position for __________ seconds. Slowly lower your leg to return to the starting position. Let your muscles relax completely before doing another repetition. Repeat __________ times. Complete this exercise __________ times a day. Leg raises, prone This exercise strengthens the muscles that move the hips backward (hip extensors). Lie on your abdomen (prone position) on your bed or a firm surface. You can put a pillow under your hips if that is more comfortable for your lower back. Bend your left / right knee so your foot is straight up in the air. Squeeze your buttocks muscles and lift your left / right thigh off the bed. Do not let your back arch. Tense your thigh muscle as hard as you can without increasing any knee pain. Hold this position for __________ seconds. Slowly lower your leg to return to the starting position and allow it to relax completely. Repeat __________ times. Complete this exercise __________ times a day. Hip hike Stand sideways on a bottom step. Stand on your left / right leg with your other foot unsupported next to the step. You can hold on  to a railing or wall for balance if needed. Keep your knees straight and your torso square. Then lift your left / right hip up toward the ceiling. Slowly let your left / right hip lower toward the floor, past the starting position. Your foot should get closer to the floor. Do not lean or bend your knees. Repeat __________ times. Complete this exercise __________ times a day. This information is not intended to replace advice given to you by your health care provider. Make sure you discuss any questions you have with your health care provider. Document Revised: 04/04/2019 Document Reviewed: 04/04/2019 Elsevier Patient Education  2023 [employer].

## 2022-01-18 NOTE — Progress Notes (Signed)
Rich Brave [place]leton,acting as a Education administrator for Maximino Greenland, MD.,have documented all relevant documentation on the behalf of Maximino Greenland, MD,as directed by  Maximino Greenland, MD while in the presence of Maximino Greenland, MD.    Subjective:     Patient ID: Ashley Monroe , female    DOB: Apr 28, 1955 , 66 y.o.   MRN: [id number]   Chief Complaint  Patient presents with   Diabetes   Hypertension    HPI  Patient is here for diabetes and blood pressure f/u. She reports compliance with meds. She denies headaches, chest pain and shortness of breath.  She is looking forward to retiring in Feb 2024. She states the hard floors are taking a toll on her.  She has no new concerns at this time.   Diabetes She presents for her follow-up diabetic visit. She has type 2 diabetes mellitus. Her disease course has been stable. Pertinent negatives for hypoglycemia include no dizziness. Pertinent negatives for diabetes include no blurred vision, no chest pain, no polydipsia, no polyphagia and no polyuria. There are no hypoglycemic complications. Risk factors for coronary artery disease include diabetes mellitus, dyslipidemia, hypertension, obesity and sedentary lifestyle. She is following a generally healthy diet. She participates in exercise intermittently. Her breakfast blood glucose is taken between 7-8 am. Her breakfast blood glucose range is generally 130-140 mg/dl. An ACE inhibitor/angiotensin II receptor blocker is being taken. Eye exam is current.  Hypertension This is a chronic problem. The current episode started more than 1 year ago. The problem has been gradually improving since onset. The problem is controlled. Pertinent negatives include no blurred vision, chest pain, palpitations or shortness of breath.     Past Medical History:  Diagnosis Date   Allergy    Arthritis    RA   Carpal tunnel syndrome    Chronic kidney disease    Diabetes mellitus without complication ([place])    Essential  hypertension 06/01/2019   Hyperlipidemia 08/19/2016   Hypertension    PONV (postoperative nausea and vomiting)      Family History  Problem Relation Age of Onset   Atrial fibrillation Mother    Stroke Mother    Heart attack Mother    Aneurysm Mother    Atrial fibrillation Brother    Stroke Brother    Diabetes Brother    Diabetes Father    Alzheimer's disease Father    Diabetes Brother    Alzheimer's disease Maternal Grandmother    Heart attack Maternal Grandfather    Cancer Paternal Grandmother    Healthy Son    Healthy Daughter      Current Outpatient Medications:    Accu-Chek FastClix Lancets MISC, Use as instructed to check blood sugars 2 times per day dx: E11.22, Disp: 300 each, Rfl: 2   amLODipine (NORVASC) 10 MG tablet, TAKE 1 TABLET BY MOUTH EVERY DAY, Disp: 90 tablet, Rfl: 2   aspirin EC 81 MG tablet, Take 81 mg by mouth daily., Disp: , Rfl:    cetirizine (ZYRTEC) 10 MG tablet, Take 10 mg by mouth daily., Disp: , Rfl:    Colchicine 0.6 MG CAPS, , Disp: , Rfl:    cyclobenzaprine (FLEXERIL) 10 MG tablet, Take 10 mg by mouth at bedtime as needed for muscle spasms., Disp: , Rfl: 0   DULoxetine (CYMBALTA) 30 MG capsule, TAKE 1 CAPSULE BY MOUTH EVERY DAY, Disp: 90 capsule, Rfl: 1   gabapentin (NEURONTIN) 300 MG capsule, TAKE 1 CAPSULE BY MOUTH THREE TIMES  A DAY, Disp: , Rfl:    glucose blood (ACCU-CHEK GUIDE) test strip, USE AS INSTRUCTED TO CHECK BLOOD SUGARS 2 TIMES PER DAY, Disp: 200 strip, Rfl: 4   HYDROcodone-acetaminophen (NORCO) 10-325 MG tablet, TAKE 1 TABLET 3 TIMES A DAY BY ORAL ROUTE AS NEEDED., Disp: , Rfl:    hydroxychloroquine (PLAQUENIL) 200 MG tablet, TAKE 1 TABLET BY MOUTH TWICE A DAY, Disp: 180 tablet, Rfl: 0   Insulin Pen Needle (PEN NEEDLES) 32G X 4 MM MISC, Use as directed with Levemir, Disp: 150 each, Rfl: 2   metFORMIN (GLUCOPHAGE) 500 MG tablet, TAKE 1 TABLET BY MOUTH 2 TIMES DAILY WITH A MEAL., Disp: 180 tablet, Rfl: 2   olmesartan-hydrochlorothiazide  (BENICAR HCT) 40-25 MG tablet, TAKE 1 TABLET BY MOUTH EVERY DAY, Disp: 90 tablet, Rfl: 1   OZEMPIC, 0.25 OR 0.5 MG/DOSE, 2 MG/3ML SOPN, INJECT 0.5 MG INTO THE SKIN ONCE A WEEK., Disp: 9 mL, Rfl: 1   insulin detemir (LEVEMIR FLEXPEN) 100 UNIT/ML FlexPen, INJECT 20 UNITS SUBCUTANEOUS DAILY AT BEDTIME, max titration dose 60 units, Disp: 15 mL, Rfl: 1   Allergies  Allergen Reactions   Amoxicillin Hives   Codeine Hives     Review of Systems  Constitutional: Negative.   Eyes: Negative.  Negative for blurred vision.  Respiratory: Negative.  Negative for shortness of breath.   Cardiovascular: Negative.  Negative for chest pain and palpitations.  Gastrointestinal: Negative.   Endocrine: Negative for polydipsia, polyphagia and polyuria.  Musculoskeletal: Negative.   Skin: Negative.   Neurological: Negative.  Negative for dizziness.  Psychiatric/Behavioral: Negative.       Today's Vitals   01/18/22 1602  BP: 120/82  Pulse: 82  Temp: 98.3 F (36.8 C)  Weight: 223 lb 12.8 oz (101.5 kg)  Height: _0  (1.651 m)  PainSc: 8   PainLoc: Hip   Body mass index is 37.24 kg/m.  Wt Readings from Last 3 Encounters:  01/18/22 223 lb 12.8 oz (101.5 kg)  01/18/22 223 lb 9.6 oz (101.4 kg)  10/14/21 218 lb (98.9 kg)     Objective:  Physical Exam Vitals and nursing note reviewed.  Constitutional:      Appearance: Normal appearance.  HENT:     Head: Normocephalic and atraumatic.     Nose:     Comments: Masked     Mouth/Throat:     Comments: Masked  Eyes:     Extraocular Movements: Extraocular movements intact.  Cardiovascular:     Rate and Rhythm: Normal rate and regular rhythm.     Heart sounds: Normal heart sounds.  Pulmonary:     Effort: Pulmonary effort is normal.     Breath sounds: Normal breath sounds.  Musculoskeletal:     Cervical back: Normal range of motion.  Skin:    General: Skin is warm.  Neurological:     General: No focal deficit present.     Mental Status: She is  alert.  Psychiatric:        Mood and Affect: Mood normal.        Behavior: Behavior normal.         Assessment And Plan:     1. Type 2 diabetes mellitus with stage 2 chronic kidney disease, without long-term current use of insulin (HCC) Comments: Chronic, I will check labs as below. Pt intolerant of Farxiga. She is willing to consider Jardiance at next visit. I do plan to wean her off of insulin if A1c continues to improve. I would like  her to consider SGLT2 inhibitor for renal protection.  - BMP8+EGFR - Hemoglobin A1c  2. Hypertensive nephropathy Comments: Chronic, controlled. She will c/w olmesartan/hct 40/6m and amlodipine 161mdaily. Reminded to stay well hydrated.  3. Class 2 severe obesity due to excess calories with serious comorbidity and body mass index (BMI) of 37.0 to 37.9 in adult ([facility]: She is encouraged to aim for at least 150 minutes per week, while striving for BMI<30 to decrease cardiac risk. I plan to refer her to [facility] next year after she has retired.   4. Immunization due - Pneumococcal conjugate vaccine 20-valent (Prevnar-20)   Patient was given opportunity to ask questions. Patient verbalized understanding of the plan and was able to repeat key elements of the plan. All questions were answered to their satisfaction.   I, RoMaximino GreenlandMD, have reviewed all documentation for this visit. The documentation on 01/18/22 for the exam, diagnosis, procedures, and orders are all accurate and complete.   IF YOU HAVE BEEN REFERRED TO A SPECIALIST, IT MAY TAKE 1-2 WEEKS TO SCHEDULE/PROCESS THE REFERRAL. IF YOU HAVE NOT HEARD FROM US/SPECIALIST IN TWO WEEKS, PLEASE GIVE [place] CALL AT [phone].   THE PATIENT IS ENCOURAGED TO PRACTICE SOCIAL DISTANCING DUE TO THE COVID-19 PANDEMIC.

## 2022-01-19 LAB — BMP8+EGFR
BUN/Creatinine Ratio: 21 (ref 12–28)
BUN: 20 mg/dL (ref 8–27)
CO2: 23 mmol/L (ref 20–29)
Calcium: 9.9 mg/dL (ref 8.7–10.3)
Chloride: 103 mmol/L (ref 96–106)
Creatinine, Ser: 0.94 mg/dL (ref 0.57–1.00)
Glucose: 127 mg/dL — ABNORMAL HIGH (ref 70–99)
Potassium: 3.8 mmol/L (ref 3.5–5.2)
Sodium: 141 mmol/L (ref 134–144)
eGFR: 68 mL/min/{1.73_m2} (ref >59)

## 2022-01-19 LAB — HEMOGLOBIN A1C
Est. average glucose Bld gHb Est-mCnc: 171 mg/dL
Hgb A1c MFr Bld: 7.6 % — ABNORMAL HIGH (ref 4.8–5.6)

## 2022-02-05 ENCOUNTER — Encounter: Payer: Self-pay | Admitting: Internal Medicine

## 2022-02-11 ENCOUNTER — Other Ambulatory Visit: Payer: Self-pay | Admitting: Physician Assistant

## 2022-02-12 NOTE — Telephone Encounter (Signed)
Next Visit: 06/21/2022  Last Visit: 01/18/2022  Labs: 10/14/2021 Glucose is elevated-174. Rest of CMP WNL.   Eye exam: 10/26/2021 WNL    Current Dose per office note 01/18/2022: Plaquenil 200 mg 1 tablet by mouth twice daily.   DX: Rheumatoid arthritis involving multiple sites with positive rheumatoid factor   Last Fill: 11/25/2021  Okay to refill Plaquenil?

## 2022-02-16 ENCOUNTER — Other Ambulatory Visit: Payer: Self-pay

## 2022-02-16 ENCOUNTER — Other Ambulatory Visit: Payer: Self-pay | Admitting: Internal Medicine

## 2022-02-16 MED ORDER — TRESIBA FLEXTOUCH 200 UNIT/ML ~~LOC~~ SOPN: 20.0000 [IU] | PEN_INJECTOR | Freq: Every day | SUBCUTANEOUS | 3 refills | Status: DC

## 2022-02-18 ENCOUNTER — Encounter: Payer: BC Managed Care – PPO | Admitting: Internal Medicine

## 2022-02-23 ENCOUNTER — Telehealth: Payer: Self-pay

## 2022-02-23 NOTE — Telephone Encounter (Signed)
Looked at rx benefits  

## 2022-03-23 ENCOUNTER — Other Ambulatory Visit: Payer: Self-pay | Admitting: Internal Medicine

## 2022-04-03 ENCOUNTER — Other Ambulatory Visit: Payer: Self-pay | Admitting: Internal Medicine

## 2022-04-05 ENCOUNTER — Other Ambulatory Visit: Payer: Self-pay | Admitting: *Deleted

## 2022-04-05 DIAGNOSIS — Z79899 Other long term (current) drug therapy: Secondary | ICD-10-CM

## 2022-04-05 DIAGNOSIS — M1A09X0 Idiopathic chronic gout, multiple sites, without tophus (tophi): Secondary | ICD-10-CM

## 2022-04-06 ENCOUNTER — Encounter: Payer: Self-pay | Admitting: *Deleted

## 2022-04-06 ENCOUNTER — Other Ambulatory Visit: Payer: Self-pay | Admitting: *Deleted

## 2022-04-06 LAB — CBC WITH DIFFERENTIAL/PLATELET
Absolute Monocytes: 475 cells/uL (ref 200–950)
Basophils Absolute: 59 cells/uL (ref 0–200)
Basophils Relative: 1.1 %
Eosinophils Absolute: 189 cells/uL (ref 15–500)
Eosinophils Relative: 3.5 %
HCT: 43.4 % (ref 35.0–45.0)
Hemoglobin: 13.9 g/dL (ref 11.7–15.5)
Lymphs Abs: 2214 cells/uL (ref 850–3900)
MCH: 26.8 pg — ABNORMAL LOW (ref 27.0–33.0)
MCHC: 32 g/dL (ref 32.0–36.0)
MCV: 83.8 fL (ref 80.0–100.0)
MPV: 10.1 fL (ref 7.5–12.5)
Monocytes Relative: 8.8 %
Neutro Abs: 2462 cells/uL (ref 1500–7800)
Neutrophils Relative %: 45.6 %
Platelets: 295 10*3/uL (ref 140–400)
RBC: 5.18 10*6/uL — ABNORMAL HIGH (ref 3.80–5.10)
RDW: 13.5 % (ref 11.0–15.0)
Total Lymphocyte: 41 %
WBC: 5.4 10*3/uL (ref 3.8–10.8)

## 2022-04-06 LAB — COMPLETE METABOLIC PANEL WITH GFR
AG Ratio: 1.5 (calc) (ref 1.0–2.5)
ALT: 16 U/L (ref 6–29)
AST: 15 U/L (ref 10–35)
Albumin: 4.2 g/dL (ref 3.6–5.1)
Alkaline phosphatase (APISO): 105 U/L (ref 37–153)
BUN: 15 mg/dL (ref 7–25)
CO2: 28 mmol/L (ref 20–32)
Calcium: 9.9 mg/dL (ref 8.6–10.4)
Chloride: 104 mmol/L (ref 98–110)
Creat: 0.85 mg/dL (ref 0.50–1.05)
Globulin: 2.8 g/dL (calc) (ref 1.9–3.7)
Glucose, Bld: 163 mg/dL — ABNORMAL HIGH (ref 65–99)
Potassium: 4.7 mmol/L (ref 3.5–5.3)
Sodium: 140 mmol/L (ref 135–146)
Total Bilirubin: 0.4 mg/dL (ref 0.2–1.2)
Total Protein: 7 g/dL (ref 6.1–8.1)
eGFR: 76 mL/min/{1.73_m2} (ref >=60)

## 2022-04-06 LAB — URIC ACID: Uric Acid, Serum: 7.6 mg/dL — ABNORMAL HIGH (ref 2.5–7.0)

## 2022-04-06 NOTE — Telephone Encounter (Signed)
Patient scheduled for an appointment for 04/07/2022 at 8/20 am.

## 2022-04-06 NOTE — Progress Notes (Signed)
CBC is normal, glucose is elevated.  Uric acid is 7.6.  The goal is to keep uric acid below 6.  Patient is not on any medications to decrease uric acid level.  She should avoid red meat, shellfish and alcohol consumption.  If she develops a gout flare she should be on allopurinol.

## 2022-04-06 NOTE — Progress Notes (Unsigned)
Office Visit Note  Patient: Ashley Monroe             Date of Birth: November 18, 1955           MRN: [id number]             PCP: Glendale Chard, MD Referring: Glendale Chard, MD Visit Date: 04/07/2022 Occupation: '@GUAROCC'$ @  Subjective:  Discussed the use of allopurinol  History of Present Illness: Ashley Monroe is a 67 y.o. female with history of positive rheumatoid arthritis, osteoarthritis, and gout.  Patient is taking Plaquenil 200 mg 1 tablet by mouth twice daily for management of rheumatoid arthritis.  She is currently experiencing a gout flare.  She presents today to discussed initiating allopurinol.  She has been taking colchicine as needed during gout flares.  Her uric acid level remains elevated at 7.6 on 04/05/2022.  Uric acid was 7.6 on 04/05/2022.    Activities of Daily Living:  Patient reports morning stiffness for *** {minute/hour:19697}.   Patient {ACTIONS;DENIES/REPORTS:21021675::"Denies"} nocturnal pain.  Difficulty dressing/grooming: {ACTIONS;DENIES/REPORTS:21021675::"Denies"} Difficulty climbing stairs: {ACTIONS;DENIES/REPORTS:21021675::"Denies"} Difficulty getting out of chair: {ACTIONS;DENIES/REPORTS:21021675::"Denies"} Difficulty using hands for taps, buttons, cutlery, and/or writing: {ACTIONS;DENIES/REPORTS:21021675::"Denies"}  No Rheumatology ROS completed.   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   Essential hypertension 06/01/2019   Rheumatoid arthritis involving multiple sites with positive rheumatoid factor ([place]) 04/25/2019   Class 2 severe obesity due to excess calories with serious comorbidity and body mass index (BMI) of 36.0 to 36.9 in adult ([place]) 04/25/2019   DDD (degenerative disc  disease), cervical 03/15/2019   Carpal tunnel syndrome of right wrist 12/08/2018   Cervical post-laminectomy syndrome 08/28/2018   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   Hyperlipidemia 08/19/2016   Chest pain 08/12/2016   Chronic kidney disease, stage 2 (mild) 08/12/2016   Hypertensive nephropathy 08/12/2016   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 08/12/2016   Other long term (current) drug therapy 08/12/2016   Type 2 diabetes mellitus with diabetic chronic kidney disease ([place]) 08/12/2016    Past Medical History:  Diagnosis Date   Allergy    Arthritis    RA   Carpal tunnel syndrome    Chronic kidney disease    Diabetes mellitus without complication ([place])    Essential hypertension 06/01/2019   Hyperlipidemia 08/19/2016   Hypertension    PONV (postoperative nausea and vomiting)     Family History  Problem Relation Age of Onset   Atrial fibrillation Mother    Stroke Mother    Heart attack Mother    Aneurysm Mother    Atrial fibrillation Brother    Stroke Brother    Diabetes Brother    Diabetes Father    Alzheimer's disease Father    Diabetes Brother    Alzheimer's disease Maternal Grandmother    Heart attack Maternal Grandfather    Cancer Paternal Grandmother    Healthy Son    Healthy Daughter    Past Surgical History:  Procedure Laterality Date   ABDOMINAL HYSTERECTOMY  2003   ANTERIOR CERVICAL DECOMP/DISCECTOMY FUSION N/A 07/30/2019   Procedure: Anterior Cervicql Decompression Fusion - Cervcial four-Cervical five - Cervical five-Cervical six;  Surgeon: Eustace Moore, MD;  Location: [facility];  Service: Neurosurgery;  Laterality: N/A;  [place] SURGERY  08/2017   CHOLECYSTECTOMY  1982   OVARIAN CYST REMOVAL  1981   Social History   Social History Narrative   Not on file   Immunization History  Administered Date(s) Administered   DTaP 05/02/2012   Influenza,inj,Quad PF,6+ Mos 11/10/2018, 10/22/2019, 11/07/2020    Influenza-Unspecified 11/30/2017, 11/10/2018, 12/28/2021   PFIZER(Purple Top)SARS-COV-2 Vaccination 04/14/2019, 05/05/2019, 11/24/2019   PNEUMOCOCCAL CONJUGATE-20 01/18/2022   Pfizer Covid-19 Vaccine Bivalent Booster 3yr & up 11/27/2020, 12/28/2021   Tdap 05/01/2012   Zoster Recombinat (Shingrix) 12/01/2016, 11/27/2020     Objective: Vital Signs: There were no vitals taken for this visit.   Physical Exam Vitals and nursing note reviewed.  Constitutional:      Appearance: She is well-developed.  HENT:     Head: Normocephalic and atraumatic.  Eyes:     Conjunctiva/sclera: Conjunctivae normal.  Cardiovascular:     Rate and Rhythm: Normal rate and regular rhythm.     Heart sounds: Normal heart sounds.  Pulmonary:     Effort: Pulmonary effort is normal.     Breath sounds: Normal breath sounds.  Abdominal:     General: Bowel sounds are normal.     Palpations: Abdomen is soft.  Musculoskeletal:     Cervical back: Normal range of motion.  Skin:    General: Skin is warm and dry.     Capillary Refill: Capillary refill takes less than 2 seconds.  Neurological:     Mental Status: She is alert and oriented to person, place, and time.  Psychiatric:        Behavior: Behavior normal.      Musculoskeletal Exam: ***  CDAI Exam: CDAI Score: -- Patient Global: --; Provider Global: -- Swollen: --; Tender: -- Joint Exam 04/07/2022   No joint exam has been documented for this visit   There is currently no information documented on the homunculus. Go to the Rheumatology activity and complete the homunculus joint exam.  Investigation: No additional findings.  Imaging: No results found.  Recent Labs: Lab Results  Component Value Date   WBC 5.4 04/05/2022   HGB 13.9 04/05/2022   PLT 295 04/05/2022   NA 140 04/05/2022   K 4.7 04/05/2022   CL 104 04/05/2022   CO2 28 04/05/2022   GLUCOSE 163 (H) 04/05/2022   BUN 15 04/05/2022   CREATININE 0.85 04/05/2022   BILITOT 0.4  04/05/2022   ALKPHOS 126 (H) 06/04/2021   AST 15 04/05/2022   ALT 16 04/05/2022   PROT 7.0 04/05/2022   ALBUMIN 4.4 06/04/2021   CALCIUM 9.9 04/05/2022   GFRAA 75 03/26/2020   QFTBGOLDPLUS NEGATIVE 07/18/2018    Speciality Comments: PLQ eye exam: 10/26/2021 WNL [facility]. Follow up in 1 year.  Procedures:  No procedures performed Allergies: Amoxicillin and Codeine   Assessment / Plan:     Visit Diagnoses: Rheumatoid arthritis involving multiple sites with positive rheumatoid factor (HCC)  High risk medication use  Idiopathic chronic gout of multiple sites without tophus  Primary osteoarthritis of both hands  Contracture of right elbow  Trochanteric bursitis, left hip  Primary osteoarthritis of both knees  Primary osteoarthritis of both feet  DDD (degenerative disc disease), cervical  DDD (degenerative disc disease), lumbar  Stage 2 chronic kidney disease  History of type 2 diabetes mellitus  History of hyperlipidemia  Essential hypertension  Orders: No orders of the defined types were placed in this encounter.  No orders of the defined types were  placed in this encounter.   Follow-Up Instructions: No follow-ups on file.   Ofilia Neas, PA-C  Note - This record has been created using Dragon software.  Chart creation errors have been sought, but may not always  have been located. Such creation errors do not reflect on  the standard of medical care.

## 2022-04-06 NOTE — Telephone Encounter (Signed)
F/U appt 04/07/2022

## 2022-04-06 NOTE — Telephone Encounter (Signed)
Attempted to contact the patient and left message for patient to call the office.  

## 2022-04-07 ENCOUNTER — Ambulatory Visit: Payer: BC Managed Care – PPO | Attending: Physician Assistant | Admitting: Physician Assistant

## 2022-04-07 ENCOUNTER — Encounter: Payer: Self-pay | Admitting: Physician Assistant

## 2022-04-07 VITALS — BP 137/87 | HR 84 | Resp 14 | Ht 65.0 in | Wt 219.6 lb

## 2022-04-07 DIAGNOSIS — M5030 Other cervical disc degeneration, unspecified cervical region: Secondary | ICD-10-CM

## 2022-04-07 DIAGNOSIS — M19041 Primary osteoarthritis, right hand: Secondary | ICD-10-CM

## 2022-04-07 DIAGNOSIS — M7062 Trochanteric bursitis, left hip: Secondary | ICD-10-CM

## 2022-04-07 DIAGNOSIS — M1A09X0 Idiopathic chronic gout, multiple sites, without tophus (tophi): Secondary | ICD-10-CM

## 2022-04-07 DIAGNOSIS — M19042 Primary osteoarthritis, left hand: Secondary | ICD-10-CM

## 2022-04-07 DIAGNOSIS — I10 Essential (primary) hypertension: Secondary | ICD-10-CM

## 2022-04-07 DIAGNOSIS — Z8639 Personal history of other endocrine, nutritional and metabolic disease: Secondary | ICD-10-CM

## 2022-04-07 DIAGNOSIS — N182 Chronic kidney disease, stage 2 (mild): Secondary | ICD-10-CM

## 2022-04-07 DIAGNOSIS — Z79899 Other long term (current) drug therapy: Secondary | ICD-10-CM

## 2022-04-07 DIAGNOSIS — M170 Bilateral primary osteoarthritis of knee: Secondary | ICD-10-CM

## 2022-04-07 DIAGNOSIS — M0579 Rheumatoid arthritis with rheumatoid factor of multiple sites without organ or systems involvement: Secondary | ICD-10-CM

## 2022-04-07 DIAGNOSIS — M24521 Contracture, right elbow: Secondary | ICD-10-CM

## 2022-04-07 DIAGNOSIS — M19072 Primary osteoarthritis, left ankle and foot: Secondary | ICD-10-CM

## 2022-04-07 DIAGNOSIS — M19071 Primary osteoarthritis, right ankle and foot: Secondary | ICD-10-CM

## 2022-04-07 DIAGNOSIS — M5136 Other intervertebral disc degeneration, lumbar region: Secondary | ICD-10-CM

## 2022-04-07 MED ORDER — COLCHICINE 0.6 MG PO TABS: 0.6000 mg | ORAL_TABLET | Freq: Every day | ORAL | 5 refills | Status: DC

## 2022-04-07 MED ORDER — ALLOPURINOL 300 MG PO TABS: 300.0000 mg | ORAL_TABLET | Freq: Every day | ORAL | 4 refills | Status: DC

## 2022-04-07 MED ORDER — ALLOPURINOL 100 MG PO TABS: ORAL_TABLET | ORAL | 0 refills | Status: DC

## 2022-04-07 NOTE — Progress Notes (Signed)
Pharmacy Note   Subjective:  Patient presents today to the [facility] for follow up office visit.  Patient was prescribed allopurinol for gout.  Patient was seen by the pharmacist for counseling on allopurinol and colchicine. Patient reports that she does not drink and limits her red meat and shellfish.    Objective: CBC    Component Value Date/Time   WBC 5.4 04/05/2022 0922   RBC 5.18 (H) 04/05/2022 0922   HGB 13.9 04/05/2022 0922   HGB 14.6 01/20/2021 1506   HCT 43.4 04/05/2022 0922   HCT 45.2 01/20/2021 1506   PLT 295 04/05/2022 0922   PLT 314 01/20/2021 1506   MCV 83.8 04/05/2022 0922   MCV 83 01/20/2021 1506   MCH 26.8 (L) 04/05/2022 0922   MCHC 32.0 04/05/2022 0922   RDW 13.5 04/05/2022 0922   RDW 14.4 01/20/2021 1506   LYMPHSABS 2,214 04/05/2022 0922   MONOABS 0.5 07/26/2019 1122   EOSABS 189 04/05/2022 0922   BASOSABS 59 04/05/2022 0922    CMP     Component Value Date/Time   NA 140 04/05/2022 0922   NA 141 01/18/2022 1703   K 4.7 04/05/2022 0922   CL 104 04/05/2022 0922   CO2 28 04/05/2022 0922   GLUCOSE 163 (H) 04/05/2022 0922   BUN 15 04/05/2022 0922   BUN 20 01/18/2022 1703   CREATININE 0.85 04/05/2022 0922   CALCIUM 9.9 04/05/2022 0922   PROT 7.0 04/05/2022 0922   PROT 7.1 06/04/2021 1537   ALBUMIN 4.4 06/04/2021 1537   AST 15 04/05/2022 0922   ALT 16 04/05/2022 0922   ALKPHOS 126 (H) 06/04/2021 1537   BILITOT 0.4 04/05/2022 0922   BILITOT 0.4 06/04/2021 1537   GFRNONAA 65 03/26/2020 1705   GFRNONAA 60 09/12/2018 1622   GFRAA 75 03/26/2020 1705   GFRAA 70 09/12/2018 1622    Uric Acid Lab Results  Component Value Date   LABURIC 7.6 (H) 04/05/2022      Assessment/Plan:  Counseled patient on the purpose proper use, and adverse effects of allopurinol and colchicine.  Discussed the importance of taking allopurinol and colchicine every day to lower uric acid levels.  The possibility of recurrent gout while lowering the uric acid was  explained and discussed the importance of taking colchicine daily at this time.  Provided patient with medication education material and answered all questions.     Patient will restart colchine 0.6 mg once daily x 4 days prior to initiating allopurinol. She was instructed to continue this medication until instructed otherwise from the office. She was given an estimated duration of therapy of 3-6 months.  Patient will initiate allopurinol 100 mg once daily x 7 days, then 200 mg once daily x 7 days, then 300 mg once daily thereafter.     Maryan Puls, PharmD PGY-1 [facility] Pharmacy Resident

## 2022-04-07 NOTE — Patient Instructions (Addendum)
Take colchicine 0.6 mg 1 tablet by mouth daily.   Start allopurinol 4 days AFTER starting on colchicine  Take allopurinol 100 mg by mouth daily x1 week, increase to 200 mg daily x1 week, and then increase to 300 mg daily  Return for lab work in 2 weeks x2    Allopurinol Tablets What is this medication? ALLOPURINOL (al oh PURE i nole) treats gout or kidney stones. It may also be used to prevent high uric acid levels after chemotherapy. It works by decreasing uric acid levels in your body. This medicine may be used for other purposes; ask your health care provider or pharmacist if you have questions. COMMON BRAND NAME(S): Zyloprim What should I tell my care team before I take this medication? They need to know if you have any of these conditions: Kidney disease Liver disease An unusual or allergic reaction to allopurinol, other medications, foods, dyes, or preservatives Pregnant or trying to get pregnant Breast-feeding How should I use this medication? Take this medication by mouth with a full glass of water. Take it as directed on the prescription label at the same time every day. You can take it with or without food. If it upsets your stomach, take it with food. Keep taking it unless your care team tells you to stop. Talk to your care team about the use of this medication in children. While this medication may be prescribed for children for selected conditions, precautions do apply. Overdosage: If you think you have taken too much of this medicine contact a poison control center or emergency room at once. NOTE: This medicine is only for you. Do not share this medicine with others. What if I miss a dose? If you miss a dose, take it as soon as you can. If it is almost time for your next dose, take only that dose. Do not take double or extra doses. What may interact with this medication? Do not take this medication with the following: Didanosine, ddI This medication may also interact with  the following: Certain antibiotics like amoxicillin, ampicillin Certain medications for cancer Certain medications for immunosuppression like azathioprine, cyclosporine, mercaptopurine Chlorpropamide Probenecid Sulfinpyrazone Thiazide diuretics, like hydrochlorothiazide Warfarin This list may not describe all possible interactions. Give your health care provider a list of all the medicines, herbs, non-prescription drugs, or dietary supplements you use. Also tell them if you smoke, drink alcohol, or use illegal drugs. Some items may interact with your medicine. What should I watch for while using this medication? Visit your care team for regular checks on your progress. If you are taking this medication to treat gout, you may not have less frequent attacks at first. Keep taking your medication regularly and the attacks should get better within 2 to 6 weeks. Drink plenty of water (10 to 12 full glasses a day) while you are taking this medication. This will help to reduce stomach upset and reduce the risk of getting gout or kidney stones. This medication may cause serious skin reactions. They can happen weeks to months after starting the medication. Contact your care team right away if you notice fevers or flu-like symptoms with a rash. The rash may be red or purple and then turn into blisters or peeling of the skin. You may also notice a red rash with swelling of the face, lips, or lymph nodes in your neck or under your arms. Do not take vitamin C without asking your care team. Too much vitamin C can increase the chance of  getting kidney stones. This medication may affect your coordination, reaction time, or judgment. Do not drive or operate machinery until you know how this medication affects you. Sit up or stand slowly to reduce the risk of dizzy or fainting spells. Drinking alcohol with this medication can increase the risk of these side effects. What side effects may I notice from receiving this  medication? Side effects that you should report to your care team as soon as possible: Allergic reactions--skin rash, itching, hives, swelling of the face, lips, tongue, or throat Kidney injury--decrease in the amount of urine, swelling of the ankles, hands, or feet Liver injury--right upper belly pain, loss of appetite, nausea, light-colored stool, dark yellow or brown urine, yellowing skin or eyes, unusual weakness or fatigue Rash, fever, and swollen lymph nodes Redness, blistering, peeling, or loosening of the skin, including inside the mouth Side effects that usually do not require medical attention (report to your care team if they continue or are bothersome): Diarrhea Drowsiness Nausea Vomiting This list may not describe all possible side effects. Call your doctor for medical advice about side effects. You may report side effects to FDA at 1-800-FDA-1088. Where should I keep my medication? Keep out of the reach of children and pets. Store at room temperature between 15 and 25 degrees C (59 and 77 degrees F). Protect from light and moisture. Throw away any unused medication after the expiration date. NOTE: This sheet is a summary. It may not cover all possible information. If you have questions about this medicine, talk to your doctor, pharmacist, or health care provider.  2023 Elsevier/Gold Standard (2007-03-18 00:00:00)   Information for patients with Gout  Gout defined-Gout occurs when urate crystals accumulate in your joint causing the inflammation and intense pain of gout attack.  Urate crystals can form when you have high levels of uric acid in your blood.  Your body produces uric acid when it breaks down prurines-substances that are found naturally in your body, as well as in certain foods such as organ meats, anchioves, herring, asparagus, and mushrooms.  Normally uric acid dissolves in your blood and passes through your kidneys into your urine.  But sometimes your body either  produces too much uric acid or your kidneys excrete too little uric acid.  When this happens, uric acid can build up, forming sharp needle-like urate crystals in a joint or surrounding tissue that cause pain, inflammation and swelling.    Gout is characterized by sudden, severe attacks of pain, redness and tenderness in joints, often the joint at the base of the big toe.  Gout is complex form of arthritis that can affect anyone.  Men are more likely to get gout but women become increasingly more susceptible to gout after menopause.  An acute attack of gout can wake you up in the middle of the night with the sensation that your big toe is on fire.  The affected joint is hot, swollen and so tender that even the weight or the sheet on it may seem intolerable.  If you experience symptoms of an acute gout attack it is important to your doctor as soon as the symptoms start.  Gout that goes untreated can lead to worsening pain and joint damage.  Risk Factors:  You are more likely to develop gout if you have high levels of uric acid in your body.    Factors that increase the uric acid level in your body include:  Lifestyle factors.  Excessive alcohol use-generally more than  two drinks a day for men and more than one for women increase the risk of gout.  Medical conditions.  Certain conditions make it more likely that you will develop gout.  These include hypertension, and chronic conditions such as diabetes, high levels of fat and cholesterol in the blood, and narrowing of the arteries.  Certain medications.  The uses of Thiazide diuretics- commonly used to treat hypertension and low dose aspirin can also increase uric acid levels.  Family history of gout.  If other members of your family have had gout, you are more likely to develop the disease.  Age and sex. Gout occurs more often in men than it does in women, primarily because women tend to have lower uric acid levels than men do.  Men are more  likely to develop gout earlier usually between the ages of 78-50- whereas women generally develop signs and symptoms after menopause.    Tests and diagnosis:  Tests to help diagnose gout may include:  Blood test.  Your doctor may recommend a blood test to measure the uric acid level in your blood .  Blood tests can be misleading, though.  Some people have high uric acid levels but never experience gout.  And some people have signs and symptoms of gout, but don't have unusual levels of uric acid in their blood.  Joint fluid test.  Your doctor may use a needle to draw fluid from your affected joint.  When examined under the microscope, your joint fluid may reveal urate crystals.  Treatment:  Treatment for gout usually involves medications.  What medications you and your doctor choose will be based on your current health and other medications you currently take.  Gout medications can be used to treat acute gout attacks and prevent future attacks as well as reduce your risk of complications from gout such as the development of tophi from urate crystal deposits.  Alternative medicine:   Certain foods have been studied for their potential to lower uric acid levels, including:  Coffee.  Studies have found an association between coffee drinking (regular and decaf) and lower uric acid levels.  The evidence is not enough to encourage non-coffee drinkers to start, but it may give clues to new ways of treating gout in the future.  Vitamin C.  Supplements containing vitamin C may reduce the levels of uric acid in your blood.  However, vitamin as a treatment for gout. Don't assume that if a little vitamin C is good, than lots is better.  Megadoses of vitamin C may increase your bodies uric acid levels.  Cherries.  Cherries have been associated with lower levels of uric acid in studies, but it isn't clear if they have any effect on gout signs and symptoms.  Eating more cherries and other dar-colored fruits,  such as blackberries, blueberries, purple grapes and raspberries, may be a safe way to support your gout treatment.    Lifestyle/Diet Recommendations:  Drink 8 to 16 cups ( about 2 to 4 liters) of fluid each day, with at least half being water. Avoid alcohol Eat a moderate amount of protein, preferably from healthy sources, such as low-fat or fat-free dairy, tofu, eggs, and nut butters. Limit you daily intake of meat, fish, and poultry to 4 to 6 ounces. Avoid high fat meats and desserts. Decrease you intake of shellfish, beef, lamb, pork, eggs and cheese. Choose a good source of vitamin C daily such as citrus fruits, strawberries, broccoli,  brussel sprouts, papaya, and cantaloupe.  Choose a good source of vitamin A every other day such as yellow fruits, or dark green/yellow vegetables. Avoid drastic weigh reduction or fasting.  If weigh loss is desired lose it over a period of several months. See "dietary considerations.." chart for specific food recommendations.  Dietary Considerations for people with Gout  Food with negligible purine content (0-15 mg of purine nitrogen per 100 grams food)  May use as desired except on calorie variations  Non fat milk Cocoa Cereals (except in list II) Hard candies  Buttermilk Carbonated drinks Vegetables (except in list II) Sherbet  Coffee Fruits Sugar Honey  Tea Cottage Cheese Gelatin-jell-o Salt  Fruit juice Breads Angel food Cake   Herbs/spices Jams/Jellies [employer]    Foods that do not contain excessive purine content, but must be limited due to fat content  Cream Eggs Oil and Salad Dressing  Half and Half Peanut Butter Chocolate  Whole Milk Cakes Potato Chips  Butter Ice Cream Fried Foods  Cheese Nuts Waffles, pancakes   List II: Food with moderate purine content (50-150 mg of purine nitrogen per 100 grams of food)  Limit total amount each day to 5 oz. cooked Lean meat, other than those on list III   Poultry, other than  those on list III Fish, other than those on list III   Seafood, other than those on list III  These foods may be used occasionally  Peas Lentils Bran  Spinach Oatmeal Dried Beans and Peas  Asparagus Wheat Germ Mushrooms   Additional information about meat choices  Choose fish and poultry, particularly without skin, often.  Select lean, well trimmed cuts of meat.  Avoid all fatty meats, bacon , sausage, fried meats, fried fish, or poultry, luncheon meats, cold cuts, hot dogs, meats canned or frozen in gravy, spareribs and frozen and packaged prepared meats.   List III: Foods with HIGH purine content / Foods to AVOID (150-800 mg of purine nitrogen per 100 grams of food)  Anchovies Herring Meat Broths  Liver Mackerel Meat Extracts  Kidney Scallops Meat Drippings  Sardines Wild Game Mincemeat  Sweetbreads Goose Gravy  Heart Tongue Yeast, baker's and brewers   Commercial soups made with any of the foods listed in List II or List III  In addition avoid all alcoholic beverages

## 2022-04-27 ENCOUNTER — Ambulatory Visit (INDEPENDENT_AMBULATORY_CARE_PROVIDER_SITE_OTHER): Payer: BC Managed Care – PPO | Admitting: Internal Medicine

## 2022-04-27 ENCOUNTER — Other Ambulatory Visit: Payer: Self-pay | Admitting: Internal Medicine

## 2022-04-27 ENCOUNTER — Encounter: Payer: Self-pay | Admitting: Internal Medicine

## 2022-04-27 VITALS — BP 130/84 | HR 85 | Temp 98.3°F | Ht 65.0 in | Wt 216.4 lb

## 2022-04-27 DIAGNOSIS — H6122 Impacted cerumen, left ear: Secondary | ICD-10-CM

## 2022-04-27 DIAGNOSIS — E1122 Type 2 diabetes mellitus with diabetic chronic kidney disease: Secondary | ICD-10-CM

## 2022-04-27 DIAGNOSIS — I129 Hypertensive chronic kidney disease with stage 1 through stage 4 chronic kidney disease, or unspecified chronic kidney disease: Secondary | ICD-10-CM

## 2022-04-27 DIAGNOSIS — Z6836 Body mass index (BMI) 36.0-36.9, adult: Secondary | ICD-10-CM

## 2022-04-27 DIAGNOSIS — N182 Chronic kidney disease, stage 2 (mild): Secondary | ICD-10-CM | POA: Diagnosis not present

## 2022-04-27 NOTE — Progress Notes (Signed)
Subjective:     Patient ID: Ashley Monroe , female    DOB: 07/07/1955 , 67 y.o.   MRN: [id number]   Chief Complaint  Patient presents with   Annual Exam   Diabetes   Hypertension    HPI  Patient is here for diabetes and blood pressure f/u. She was initially scheduled for a full physical exam; however, she now has Medicare. She retired 2/29. However, it appears Medicare is not active, so WTM was not performed. She reports compliance with meds. She denies headaches, chest pain and shortness of breath. She admits that she is not exercising as much as she should.    Diabetes She presents for her follow-up diabetic visit. She has type 2 diabetes mellitus. Her disease course has been stable. Pertinent negatives for diabetes include no blurred vision and no chest pain. There are no hypoglycemic complications. Risk factors for coronary artery disease include diabetes mellitus, dyslipidemia, hypertension, obesity and sedentary lifestyle. She is following a generally healthy diet. She participates in exercise intermittently. Her breakfast blood glucose is taken between 7-8 am. Her breakfast blood glucose range is generally 130-140 mg/dl. An ACE inhibitor/angiotensin II receptor blocker is being taken. Eye exam is current.  Hypertension This is a chronic problem. The current episode started more than 1 year ago. The problem has been gradually improving since onset. The problem is controlled. Pertinent negatives include no blurred vision, chest pain, palpitations or shortness of breath.     Past Medical History:  Diagnosis Date   Allergy    Arthritis    RA   Carpal tunnel syndrome    Chronic kidney disease    Diabetes mellitus without complication ([facility])    Essential hypertension 06/01/2019   Hyperlipidemia 08/19/2016   Hypertension    PONV (postoperative nausea and vomiting)      Family History  Problem Relation Age of Onset   Atrial fibrillation Mother    Stroke Mother    Heart  attack Mother    Aneurysm Mother    Atrial fibrillation Brother    Stroke Brother    Diabetes Brother    Diabetes Father    Alzheimer's disease Father    Diabetes Brother    Alzheimer's disease Maternal Grandmother    Heart attack Maternal Grandfather    Cancer Paternal Grandmother    Healthy Son    Healthy Daughter      Current Outpatient Medications:    Accu-Chek FastClix Lancets MISC, Use as instructed to check blood sugars 2 times per day dx: E11.22, Disp: 300 each, Rfl: 2   ACCU-CHEK GUIDE test strip, USE AS INSTRUCTED TO CHECK BLOOD SUGARS 2 TIMES PER DAY, Disp: 200 strip, Rfl: 4   allopurinol (ZYLOPRIM) 300 MG tablet, Take 1 tablet (300 mg total) by mouth daily. Month 2 and onwards, Disp: 30 tablet, Rfl: 4   amLODipine (NORVASC) 10 MG tablet, TAKE 1 TABLET BY MOUTH EVERY DAY, Disp: 90 tablet, Rfl: 2   aspirin EC 81 MG tablet, Take 81 mg by mouth daily., Disp: , Rfl:    cetirizine (ZYRTEC) 10 MG tablet, Take 10 mg by mouth daily., Disp: , Rfl:    colchicine 0.6 MG tablet, Take 1 tablet (0.6 mg total) by mouth daily., Disp: 30 tablet, Rfl: 5   cyclobenzaprine (FLEXERIL) 10 MG tablet, Take 10 mg by mouth at bedtime as needed for muscle spasms., Disp: , Rfl: 0   DULoxetine (CYMBALTA) 30 MG capsule, TAKE 1 CAPSULE BY MOUTH EVERY DAY,  Disp: 90 capsule, Rfl: 1   gabapentin (NEURONTIN) 300 MG capsule, TAKE 1 CAPSULE BY MOUTH THREE TIMES A DAY, Disp: , Rfl:    HYDROcodone-acetaminophen (NORCO) 10-325 MG tablet, TAKE 1 TABLET 3 TIMES A DAY BY ORAL ROUTE AS NEEDED., Disp: , Rfl:    hydroxychloroquine (PLAQUENIL) 200 MG tablet, TAKE 1 TABLET BY MOUTH TWICE A DAY, Disp: 180 tablet, Rfl: 0   insulin degludec (TRESIBA FLEXTOUCH) 200 UNIT/ML FlexTouch Pen, Inject 20 Units into the skin at bedtime., Disp: 15 mL, Rfl: 3   Insulin Pen Needle (PEN NEEDLES) 32G X 4 MM MISC, Use as directed with Levemir, Disp: 150 each, Rfl: 2   metFORMIN (GLUCOPHAGE) 500 MG tablet, TAKE 1 TABLET BY MOUTH 2 TIMES  DAILY WITH A MEAL., Disp: 180 tablet, Rfl: 2   olmesartan-hydrochlorothiazide (BENICAR HCT) 40-25 MG tablet, TAKE 1 TABLET BY MOUTH EVERY DAY, Disp: 90 tablet, Rfl: 1   allopurinol (ZYLOPRIM) 100 MG tablet, Month 1: START after 4 days of COLCHICINE: Take 1 tab by mouth once daily x 7 days, then 2 tabs daily x 7 days, then 3 tabs daily thereafter. (Patient not taking: Reported on 04/27/2022), Disp: 69 tablet, Rfl: 0   Semaglutide, 1 MG/DOSE, 4 MG/3ML SOPN, Inject 1 mg into the skin once a week., Disp: 9 mL, Rfl: 3   Allergies  Allergen Reactions   Amoxicillin Hives   Codeine Hives     Review of Systems  Constitutional: Negative.   Eyes:  Negative for blurred vision.  Respiratory: Negative.  Negative for shortness of breath.   Cardiovascular: Negative.  Negative for chest pain and palpitations.  Gastrointestinal: Negative.   Skin: Negative.   Psychiatric/Behavioral: Negative.       Today's Vitals   04/27/22 1406  BP: 130/84  Pulse: 85  Temp: 98.3 F (36.8 C)  SpO2: 98%  Weight: 216 lb 6.4 oz (98.2 kg)  Height: 5\' 5"  (1.651 m)   Body mass index is 36.01 kg/m.   Objective:  Physical Exam Vitals and nursing note reviewed.  Constitutional:      Appearance: Normal appearance.  HENT:     Head: Normocephalic and atraumatic.     Right Ear: Tympanic membrane, ear canal and external ear normal. There is no impacted cerumen.     Left Ear: Ear canal and external ear normal. There is impacted cerumen.     Nose:     Comments: Masked     Mouth/Throat:     Comments: Masked  Eyes:     Extraocular Movements: Extraocular movements intact.  Cardiovascular:     Rate and Rhythm: Normal rate and regular rhythm.     Heart sounds: Normal heart sounds.  Pulmonary:     Effort: Pulmonary effort is normal.     Breath sounds: Normal breath sounds.  Musculoskeletal:     Cervical back: Normal range of motion.  Skin:    General: Skin is warm.  Neurological:     General: No focal deficit  present.     Mental Status: She is alert.  Psychiatric:        Mood and Affect: Mood normal.        Behavior: Behavior normal.         Assessment And Plan:     1. Type 2 diabetes mellitus with stage 2 chronic kidney disease, without long-term current use of insulin (HCC) Comments: Chronic, I will check labs as below. She will rto in 3 months for WTM.  She will c/w  Tresiba 20 units nightly, semaglutide weekly and metformin. - Hemoglobin A1c - Lipid panel - TSH - Amb Referral To Provider Referral Exercise Program (P.R.E.P) - Microalbumin / creatinine urine ratio  2. Hypertensive nephropathy Comments: Chronic, fair control.  She will c/w olmesartan /hct 40/25mg  and amlodipine 10mg  daily. encouraged to follow low sodium diet. - Amb Referral To Provider Referral Exercise Program (P.R.E.P)  3. Impacted cerumen of left ear Comments: After obtaining verbal consent, left ear was irrigated w/o complications. No TM abnormalities were noted. - Ear Lavage  4. Class 2 severe obesity due to excess calories with serious comorbidity and body mass index (BMI) of 36.0 to 36.9 in adult [facility]) Comments: She is encouraged to incorporate more exercise into her daily routine. Agrees to PREP referral. - Amb Referral To Provider Referral Exercise Program (P.R.E.P)   Patient was given opportunity to ask questions. Patient verbalized understanding of the plan and was able to repeat key elements of the plan. All questions were answered to their satisfaction.   I, Maximino Greenland, MD, have reviewed all documentation for this visit. The documentation on 04/27/22 for the exam, diagnosis, procedures, and orders are all accurate and complete.   IF YOU HAVE BEEN REFERRED TO A SPECIALIST, IT MAY TAKE 1-2 WEEKS TO SCHEDULE/PROCESS THE REFERRAL. IF YOU HAVE NOT HEARD FROM US/SPECIALIST IN TWO WEEKS, PLEASE GIVE [place] A CALL AT [phone] X [phone].   THE PATIENT IS ENCOURAGED TO PRACTICE SOCIAL DISTANCING DUE TO THE  COVID-19 PANDEMIC.

## 2022-04-27 NOTE — Patient Instructions (Addendum)
Ms. Ashley Monroe , Thank you for taking time to come for your Medicare Wellness Visit. I appreciate your ongoing commitment to your health goals. Please review the following plan we discussed and let me know if I can assist you in the future.   These are the goals we discussed:  Goals   None     This is a list of the screening recommended for you and due dates:  Health Maintenance  Topic Date Due   Complete foot exam   01/09/2021   Yearly kidney health urinalysis for diabetes  01/20/2022   COVID-19 Vaccine (6 - 2023-24 season) 02/22/2022   DTaP/Tdap/Td vaccine (3 - Td or Tdap) 05/03/2022   Hemoglobin A1C  07/20/2022   Eye exam for diabetics  10/27/2022   Mammogram  12/10/2022   Yearly kidney function blood test for diabetes  04/06/2023   Colon Cancer Screening  05/10/2023   Pneumonia Vaccine  Completed   Flu Shot  Completed   DEXA scan (bone density measurement)  Completed   Hepatitis C Screening: USPSTF Recommendation to screen - Ages 23-79 yo.  Completed   HIV Screening  Completed   Zoster (Shingles) Vaccine  Completed   HPV Vaccine  Aged Out   Pap Smear  Discontinued     Health Maintenance, Female Adopting a healthy lifestyle and getting preventive care are important in promoting health and wellness. Ask your health care provider about: The right schedule for you to have regular tests and exams. Things you can do on your own to prevent diseases and keep yourself healthy. What should I know about diet, weight, and exercise? Eat a healthy diet  Eat a diet that includes plenty of vegetables, fruits, low-fat dairy products, and lean protein. Do not eat a lot of foods that are high in solid fats, added sugars, or sodium. Maintain a healthy weight Body mass index (BMI) is used to identify weight problems. It estimates body fat based on height and weight. Your health care provider can help determine your BMI and help you achieve or maintain a healthy weight. Get regular exercise Get  regular exercise. This is one of the most important things you can do for your health. Most adults should: Exercise for at least 150 minutes each week. The exercise should increase your heart rate and make you sweat (moderate-intensity exercise). Do strengthening exercises at least twice a week. This is in addition to the moderate-intensity exercise. Spend less time sitting. Even light physical activity can be beneficial. Watch cholesterol and blood lipids Have your blood tested for lipids and cholesterol at 66 years of age, then have this test every 5 years. Have your cholesterol levels checked more often if: Your lipid or cholesterol levels are high. You are older than 66 years of age. You are at high risk for heart disease. What should I know about cancer screening? Depending on your health history and family history, you may need to have cancer screening at various ages. This may include screening for: Breast cancer. Cervical cancer. Colorectal cancer. Skin cancer. Lung cancer. What should I know about heart disease, diabetes, and high blood pressure? Blood pressure and heart disease High blood pressure causes heart disease and increases the risk of stroke. This is more likely to develop in people who have high blood pressure readings or are overweight. Have your blood pressure checked: Every 3-5 years if you are 14-50 years of age. Every year if you are 50 years old or older. Diabetes Have regular diabetes screenings.  This checks your fasting blood sugar level. Have the screening done: Once every three years after age 52 if you are at a normal weight and have a low risk for diabetes. More often and at a younger age if you are overweight or have a high risk for diabetes. What should I know about preventing infection? Hepatitis B If you have a higher risk for hepatitis B, you should be screened for this virus. Talk with your health care provider to find out if you are at risk for  hepatitis B infection. Hepatitis C Testing is recommended for: Everyone born from [birth year] through 1965. Anyone with known risk factors for hepatitis C. Sexually transmitted infections (STIs) Get screened for STIs, including gonorrhea and chlamydia, if: You are sexually active and are younger than 66 years of age. You are older than 66 years of age and your health care provider tells you that you are at risk for this type of infection. Your sexual activity has changed since you were last screened, and you are at increased risk for chlamydia or gonorrhea. Ask your health care provider if you are at risk. Ask your health care provider about whether you are at high risk for HIV. Your health care provider may recommend a prescription medicine to help prevent HIV infection. If you choose to take medicine to prevent HIV, you should first get tested for HIV. You should then be tested every 3 months for as long as you are taking the medicine. Pregnancy If you are about to stop having your period (premenopausal) and you may become pregnant, seek counseling before you get pregnant. Take 400 to 800 micrograms (mcg) of folic acid every day if you become pregnant. Ask for birth control (contraception) if you want to prevent pregnancy. Osteoporosis and menopause Osteoporosis is a disease in which the bones lose minerals and strength with aging. This can result in bone fractures. If you are 81 years old or older, or if you are at risk for osteoporosis and fractures, ask your health care provider if you should: Be screened for bone loss. Take a calcium or vitamin D supplement to lower your risk of fractures. Be given hormone replacement therapy (HRT) to treat symptoms of menopause. Follow these instructions at home: Alcohol use Do not drink alcohol if: Your health care provider tells you not to drink. You are pregnant, may be pregnant, or are planning to become pregnant. If you drink alcohol: Limit how much  you have to: 0-1 drink a day. Know how much alcohol is in your drink. In the [place] one drink equals one 12 oz bottle of beer (355 mL), one 5 oz glass of wine (148 mL), or one 1 oz glass of hard liquor (44 mL). Lifestyle Do not use any products that contain nicotine or tobacco. These products include cigarettes, chewing tobacco, and vaping devices, such as e-cigarettes. If you need help quitting, ask your health care provider. Do not use street drugs. Do not share needles. Ask your health care provider for help if you need support or information about quitting drugs. General instructions Schedule regular health, dental, and eye exams. Stay current with your vaccines. Tell your health care provider if: You often feel depressed. You have ever been abused or do not feel safe at home. Summary Adopting a healthy lifestyle and getting preventive care are important in promoting health and wellness. Follow your health care provider's instructions about healthy diet, exercising, and getting tested or screened for diseases. Follow your health care  provider's instructions on monitoring your cholesterol and blood pressure. This information is not intended to replace advice given to you by your health care provider. Make sure you discuss any questions you have with your health care provider. Document Revised: 06/16/2020 Document Reviewed: 06/16/2020 Elsevier Patient Education  [employer].

## 2022-04-28 LAB — MICROALBUMIN / CREATININE URINE RATIO
Creatinine, Urine: 249.5 mg/dL
Microalb/Creat Ratio: 8 mg/g creat (ref 0–29)
Microalbumin, Urine: 20.8 ug/mL

## 2022-04-28 LAB — LIPID PANEL
Chol/HDL Ratio: 4.8 ratio — ABNORMAL HIGH (ref 0.0–4.4)
Cholesterol, Total: 215 mg/dL — ABNORMAL HIGH (ref 100–199)
HDL: 45 mg/dL (ref >39)
LDL Chol Calc (NIH): 131 mg/dL — ABNORMAL HIGH (ref 0–99)
Triglycerides: 220 mg/dL — ABNORMAL HIGH (ref 0–149)
VLDL Cholesterol Cal: 39 mg/dL (ref 5–40)

## 2022-04-28 LAB — HEMOGLOBIN A1C
Est. average glucose Bld gHb Est-mCnc: 180 mg/dL
Hgb A1c MFr Bld: 7.9 % — ABNORMAL HIGH (ref 4.8–5.6)

## 2022-04-28 LAB — TSH: TSH: 0.928 u[IU]/mL (ref 0.450–4.500)

## 2022-04-29 ENCOUNTER — Other Ambulatory Visit: Payer: Self-pay | Admitting: Physician Assistant

## 2022-04-29 ENCOUNTER — Other Ambulatory Visit: Payer: Self-pay | Admitting: Internal Medicine

## 2022-04-29 DIAGNOSIS — M1A09X0 Idiopathic chronic gout, multiple sites, without tophus (tophi): Secondary | ICD-10-CM

## 2022-04-29 MED ORDER — SEMAGLUTIDE (1 MG/DOSE) 4 MG/3ML ~~LOC~~ SOPN: 1.0000 mg | PEN_INJECTOR | SUBCUTANEOUS | 3 refills | Status: DC

## 2022-05-05 NOTE — Progress Notes (Unsigned)
Office Visit Note  Patient: Ashley Monroe             Date of Birth: 02-26-1955           MRN: [id number]             PCP: Dorothyann Peng, MD Referring: Dorothyann Peng, MD Visit Date: 05/19/2022 Occupation: @GUAROCC @  Subjective:  Medication monitoring   History of Present Illness: Ashley Monroe is a 67 y.o. female with history of seropositive rheumatoid arthritis and gout.  Patient is currently taking Plaquenil 200 mg 1 tablet by mouth twice daily.   CBC and CMP were updated on 04/05/2022.  Orders for CBC and CMP were released today. PLQ eye exam: 10/26/2021 WNL [facility]. Follow up in 1 year.    Uric acid level was 7.6 on 04/05/2022.  Patient was started on allopurinol and colchicine at her last office visit.   Activities of Daily Living:  Patient reports morning stiffness for *** {minute/hour:19697}.   Patient {ACTIONS;DENIES/REPORTS:21021675::"Denies"} nocturnal pain.  Difficulty dressing/grooming: {ACTIONS;DENIES/REPORTS:21021675::"Denies"} Difficulty climbing stairs: {ACTIONS;DENIES/REPORTS:21021675::"Denies"} Difficulty getting out of chair: {ACTIONS;DENIES/REPORTS:21021675::"Denies"} Difficulty using hands for taps, buttons, cutlery, and/or writing: {ACTIONS;DENIES/REPORTS:21021675::"Denies"}  No Rheumatology ROS completed.   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   Essential hypertension 06/01/2019   Rheumatoid arthritis involving multiple sites with positive rheumatoid factor (HCC) 04/25/2019   Class 2 severe obesity due to excess calories with serious comorbidity and body mass index (BMI) of 36.0 to 36.9 in adult (HCC) 04/25/2019   DDD (degenerative disc disease), cervical 03/15/2019    Carpal tunnel syndrome of right wrist 12/08/2018   Cervical post-laminectomy syndrome 08/28/2018   Stenosis of cervical spine with myelopathy (HCC) 08/23/2017   Hyperlipidemia 08/19/2016   Chest pain 08/12/2016   Chronic kidney disease, stage 2 (mild) 08/12/2016   Hypertensive nephropathy 08/12/2016   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 08/12/2016   Other long term (current) drug therapy 08/12/2016   Type 2 diabetes mellitus with diabetic chronic kidney disease (HCC) 08/12/2016    Past Medical History:  Diagnosis Date   Allergy    Arthritis    RA   Carpal tunnel syndrome    Chronic kidney disease    Diabetes mellitus without complication (HCC)    Essential hypertension 06/01/2019   Hyperlipidemia 08/19/2016   Hypertension    PONV (postoperative nausea and vomiting)     Family History  Problem Relation Age of Onset   Atrial fibrillation Mother    Stroke Mother    Heart attack Mother    Aneurysm Mother    Atrial fibrillation Brother    Stroke Brother    Diabetes Brother    Diabetes Father    Alzheimer's disease Father    Diabetes Brother    Alzheimer's disease Maternal Grandmother    Heart attack Maternal Grandfather    Cancer Paternal Grandmother    Healthy Son    Healthy Daughter    Past Surgical History:  Procedure Laterality Date   ABDOMINAL HYSTERECTOMY  2003   ANTERIOR CERVICAL DECOMP/DISCECTOMY FUSION N/A 07/30/2019   Procedure: Anterior Cervicql Decompression Fusion - Cervcial four-Cervical five - Cervical five-Cervical six;  Surgeon: Tia Alert, MD;  Location: [facility] OR;  Service: Neurosurgery;  Laterality:  N/A;  3C   CERVICAL SPINE SURGERY  08/2017   CHOLECYSTECTOMY  1982   OVARIAN CYST REMOVAL  1981   Social History   Social History Narrative   Not on file   Immunization History  Administered Date(s) Administered   DTaP 05/02/2012   Influenza,inj,Quad PF,6+ Mos 11/10/2018, 10/22/2019, 11/07/2020   Influenza-Unspecified 11/30/2017,  11/10/2018, 12/28/2021   PFIZER(Purple Top)SARS-COV-2 Vaccination 04/14/2019, 05/05/2019, 11/24/2019   PNEUMOCOCCAL CONJUGATE-20 01/18/2022   Pfizer Covid-19 Vaccine Bivalent Booster 19yrs & up 11/27/2020, 12/28/2021   Tdap 05/01/2012   Zoster Recombinat (Shingrix) 12/01/2016, 11/27/2020     Objective: Vital Signs: There were no vitals taken for this visit.   Physical Exam Vitals and nursing note reviewed.  Constitutional:      Appearance: She is well-developed.  HENT:     Head: Normocephalic and atraumatic.  Eyes:     Conjunctiva/sclera: Conjunctivae normal.  Cardiovascular:     Rate and Rhythm: Normal rate and regular rhythm.     Heart sounds: Normal heart sounds.  Pulmonary:     Effort: Pulmonary effort is normal.     Breath sounds: Normal breath sounds.  Abdominal:     General: Bowel sounds are normal.     Palpations: Abdomen is soft.  Musculoskeletal:     Cervical back: Normal range of motion.  Lymphadenopathy:     Cervical: No cervical adenopathy.  Skin:    General: Skin is warm and dry.     Capillary Refill: Capillary refill takes less than 2 seconds.  Neurological:     Mental Status: She is alert and oriented to person, place, and time.  Psychiatric:        Behavior: Behavior normal.      Musculoskeletal Exam: ***  CDAI Exam: CDAI Score: -- Patient Global: --; Provider Global: -- Swollen: --; Tender: -- Joint Exam 05/19/2022   No joint exam has been documented for this visit   There is currently no information documented on the homunculus. Go to the Rheumatology activity and complete the homunculus joint exam.  Investigation: No additional findings.  Imaging: No results found.  Recent Labs: Lab Results  Component Value Date   WBC 5.4 04/05/2022   HGB 13.9 04/05/2022   PLT 295 04/05/2022   NA 140 04/05/2022   K 4.7 04/05/2022   CL 104 04/05/2022   CO2 28 04/05/2022   GLUCOSE 163 (H) 04/05/2022   BUN 15 04/05/2022   CREATININE 0.85  04/05/2022   BILITOT 0.4 04/05/2022   ALKPHOS 126 (H) 06/04/2021   AST 15 04/05/2022   ALT 16 04/05/2022   PROT 7.0 04/05/2022   ALBUMIN 4.4 06/04/2021   CALCIUM 9.9 04/05/2022   GFRAA 75 03/26/2020   QFTBGOLDPLUS NEGATIVE 07/18/2018    Speciality Comments: PLQ eye exam: 10/26/2021 WNL [facility]. Follow up in 1 year.  Procedures:  No procedures performed Allergies: Amoxicillin and Codeine   Assessment / Plan:     Visit Diagnoses: Rheumatoid arthritis involving multiple sites with positive rheumatoid factor (HCC)  High risk medication use  Idiopathic chronic gout of multiple sites without tophus  Primary osteoarthritis of both hands  Contracture of right elbow  Trochanteric bursitis, left hip  Primary osteoarthritis of both knees  Primary osteoarthritis of both feet  DDD (degenerative disc disease), cervical  DDD (degenerative disc disease), lumbar  Stage 2 chronic kidney disease  History of type 2 diabetes mellitus  History of hyperlipidemia  Essential hypertension  Orders: No orders of the defined types were  placed in this encounter.  No orders of the defined types were placed in this encounter.   Face-to-face time spent with patient was *** minutes. Greater than 50% of time was spent in counseling and coordination of care.  Follow-Up Instructions: No follow-ups on file.   Ofilia Neas, PA-C  Note - This record has been created using Dragon software.  Chart creation errors have been sought, but may not always  have been located. Such creation errors do not reflect on  the standard of medical care.

## 2022-05-17 ENCOUNTER — Other Ambulatory Visit: Payer: Self-pay | Admitting: Physician Assistant

## 2022-05-17 NOTE — Telephone Encounter (Signed)
Last Fill: 02/12/2022  Eye exam: 10/26/2021 WNL    Labs: 04/05/2022 CBC is normal, glucose is elevated.   Next Visit: 05/19/2022  Last Visit: 04/07/2022  DX: Rheumatoid arthritis involving multiple sites with positive rheumatoid factor   Current Dose per office note 04/07/2022: Plaquenil 200 mg 1 tablet by mouth twice daily.   Okay to refill Plaquenil?

## 2022-05-19 ENCOUNTER — Encounter: Payer: Self-pay | Admitting: Physician Assistant

## 2022-05-19 ENCOUNTER — Ambulatory Visit: Payer: BC Managed Care – PPO | Attending: Physician Assistant | Admitting: Physician Assistant

## 2022-05-19 VITALS — BP 111/78 | HR 81 | Resp 16 | Ht 65.0 in | Wt 218.0 lb

## 2022-05-19 DIAGNOSIS — M19042 Primary osteoarthritis, left hand: Secondary | ICD-10-CM

## 2022-05-19 DIAGNOSIS — Z79899 Other long term (current) drug therapy: Secondary | ICD-10-CM

## 2022-05-19 DIAGNOSIS — M7062 Trochanteric bursitis, left hip: Secondary | ICD-10-CM | POA: Diagnosis present

## 2022-05-19 DIAGNOSIS — Z8639 Personal history of other endocrine, nutritional and metabolic disease: Secondary | ICD-10-CM | POA: Diagnosis present

## 2022-05-19 DIAGNOSIS — M19041 Primary osteoarthritis, right hand: Secondary | ICD-10-CM | POA: Diagnosis present

## 2022-05-19 DIAGNOSIS — M51369 Other intervertebral disc degeneration, lumbar region without mention of lumbar back pain or lower extremity pain: Secondary | ICD-10-CM

## 2022-05-19 DIAGNOSIS — M5030 Other cervical disc degeneration, unspecified cervical region: Secondary | ICD-10-CM | POA: Diagnosis present

## 2022-05-19 DIAGNOSIS — M5136 Other intervertebral disc degeneration, lumbar region: Secondary | ICD-10-CM | POA: Diagnosis present

## 2022-05-19 DIAGNOSIS — M0579 Rheumatoid arthritis with rheumatoid factor of multiple sites without organ or systems involvement: Secondary | ICD-10-CM

## 2022-05-19 DIAGNOSIS — M19071 Primary osteoarthritis, right ankle and foot: Secondary | ICD-10-CM

## 2022-05-19 DIAGNOSIS — N182 Chronic kidney disease, stage 2 (mild): Secondary | ICD-10-CM | POA: Diagnosis present

## 2022-05-19 DIAGNOSIS — M19072 Primary osteoarthritis, left ankle and foot: Secondary | ICD-10-CM | POA: Diagnosis present

## 2022-05-19 DIAGNOSIS — I10 Essential (primary) hypertension: Secondary | ICD-10-CM | POA: Diagnosis present

## 2022-05-19 DIAGNOSIS — M170 Bilateral primary osteoarthritis of knee: Secondary | ICD-10-CM

## 2022-05-19 DIAGNOSIS — M24521 Contracture, right elbow: Secondary | ICD-10-CM

## 2022-05-19 DIAGNOSIS — M1A09X0 Idiopathic chronic gout, multiple sites, without tophus (tophi): Secondary | ICD-10-CM | POA: Diagnosis present

## 2022-05-19 NOTE — Patient Instructions (Signed)
Information for patients with Gout  Gout defined-Gout occurs when urate crystals accumulate in your joint causing the inflammation and intense pain of gout attack.  Urate crystals can form when you have high levels of uric acid in your blood.  Your body produces uric acid when it breaks down prurines-substances that are found naturally in your body, as well as in certain foods such as organ meats, anchioves, herring, asparagus, and mushrooms.  Normally uric acid dissolves in your blood and passes through your kidneys into your urine.  But sometimes your body either produces too much uric acid or your kidneys excrete too little uric acid.  When this happens, uric acid can build up, forming sharp needle-like urate crystals in a joint or surrounding tissue that cause pain, inflammation and swelling.    Gout is characterized by sudden, severe attacks of pain, redness and tenderness in joints, often the joint at the base of the big toe.  Gout is complex form of arthritis that can affect anyone.  Men are more likely to get gout but women become increasingly more susceptible to gout after menopause.  An acute attack of gout can wake you up in the middle of the night with the sensation that your big toe is on fire.  The affected joint is hot, swollen and so tender that even the weight or the sheet on it may seem intolerable.  If you experience symptoms of an acute gout attack it is important to your doctor as soon as the symptoms start.  Gout that goes untreated can lead to worsening pain and joint damage.  Risk Factors:  You are more likely to develop gout if you have high levels of uric acid in your body.    Factors that increase the uric acid level in your body include:  Lifestyle factors.  Excessive alcohol use-generally more than two drinks a day for men and more than one for women increase the risk of gout.  Medical conditions.  Certain conditions make it more likely that you will develop gout.   These include hypertension, and chronic conditions such as diabetes, high levels of fat and cholesterol in the blood, and narrowing of the arteries.  Certain medications.  The uses of Thiazide diuretics- commonly used to treat hypertension and low dose aspirin can also increase uric acid levels.  Family history of gout.  If other members of your family have had gout, you are more likely to develop the disease.  Age and sex. Gout occurs more often in men than it does in women, primarily because women tend to have lower uric acid levels than men do.  Men are more likely to develop gout earlier usually between the ages of 40-50- whereas women generally develop signs and symptoms after menopause.    Tests and diagnosis:  Tests to help diagnose gout may include:  Blood test.  Your doctor may recommend a blood test to measure the uric acid level in your blood .  Blood tests can be misleading, though.  Some people have high uric acid levels but never experience gout.  And some people have signs and symptoms of gout, but don't have unusual levels of uric acid in their blood.  Joint fluid test.  Your doctor may use a needle to draw fluid from your affected joint.  When examined under the microscope, your joint fluid may reveal urate crystals.  Treatment:  Treatment for gout usually involves medications.  What medications you and your doctor choose will be   based on your current health and other medications you currently take.  Gout medications can be used to treat acute gout attacks and prevent future attacks as well as reduce your risk of complications from gout such as the development of tophi from urate crystal deposits.  Alternative medicine:   Certain foods have been studied for their potential to lower uric acid levels, including:  Coffee.  Studies have found an association between coffee drinking (regular and decaf) and lower uric acid levels.  The evidence is not enough to encourage  non-coffee drinkers to start, but it may give clues to new ways of treating gout in the future.  Vitamin C.  Supplements containing vitamin C may reduce the levels of uric acid in your blood.  However, vitamin as a treatment for gout. Don't assume that if a little vitamin C is good, than lots is better.  Megadoses of vitamin C may increase your bodies uric acid levels.  Cherries.  Cherries have been associated with lower levels of uric acid in studies, but it isn't clear if they have any effect on gout signs and symptoms.  Eating more cherries and other dar-colored fruits, such as blackberries, blueberries, purple grapes and raspberries, may be a safe way to support your gout treatment.    Lifestyle/Diet Recommendations:  Drink 8 to 16 cups ( about 2 to 4 liters) of fluid each day, with at least half being water. Avoid alcohol Eat a moderate amount of protein, preferably from healthy sources, such as low-fat or fat-free dairy, tofu, eggs, and nut butters. Limit you daily intake of meat, fish, and poultry to 4 to 6 ounces. Avoid high fat meats and desserts. Decrease you intake of shellfish, beef, lamb, pork, eggs and cheese. Choose a good source of vitamin C daily such as citrus fruits, strawberries, broccoli,  brussel sprouts, papaya, and cantaloupe.  Choose a good source of vitamin A every other day such as yellow fruits, or dark green/yellow vegetables. Avoid drastic weigh reduction or fasting.  If weigh loss is desired lose it over a period of several months. See "dietary considerations.." chart for specific food recommendations.  Dietary Considerations for people with Gout  Food with negligible purine content (0-15 mg of purine nitrogen per 100 grams food)  May use as desired except on calorie variations  Non fat milk Cocoa Cereals (except in list II) Hard candies  Buttermilk Carbonated drinks Vegetables (except in list II) Sherbet  Coffee Fruits Sugar Honey  Tea Cottage Cheese  Gelatin-jell-o Salt  Fruit juice Breads Angel food Cake   Herbs/spices Jams/Jellies [employer]    Foods that do not contain excessive purine content, but must be limited due to fat content  Cream Eggs Oil and Salad Dressing  Half and Half Peanut Butter Chocolate  Whole Milk Cakes Potato Chips  Butter Ice Cream Fried Foods  Cheese Nuts Waffles, pancakes   List II: Food with moderate purine content (50-150 mg of purine nitrogen per 100 grams of food)  Limit total amount each day to 5 oz. cooked Lean meat, other than those on list III   Poultry, other than those on list III Fish, other than those on list III   Seafood, other than those on list III  These foods may be used occasionally  Peas Lentils Bran  Spinach Oatmeal Dried Beans and Peas  Asparagus Wheat Germ Mushrooms   Additional information about meat choices  Choose fish and poultry, particularly without skin, often.  Select lean, well  trimmed cuts of meat.  Avoid all fatty meats, bacon , sausage, fried meats, fried fish, or poultry, luncheon meats, cold cuts, hot dogs, meats canned or frozen in gravy, spareribs and frozen and packaged prepared meats.   List III: Foods with HIGH purine content / Foods to AVOID (150-800 mg of purine nitrogen per 100 grams of food)  Anchovies Herring Meat Broths  Liver Mackerel Meat Extracts  Kidney Scallops Meat Drippings  Sardines Wild Game Mincemeat  Sweetbreads Goose Gravy  Heart Tongue Yeast, baker's and brewers   Commercial soups made with any of the foods listed in List II or List III  In addition avoid all alcoholic beveragesHand Exercises Hand exercises can be helpful for almost anyone. These exercises can strengthen the hands, improve flexibility and movement, and increase blood flow to the hands. These results can make work and daily tasks easier. Hand exercises can be especially helpful for people who have joint pain from arthritis or have nerve damage from  overuse (carpal tunnel syndrome). These exercises can also help people who have injured a hand. Exercises Most of these hand exercises are gentle stretching and motion exercises. It is usually safe to do them often throughout the day. Warming up your hands before exercise may help to reduce stiffness. You can do this with gentle massage or by placing your hands in warm water for 10-15 minutes. It is normal to feel some stretching, pulling, tightness, or mild discomfort as you begin new exercises. This will gradually improve. Stop an exercise right away if you feel sudden, severe pain or your pain gets worse. Ask your health care provider which exercises are best for you. Knuckle bend or "claw" fist  Stand or sit with your arm, hand, and all five fingers pointed straight up. Make sure to keep your wrist straight during the exercise. Gently bend your fingers down toward your palm until the tips of your fingers are touching the top of your palm. Keep your big knuckle straight and just bend the small knuckles in your fingers. Hold this position for __________ seconds. Straighten (extend) your fingers back to the starting position. Repeat this exercise 5-10 times with each hand. Full finger fist  Stand or sit with your arm, hand, and all five fingers pointed straight up. Make sure to keep your wrist straight during the exercise. Gently bend your fingers into your palm until the tips of your fingers are touching the middle of your palm. Hold this position for __________ seconds. Extend your fingers back to the starting position, stretching every joint fully. Repeat this exercise 5-10 times with each hand. Straight fist Stand or sit with your arm, hand, and all five fingers pointed straight up. Make sure to keep your wrist straight during the exercise. Gently bend your fingers at the big knuckle, where your fingers meet your hand, and the middle knuckle. Keep the knuckle at the tips of your fingers  straight and try to touch the bottom of your palm. Hold this position for __________ seconds. Extend your fingers back to the starting position, stretching every joint fully. Repeat this exercise 5-10 times with each hand. Tabletop  Stand or sit with your arm, hand, and all five fingers pointed straight up. Make sure to keep your wrist straight during the exercise. Gently bend your fingers at the big knuckle, where your fingers meet your hand, as far down as you can while keeping the small knuckles in your fingers straight. Think of forming a tabletop with your fingers.  Hold this position for __________ seconds. Extend your fingers back to the starting position, stretching every joint fully. Repeat this exercise 5-10 times with each hand. Finger spread  Place your hand flat on a table with your palm facing down. Make sure your wrist stays straight as you do this exercise. Spread your fingers and thumb apart from each other as far as you can until you feel a gentle stretch. Hold this position for __________ seconds. Bring your fingers and thumb tight together again. Hold this position for __________ seconds. Repeat this exercise 5-10 times with each hand. Making circles  Stand or sit with your arm, hand, and all five fingers pointed straight up. Make sure to keep your wrist straight during the exercise. Make a circle by touching the tip of your thumb to the tip of your index finger. Hold for __________ seconds. Then open your hand wide. Repeat this motion with your thumb and each finger on your hand. Repeat this exercise 5-10 times with each hand. Thumb motion  Sit with your forearm resting on a table and your wrist straight. Your thumb should be facing up toward the ceiling. Keep your fingers relaxed as you move your thumb. Lift your thumb up as high as you can toward the ceiling. Hold for __________ seconds. Bend your thumb across your palm as far as you can, reaching the tip of your  thumb for the small finger (pinkie) side of your palm. Hold for __________ seconds. Repeat this exercise 5-10 times with each hand. Grip strengthening  Hold a stress ball or other soft ball in the middle of your hand. Slowly increase the pressure, squeezing the ball as much as you can without causing pain. Think of bringing the tips of your fingers into the middle of your palm. All of your finger joints should bend when doing this exercise. Hold your squeeze for __________ seconds, then relax. Repeat this exercise 5-10 times with each hand. Contact a health care provider if: Your hand pain or discomfort gets much worse when you do an exercise. Your hand pain or discomfort does not improve within 2 hours after you exercise. If you have any of these problems, stop doing these exercises right away. Do not do them again unless your health care provider says that you can. Get help right away if: You develop sudden, severe hand pain or swelling. If this happens, stop doing these exercises right away. Do not do them again unless your health care provider says that you can. This information is not intended to replace advice given to you by your health care provider. Make sure you discuss any questions you have with your health care provider. Document Revised: 05/08/2020 Document Reviewed: 05/15/2020 Elsevier Patient Education  2023 [employer].

## 2022-05-20 LAB — COMPLETE METABOLIC PANEL WITH GFR
AG Ratio: 1.5 (calc) (ref 1.0–2.5)
ALT: 19 U/L (ref 6–29)
AST: 18 U/L (ref 10–35)
Albumin: 4.4 g/dL (ref 3.6–5.1)
Alkaline phosphatase (APISO): 100 U/L (ref 37–153)
BUN: 23 mg/dL (ref 7–25)
CO2: 28 mmol/L (ref 20–32)
Calcium: 10.2 mg/dL (ref 8.6–10.4)
Chloride: 101 mmol/L (ref 98–110)
Creat: 1.01 mg/dL (ref 0.50–1.05)
Globulin: 3 g/dL (calc) (ref 1.9–3.7)
Glucose, Bld: 140 mg/dL — ABNORMAL HIGH (ref 65–99)
Potassium: 4.4 mmol/L (ref 3.5–5.3)
Sodium: 140 mmol/L (ref 135–146)
Total Bilirubin: 0.4 mg/dL (ref 0.2–1.2)
Total Protein: 7.4 g/dL (ref 6.1–8.1)
eGFR: 62 mL/min/{1.73_m2} (ref >=60)

## 2022-05-20 LAB — CBC WITH DIFFERENTIAL/PLATELET
Absolute Monocytes: 553 cells/uL (ref 200–950)
Basophils Absolute: 63 cells/uL (ref 0–200)
Basophils Relative: 1.1 %
Eosinophils Absolute: 182 cells/uL (ref 15–500)
Eosinophils Relative: 3.2 %
HCT: 44.3 % (ref 35.0–45.0)
Hemoglobin: 14.3 g/dL (ref 11.7–15.5)
Lymphs Abs: 2388 cells/uL (ref 850–3900)
MCH: 26.4 pg — ABNORMAL LOW (ref 27.0–33.0)
MCHC: 32.3 g/dL (ref 32.0–36.0)
MCV: 81.7 fL (ref 80.0–100.0)
MPV: 10.3 fL (ref 7.5–12.5)
Monocytes Relative: 9.7 %
Neutro Abs: 2514 cells/uL (ref 1500–7800)
Neutrophils Relative %: 44.1 %
Platelets: 364 10*3/uL (ref 140–400)
RBC: 5.42 10*6/uL — ABNORMAL HIGH (ref 3.80–5.10)
RDW: 14.2 % (ref 11.0–15.0)
Total Lymphocyte: 41.9 %
WBC: 5.7 10*3/uL (ref 3.8–10.8)

## 2022-05-20 LAB — URIC ACID: Uric Acid, Serum: 8.3 mg/dL — ABNORMAL HIGH (ref 2.5–7.0)

## 2022-05-20 NOTE — Progress Notes (Signed)
Glucose is 140. Rest of CMP WNL.  CBC stable.  Uric acid remains elevated and has trended up-8.3.  please clarify if she has been taking allopurinol 300 mg daily? Any gaps?

## 2022-05-21 ENCOUNTER — Other Ambulatory Visit: Payer: Self-pay | Admitting: *Deleted

## 2022-05-21 DIAGNOSIS — Z79899 Other long term (current) drug therapy: Secondary | ICD-10-CM

## 2022-05-21 DIAGNOSIS — M1A09X0 Idiopathic chronic gout, multiple sites, without tophus (tophi): Secondary | ICD-10-CM

## 2022-05-21 MED ORDER — ALLOPURINOL 300 MG PO TABS: 450.0000 mg | ORAL_TABLET | Freq: Every day | ORAL | 2 refills | Status: DC

## 2022-05-21 NOTE — Progress Notes (Signed)
Recommend increasing allopurinol to 450 mg daily.  Recheck uric acid and CMP in 1 month.   Avoid red meat, shellfish, and beer. Continue colchicine as prescribed.

## 2022-05-21 NOTE — Telephone Encounter (Signed)
-----   Message from Gearldine Bienenstock, PA-C sent at 05/21/2022  7:59 AM EDT ----- Recommend increasing allopurinol to 450 mg daily.  Recheck uric acid and CMP in 1 month.   Avoid red meat, shellfish, and beer. Continue colchicine as prescribed.

## 2022-06-18 ENCOUNTER — Other Ambulatory Visit: Payer: Self-pay | Admitting: Internal Medicine

## 2022-06-21 ENCOUNTER — Ambulatory Visit: Payer: BC Managed Care – PPO | Admitting: Physician Assistant

## 2022-07-01 ENCOUNTER — Other Ambulatory Visit: Payer: Self-pay | Admitting: Internal Medicine

## 2022-07-02 ENCOUNTER — Other Ambulatory Visit: Payer: Self-pay | Admitting: Physician Assistant

## 2022-07-02 DIAGNOSIS — M1A09X0 Idiopathic chronic gout, multiple sites, without tophus (tophi): Secondary | ICD-10-CM

## 2022-07-13 ENCOUNTER — Encounter: Payer: Self-pay | Admitting: Internal Medicine

## 2022-07-13 ENCOUNTER — Other Ambulatory Visit: Payer: Self-pay

## 2022-07-13 ENCOUNTER — Ambulatory Visit (INDEPENDENT_AMBULATORY_CARE_PROVIDER_SITE_OTHER): Payer: Medicare Other | Admitting: Internal Medicine

## 2022-07-13 VITALS — BP 124/82 | HR 98 | Temp 98.5°F | Ht 65.0 in | Wt 218.4 lb

## 2022-07-13 DIAGNOSIS — Z0000 Encounter for general adult medical examination without abnormal findings: Secondary | ICD-10-CM | POA: Diagnosis not present

## 2022-07-13 DIAGNOSIS — Z23 Encounter for immunization: Secondary | ICD-10-CM | POA: Diagnosis not present

## 2022-07-13 DIAGNOSIS — I129 Hypertensive chronic kidney disease with stage 1 through stage 4 chronic kidney disease, or unspecified chronic kidney disease: Secondary | ICD-10-CM

## 2022-07-13 DIAGNOSIS — M0579 Rheumatoid arthritis with rheumatoid factor of multiple sites without organ or systems involvement: Secondary | ICD-10-CM

## 2022-07-13 DIAGNOSIS — H6122 Impacted cerumen, left ear: Secondary | ICD-10-CM

## 2022-07-13 DIAGNOSIS — M791 Myalgia, unspecified site: Secondary | ICD-10-CM

## 2022-07-13 DIAGNOSIS — E1122 Type 2 diabetes mellitus with diabetic chronic kidney disease: Secondary | ICD-10-CM

## 2022-07-13 DIAGNOSIS — N182 Chronic kidney disease, stage 2 (mild): Secondary | ICD-10-CM

## 2022-07-13 DIAGNOSIS — Z6836 Body mass index (BMI) 36.0-36.9, adult: Secondary | ICD-10-CM

## 2022-07-13 MED ORDER — TRESIBA FLEXTOUCH 200 UNIT/ML ~~LOC~~ SOPN: 20.0000 [IU] | PEN_INJECTOR | Freq: Every day | SUBCUTANEOUS | 3 refills | Status: DC

## 2022-07-13 NOTE — Progress Notes (Signed)
Subjective:    Ashley Monroe is a 67 y.o. female who presents for a Welcome to Medicare exam.  She reports compliance with meds. She denies having any headaches, chest pain and shortness of breath. She has no specific concerns or complaints at this time.   Diabetes She presents for her follow-up diabetic visit. She has type 2 diabetes mellitus. Her disease course has been stable. Pertinent negatives for diabetes include no blurred vision, no chest pain, no polydipsia, no polyphagia and no polyuria. There are no hypoglycemic complications. Risk factors for coronary artery disease include diabetes mellitus, dyslipidemia, hypertension, obesity and sedentary lifestyle. She is following a generally healthy diet. She participates in exercise intermittently. Her breakfast blood glucose is taken between 7-8 am. Her breakfast blood glucose range is generally 130-140 mg/dl. An ACE inhibitor/angiotensin II receptor blocker is being taken. Eye exam is current.  Hypertension This is a chronic problem. The current episode started more than 1 year ago. The problem has been gradually improving since onset. The problem is controlled. Pertinent negatives include no blurred vision, chest pain, palpitations or shortness of breath. Risk factors for coronary artery disease include diabetes mellitus, dyslipidemia, obesity and sedentary lifestyle. Past treatments include calcium channel blockers, angiotensin blockers and diuretics. The current treatment provides moderate improvement. Compliance problems include exercise.  Hypertensive end-organ damage includes kidney disease. Identifiable causes of hypertension include chronic renal disease.        Objective:    Today's Vitals   07/13/22 1504  BP: 124/82  Pulse: 98  Temp: 98.5 F (36.9 C)  SpO2: 98%  Weight: 218 lb 6.4 oz (99.1 kg)  Height: 5\' 5"  (1.651 m)  Body mass index is 36.34 kg/m.  Medications Outpatient Encounter Medications as of 07/13/2022   Medication Sig   Accu-Chek FastClix Lancets MISC Use as instructed to check blood sugars 2 times per day dx: E11.22   ACCU-CHEK GUIDE test strip USE AS INSTRUCTED TO CHECK BLOOD SUGARS 2 TIMES PER DAY   allopurinol (ZYLOPRIM) 300 MG tablet Take 1.5 tablets (450 mg total) by mouth daily.   amLODipine (NORVASC) 10 MG tablet TAKE 1 TABLET BY MOUTH EVERY DAY   aspirin EC 81 MG tablet Take 81 mg by mouth daily.   cetirizine (ZYRTEC) 10 MG tablet Take 10 mg by mouth daily.   colchicine 0.6 MG tablet Take 1 tablet (0.6 mg total) by mouth daily.   cyclobenzaprine (FLEXERIL) 10 MG tablet Take 10 mg by mouth at bedtime as needed for muscle spasms.   DULoxetine (CYMBALTA) 30 MG capsule TAKE 1 CAPSULE BY MOUTH EVERY DAY   gabapentin (NEURONTIN) 300 MG capsule TAKE 1 CAPSULE BY MOUTH THREE TIMES A DAY   HYDROcodone-acetaminophen (NORCO) 10-325 MG tablet TAKE 1 TABLET 3 TIMES A DAY BY ORAL ROUTE AS NEEDED.   Insulin Pen Needle (PEN NEEDLES) 32G X 4 MM MISC Use as directed with Levemir   metFORMIN (GLUCOPHAGE) 500 MG tablet TAKE 1 TABLET BY MOUTH TWICE A DAY WITH FOOD   olmesartan-hydrochlorothiazide (BENICAR HCT) 40-25 MG tablet TAKE 1 TABLET BY MOUTH EVERY DAY   Semaglutide, 1 MG/DOSE, 4 MG/3ML SOPN Inject 1 mg into the skin once a week.   [DISCONTINUED] insulin degludec (TRESIBA FLEXTOUCH) 200 UNIT/ML FlexTouch Pen Inject 20 Units into the skin at bedtime.   hydroxychloroquine (PLAQUENIL) 200 MG tablet TAKE 1 TABLET BY MOUTH TWICE A DAY (Patient not taking: Reported on 07/13/2022)   No facility-administered encounter medications on file as of 07/13/2022.  History: Past Medical History:  Diagnosis Date   Allergy    Arthritis    RA   Carpal tunnel syndrome    Chronic kidney disease    Diabetes mellitus without complication (HCC)    Essential hypertension 06/01/2019   Hyperlipidemia 08/19/2016   Hypertension    PONV (postoperative nausea and vomiting)    Past Surgical History:  Procedure  Laterality Date   ABDOMINAL HYSTERECTOMY  2003   ANTERIOR CERVICAL DECOMP/DISCECTOMY FUSION N/A 07/30/2019   Procedure: Anterior Cervicql Decompression Fusion - Cervcial four-Cervical five - Cervical five-Cervical six;  Surgeon: Tia Alert, MD;  Location: [facility] OR;  Service: Neurosurgery;  Laterality: N/A;  3C   CERVICAL SPINE SURGERY  08/2017   CHOLECYSTECTOMY  1982   OVARIAN CYST REMOVAL  1981    Family History  Problem Relation Age of Onset   Atrial fibrillation Mother    Stroke Mother    Heart attack Mother    Aneurysm Mother    Atrial fibrillation Brother    Stroke Brother    Diabetes Brother    Diabetes Father    Alzheimer's disease Father    Diabetes Brother    Alzheimer's disease Maternal Grandmother    Heart attack Maternal Grandfather    Cancer Paternal Grandmother    Healthy Son    Healthy Daughter    Social History   Occupational History   Not on file  Tobacco Use   Smoking status: Never    Passive exposure: Never   Smokeless tobacco: Never  Vaping Use   Vaping Use: Never used  Substance and Sexual Activity   Alcohol use: Never    Alcohol/week: 0.0 standard drinks of alcohol   Drug use: Never   Sexual activity: Not on file    Tobacco Counseling Counseling given: Yes   Immunizations and Health Maintenance Immunization History  Administered Date(s) Administered   COVID-19, mRNA, vaccine(Comirnaty)12 years and older 12/28/2021   DTaP 05/02/2012   Influenza,inj,Quad PF,6+ Mos 11/10/2018, 10/22/2019, 11/07/2020   Influenza-Unspecified 11/30/2017, 11/10/2018, 12/28/2021   PFIZER(Purple Top)SARS-COV-2 Vaccination 04/14/2019, 05/05/2019, 11/24/2019   PNEUMOCOCCAL CONJUGATE-20 01/18/2022   Pfizer Covid-19 Vaccine Bivalent Booster 78yrs & up 11/27/2020, 12/28/2021   Tdap 05/01/2012, 07/13/2022   Zoster Recombinat (Shingrix) 12/01/2016, 11/27/2020   Health Maintenance Due  Topic Date Due   COVID-19 Vaccine (6 - 2023-24 season) 02/22/2022    Activities  of Daily Living    07/13/2022    3:33 PM 07/13/2022    3:20 PM  In your present state of health, do you have any difficulty performing the following activities:  Hearing?  0  Vision?  1  Difficulty concentrating or making decisions?  0  Walking or climbing stairs?  1  Dressing or bathing?  0  Doing errands, shopping?  0  Preparing Food and eating ? N   Using the Toilet? N   In the past six months, have you accidently leaked urine? N   Do you have problems with loss of bowel control? N   Managing your Medications? N   Managing your Finances? N   Housekeeping or managing your Housekeeping? N    Review of Systems  Constitutional: Negative.   HENT: Negative.    Eyes: Negative.  Negative for blurred vision.  Respiratory: Negative.  Negative for shortness of breath.   Cardiovascular: Negative.  Negative for chest pain and palpitations.  Gastrointestinal: Negative.   Genitourinary: Negative.   Musculoskeletal: Negative.   Skin: Negative.   Neurological: Negative.  Endo/Heme/Allergies: Negative.  Negative for polydipsia and polyphagia.  Psychiatric/Behavioral: Negative.     Physical Exam Vitals and nursing note reviewed.  Constitutional:      Appearance: Normal appearance. She is obese.  HENT:     Head: Normocephalic and atraumatic.     Right Ear: Tympanic membrane, ear canal and external ear normal.     Left Ear: Ear canal and external ear normal. There is impacted cerumen.  Eyes:     Extraocular Movements: Extraocular movements intact.     Conjunctiva/sclera: Conjunctivae normal.     Pupils: Pupils are equal, round, and reactive to light.  Cardiovascular:     Rate and Rhythm: Normal rate and regular rhythm.     Pulses: Normal pulses.          Dorsalis pedis pulses are 2+ on the right side and 2+ on the left side.     Heart sounds: Normal heart sounds.  Pulmonary:     Effort: Pulmonary effort is normal.     Breath sounds: Normal breath sounds.  Chest:  Breasts:    Tanner  Score is 5.     Right: Normal.     Left: Normal.  Abdominal:     General: Bowel sounds are normal.     Palpations: Abdomen is soft.  Genitourinary:    Comments: Deferred  Musculoskeletal:        General: Normal range of motion.     Cervical back: Normal range of motion.  Feet:     Right foot:     Protective Sensation: 5 sites tested.  5 sites sensed.     Skin integrity: Dry skin present.     Toenail Condition: Right toenails are normal.     Left foot:     Protective Sensation: 5 sites tested.  5 sites sensed.     Skin integrity: Dry skin present.     Toenail Condition: Left toenails are normal.  Skin:    General: Skin is warm and dry.  Neurological:     General: No focal deficit present.     Mental Status: She is alert and oriented to person, place, and time.  Psychiatric:        Mood and Affect: Mood normal.        Behavior: Behavior normal.     Advanced Directives: Does Patient Have a Medical Advance Directive?: No Would patient like information on creating a medical advance directive?: Yes (MAU/Ambulatory/Procedural Areas - Information given)    Assessment:    This is a routine wellness examination for this patient .   Vision/Hearing screen Hearing Screening   500Hz  1000Hz  2000Hz  4000Hz   Right ear Pass Pass Pass Pass  Left ear Pass Pass Pass Pass   Vision Screening   Right eye Left eye Both eyes  Without correction 20/30 20/20 20/20   With correction       Dietary issues and exercise activities discussed:  Current Exercise Habits: The patient does not participate in regular exercise at present, Exercise limited by: None identified   Goals      Exercise 150 min/wk Moderate Activity     Weight (lb) < 200 lb (90.7 kg)      Depression Screen    07/13/2022    3:14 PM 04/27/2022    2:05 PM 10/05/2021    2:48 PM 06/24/2020   10:21 AM  PHQ 2/9 Scores  PHQ - 2 Score 0 0 0 0  PHQ- 9 Score 0  Fall Risk    07/13/2022    3:14 PM  Fall Risk   Falls in  the past year? 0  Number falls in past yr: 0  Injury with Fall? 0  Risk for fall due to : No Fall Risks  Follow up Falls evaluation completed    Cognitive Function:        07/13/2022    3:20 PM  6CIT Screen  What Year? 0 points  What month? 0 points  What time? 0 points  Count back from 20 0 points  Months in reverse 0 points  Repeat phrase 4 points  Total Score 4 points    Patient Care Team: Dorothyann Peng, MD as PCP - General (Internal Medicine) Chilton Si, MD as PCP - Cardiology (Cardiology) Nelson Chimes, MD as Attending Physician (Ophthalmology) Maxie Better, MD as Consulting Physician (Obstetrics and Gynecology)     Plan:  1. Encounter for Medicare annual wellness exam The annual wellness visit was performed including discussion of advanced directives, assessment of functional status and cognitive function. EKG performed, NSR w/o acute changes. A full exam was also performed. She will rto in one year for AWV with [facility] Advisor.  PATIENT IS ADVISED TO GET 30-45 MINUTES REGULAR EXERCISE NO LESS THAN FOUR TO FIVE DAYS PER WEEK - BOTH WEIGHTBEARING EXERCISES AND AEROBIC ARE RECOMMENDED.  PATIENT IS ADVISED TO FOLLOW A HEALTHY DIET WITH AT LEAST SIX FRUITS/VEGGIES PER DAY, DECREASE INTAKE OF RED MEAT, AND TO INCREASE FISH INTAKE TO TWO DAYS PER WEEK.  MEATS/FISH SHOULD NOT BE FRIED, BAKED OR BROILED IS PREFERABLE.  IT IS ALSO IMPORTANT TO CUT BACK ON YOUR SUGAR INTAKE. PLEASE AVOID ANYTHING WITH ADDED SUGAR, CORN SYRUP OR OTHER SWEETENERS. IF YOU MUST USE A SWEETENER, YOU CAN TRY STEVIA. IT IS ALSO IMPORTANT TO AVOID ARTIFICIALLY SWEETENERS AND DIET BEVERAGES. LASTLY, I SUGGEST WEARING SPF 50 SUNSCREEN ON EXPOSED PARTS AND ESPECIALLY WHEN IN THE DIRECT SUNLIGHT FOR AN EXTENDED PERIOD OF TIME.  PLEASE AVOID FAST FOOD RESTAURANTS AND INCREASE YOUR WATER INTAKE. - EKG 12-Lead  2. Type 2 diabetes mellitus with stage 2 chronic kidney disease, without long-term current use of  insulin (HCC) Comments: Chronic, diabetic foot exam was performed. No med changes. She will c/w Tresiba, 20 units nightly, Ozempic weekly and metformin daily.  She will f/u in four months. I will refer her to [facility] pharmacy for med assistance, adherence evaluation.  I DISCUSSED WITH THE PATIENT AT LENGTH REGARDING THE GOALS OF GLYCEMIC CONTROL AND POSSIBLE LONG-TERM COMPLICATIONS.  I  ALSO STRESSED THE IMPORTANCE OF COMPLIANCE WITH HOME GLUCOSE MONITORING, DIETARY RESTRICTIONS INCLUDING AVOIDANCE OF SUGARY DRINKS/PROCESSED FOODS,  ALONG WITH REGULAR EXERCISE.  I  ALSO STRESSED THE IMPORTANCE OF ANNUAL EYE EXAMS, SELF FOOT CARE AND COMPLIANCE WITH OFFICE VISITS. - AMB Referral to Chronic Care Management Services - Urinalysis, Complete (81001) - Microalbumin / Creatinine Urine Ratio  3. Hypertensive nephropathy Chronic, fair control. EKG performed, NSR w/o acute changes. She will c/w amlodipine 10mg  and olmesartan/hct 40/25mg  daily. Advised to follow low sodium diet. She will f/u in 4 months.  - EKG 12-Lead - AMB Referral to Chronic Care Management Services  4. Rheumatoid arthritis involving multiple sites with positive rheumatoid factor (HCC) Comments: Chronic, followed by Rheumatology. She was previously  on Plaquenil for DMARD therapy. States she is not taking at this time. Encouraged to f/u with Rheumatology.  She is encouraged to follow anti-inflammatory diet. - AMB Referral to Chronic Care Management Services  5. Impacted cerumen  of left ear Comments: After obtaining verbal consent, left ear was flushed via irrigation without complication. No TM abnormalities were noted.  6. Class 2 severe obesity due to excess calories with serious comorbidity and body mass index (BMI) of 36.0 to 36.9 in adult [facility]) Comments: She is encouraged to aim for at least 150 minutes of exercise/week, while striving for BMI<30 to decrease cardiac risk. - AMB Referral to Chronic Care Management Services  7. Need for  diphtheria-tetanus-pertussis (Tdap) vaccine Comments: She was given Tdap, billed via [employer]. - Tdap vaccine greater than or equal to 7yo IM    I have personally reviewed and noted the following in the patient's chart:   Medical and social history Use of alcohol, tobacco or illicit drugs  Current medications and supplements Functional ability and status Nutritional status Physical activity Advanced directives List of other physicians Hospitalizations, surgeries, and ER visits in previous 12 months Vitals Screenings to include cognitive, depression, and falls Referrals and appointments  In addition, I have reviewed and discussed with patient certain preventive protocols, quality metrics, and best practice recommendations. A written personalized care plan for preventive services as well as general preventive health recommendations were provided to patient.     [name], MD 07/18/2022

## 2022-07-13 NOTE — Patient Instructions (Addendum)
Ashley Monroe , Thank you for taking time to come for your Medicare Wellness Visit. I appreciate your ongoing commitment to your health goals. Please review the following plan we discussed and let me know if I can assist you in the future.   These are the goals we discussed:  Goals      Exercise 150 min/wk Moderate Activity     Weight (lb) < 200 lb (90.7 kg)        This is a list of the screening recommended for you and due dates:  Health Maintenance  Topic Date Due   COVID-19 Vaccine (6 - 2023-24 season) 02/22/2022   Flu Shot  09/09/2022   Eye exam for diabetics  10/27/2022   Hemoglobin A1C  10/28/2022   Mammogram  12/10/2022   Colon Cancer Screening  05/10/2023   Yearly kidney function blood test for diabetes  05/19/2023   Yearly kidney health urinalysis for diabetes  07/13/2023   Complete foot exam   07/13/2023   Medicare Annual Wellness Visit  07/13/2023   DTaP/Tdap/Td vaccine (4 - Td or Tdap) 07/12/2032   Pneumonia Vaccine  Completed   DEXA scan (bone density measurement)  Completed   Hepatitis C Screening  Completed   HIV Screening  Completed   Zoster (Shingles) Vaccine  Completed   HPV Vaccine  Aged Out   Pap Smear  Discontinued     Health Maintenance, Female Adopting a healthy lifestyle and getting preventive care are important in promoting health and wellness. Ask your health care provider about: The right schedule for you to have regular tests and exams. Things you can do on your own to prevent diseases and keep yourself healthy. What should I know about diet, weight, and exercise? Eat a healthy diet  Eat a diet that includes plenty of vegetables, fruits, low-fat dairy products, and lean protein. Do not eat a lot of foods that are high in solid fats, added sugars, or sodium. Maintain a healthy weight Body mass index (BMI) is used to identify weight problems. It estimates body fat based on height and weight. Your health care provider can help determine your BMI and  help you achieve or maintain a healthy weight. Get regular exercise Get regular exercise. This is one of the most important things you can do for your health. Most adults should: Exercise for at least 150 minutes each week. The exercise should increase your heart rate and make you sweat (moderate-intensity exercise). Do strengthening exercises at least twice a week. This is in addition to the moderate-intensity exercise. Spend less time sitting. Even light physical activity can be beneficial. Watch cholesterol and blood lipids Have your blood tested for lipids and cholesterol at 66 years of age, then have this test every 5 years. Have your cholesterol levels checked more often if: Your lipid or cholesterol levels are high. You are older than 66 years of age. You are at high risk for heart disease. What should I know about cancer screening? Depending on your health history and family history, you may need to have cancer screening at various ages. This may include screening for: Breast cancer. Cervical cancer. Colorectal cancer. Skin cancer. Lung cancer. What should I know about heart disease, diabetes, and high blood pressure? Blood pressure and heart disease High blood pressure causes heart disease and increases the risk of stroke. This is more likely to develop in people who have high blood pressure readings or are overweight. Have your blood pressure checked: Every 3-5 years if  you are 77-22 years of age. Every year if you are 35 years old or older. Diabetes Have regular diabetes screenings. This checks your fasting blood sugar level. Have the screening done: Once every three years after age 60 if you are at a normal weight and have a low risk for diabetes. More often and at a younger age if you are overweight or have a high risk for diabetes. What should I know about preventing infection? Hepatitis B If you have a higher risk for hepatitis B, you should be screened for this virus.  Talk with your health care provider to find out if you are at risk for hepatitis B infection. Hepatitis C Testing is recommended for: Everyone born from [birth year] through 1964. Anyone with known risk factors for hepatitis C. Sexually transmitted infections (STIs) Get screened for STIs, including gonorrhea and chlamydia, if: You are sexually active and are younger than 66 years of age. You are older than 66 years of age and your health care provider tells you that you are at risk for this type of infection. Your sexual activity has changed since you were last screened, and you are at increased risk for chlamydia or gonorrhea. Ask your health care provider if you are at risk. Ask your health care provider about whether you are at high risk for HIV. Your health care provider may recommend a prescription medicine to help prevent HIV infection. If you choose to take medicine to prevent HIV, you should first get tested for HIV. You should then be tested every 3 months for as long as you are taking the medicine. Pregnancy If you are about to stop having your period (premenopausal) and you may become pregnant, seek counseling before you get pregnant. Take 400 to 800 micrograms (mcg) of folic acid every day if you become pregnant. Ask for birth control (contraception) if you want to prevent pregnancy. Osteoporosis and menopause Osteoporosis is a disease in which the bones lose minerals and strength with aging. This can result in bone fractures. If you are 60 years old or older, or if you are at risk for osteoporosis and fractures, ask your health care provider if you should: Be screened for bone loss. Take a calcium or vitamin D supplement to lower your risk of fractures. Be given hormone replacement therapy (HRT) to treat symptoms of menopause. Follow these instructions at home: Alcohol use Do not drink alcohol if: Your health care provider tells you not to drink. You are pregnant, may be pregnant, or are  planning to become pregnant. If you drink alcohol: Limit how much you have to: 0-1 drink a day. Know how much alcohol is in your drink. In the [place] one drink equals one 12 oz bottle of beer (355 mL), one 5 oz glass of wine (148 mL), or one 1 oz glass of hard liquor (44 mL). Lifestyle Do not use any products that contain nicotine or tobacco. These products include cigarettes, chewing tobacco, and vaping devices, such as e-cigarettes. If you need help quitting, ask your health care provider. Do not use street drugs. Do not share needles. Ask your health care provider for help if you need support or information about quitting drugs. General instructions Schedule regular health, dental, and eye exams. Stay current with your vaccines. Tell your health care provider if: You often feel depressed. You have ever been abused or do not feel safe at home. Summary Adopting a healthy lifestyle and getting preventive care are important in promoting health and wellness.  Follow your health care provider's instructions about healthy diet, exercising, and getting tested or screened for diseases. Follow your health care provider's instructions on monitoring your cholesterol and blood pressure. This information is not intended to replace advice given to you by your health care provider. Make sure you discuss any questions you have with your health care provider. Document Revised: 06/16/2020 Document Reviewed: 06/16/2020 Elsevier Patient Education  2024 [employer].

## 2022-07-14 LAB — URINALYSIS, COMPLETE
Bilirubin, UA: NEGATIVE
Glucose, UA: NEGATIVE
Ketones, UA: NEGATIVE
Leukocytes,UA: NEGATIVE
Nitrite, UA: NEGATIVE
Protein,UA: NEGATIVE
RBC, UA: NEGATIVE
Specific Gravity, UA: 1.024 (ref 1.005–1.030)
Urobilinogen, Ur: 0.2 mg/dL (ref 0.2–1.0)
pH, UA: 6.5 (ref 5.0–7.5)

## 2022-07-14 LAB — MICROSCOPIC EXAMINATION
Bacteria, UA: NONE SEEN
Casts: NONE SEEN /lpf
RBC, Urine: NONE SEEN /hpf (ref 0–2)
WBC, UA: NONE SEEN /hpf (ref 0–5)

## 2022-07-14 LAB — MICROALBUMIN / CREATININE URINE RATIO
Creatinine, Urine: 156.5 mg/dL
Microalb/Creat Ratio: 2 mg/g creat (ref 0–29)
Microalbumin, Urine: 3.9 ug/mL

## 2022-07-20 ENCOUNTER — Telehealth: Payer: Self-pay

## 2022-07-20 NOTE — Progress Notes (Signed)
  Chronic Care Management   Note  07/20/2022 Name: Ashley Monroe MRN: [id number] DOB: 09/13/1955  Ashley Monroe is a 66 y.o. year old female who is a primary care patient of Dorothyann Peng, MD. I reached out to Danley Danker by phone today in response to a referral sent by Ms. Ashley Monroe's PCP.  Ashley Monroe was given information about Chronic Care Management services today including:  CCM service includes personalized support from designated clinical staff supervised by the physician, including individualized plan of care and coordination with other care providers 24/7 contact phone numbers for assistance for urgent and routine care needs. Service will only be billed when office clinical staff spend 20 minutes or more in a month to coordinate care. Only one practitioner may furnish and bill the service in a calendar month. The patient may stop CCM services at amy time (effective at the end of the month) by phone call to the office staff. The patient will be responsible for cost sharing (co-pay) or up to 20% of the service fee (after annual deductible is met)  Ashley Monroe  agreedto scheduling an appointment with the CCM RN Case Manager   Follow up plan: Patient agreed to scheduled appointment with RN Case Manager on 07/30/2022 and Pharm d 08/17/2022(date/time).   Penne Lash, RMA Care Guide [facility]  [place], [place] [address] Direct Dial: [phone] [email or web address]

## 2022-07-30 ENCOUNTER — Ambulatory Visit: Payer: BC Managed Care – PPO | Admitting: *Deleted

## 2022-07-30 DIAGNOSIS — I10 Essential (primary) hypertension: Secondary | ICD-10-CM

## 2022-07-30 DIAGNOSIS — E1122 Type 2 diabetes mellitus with diabetic chronic kidney disease: Secondary | ICD-10-CM

## 2022-07-30 NOTE — Plan of Care (Signed)
Chronic Care Management Provider Comprehensive Care Plan    07/30/2022 Name: Ashley Monroe MRN: [id number] DOB: 10/23/1955  Referral to Chronic Care Management (CCM) services was placed by Provider:  Dorothyann Peng MD on Date: 07/18/22.  Chronic Condition 1: HYPERTENSION Provider Assessment and Plan Hypertensive nephropathy Chronic, fair control. EKG performed, NSR w/o acute changes. She will c/w amlodipine 10mg  and olmesartan/hct 40/25mg  daily. Advised to follow low sodium diet. She will f/u in 4 months.  - EKG 12-Lead - AMB Referral to Chronic Care Management Services   Expected Outcome/Goals Addressed This Visit (Provider CCM goals/Provider Assessment and plan  CCM (HYPERTENSION) EXPECTED OUTCOME: SELF-MANAGE AND REDUCE SYMPTOMS OF HYPERTENSION  Symptom Management Condition 1: Take medications as prescribed   Attend all scheduled provider appointments Call pharmacy for medication refills 3-7 days in advance of running out of medications Attend church or other social activities Perform all self care activities independently  Perform IADL's (shopping, preparing meals, housekeeping, managing finances) independently Call provider office for new concerns or questions  check blood pressure weekly choose a place to take my blood pressure (home, clinic or office, retail store) write blood pressure results in a log or diary Education in my chart- low sodium diet  Chronic Condition 2: DIABETES Provider Assessment and Plan. Type 2 diabetes mellitus with stage 2 chronic kidney disease, without long-term current use of insulin (HCC) Comments: Chronic, diabetic foot exam was performed. No med changes. She will c/w Tresiba, 20 units nightly, Ozempic weekly and metformin daily.  She will f/u in four months. I will refer her to [facility] pharmacy for med assistance, adherence evaluation.  I DISCUSSED WITH THE PATIENT AT LENGTH REGARDING THE GOALS OF GLYCEMIC CONTROL AND POSSIBLE LONG-TERM  COMPLICATIONS.  I  ALSO STRESSED THE IMPORTANCE OF COMPLIANCE WITH HOME GLUCOSE MONITORING, DIETARY RESTRICTIONS INCLUDING AVOIDANCE OF SUGARY DRINKS/PROCESSED FOODS,  ALONG WITH REGULAR EXERCISE.  I  ALSO STRESSED THE IMPORTANCE OF ANNUAL EYE EXAMS, SELF FOOT CARE AND COMPLIANCE WITH OFFICE VISITS. - AMB Referral to Chronic Care Management Services - Urinalysis, Complete (81001) - Microalbumin / Creatinine Urine Ratio    Expected Outcome/Goals Addressed This Visit (Provider CCM goals/Provider Assessment and plan  CCM (DIABETES) EXPECTED OUTCOME: MONITOR, SELF-MANAGE AND REDUCE SYMPTOMS OF DIABETES   Symptom Management Condition 2: Take medications as prescribed   Attend all scheduled provider appointments Call pharmacy for medication refills 3-7 days in advance of running out of medications Attend church or other social activities Perform all self care activities independently  Perform IADL's (shopping, preparing meals, housekeeping, managing finances) independently Call provider office for new concerns or questions  check blood sugar at prescribed times: once daily check feet daily for cuts, sores or redness enter blood sugar readings and medication or insulin into daily log take the blood sugar log to all doctor visits take the blood sugar meter to all doctor visits set goal weight trim toenails straight across fill half of plate with vegetables limit fast food meals to no more than 1 per week set a realistic goal Perform IADL's (shopping, preparing meals, housekeeping, managing finances) independently Call provider office for new concerns or questions  check blood sugar at prescribed times: once daily check feet daily for cuts, sores or redness enter blood sugar readings and medication or insulin into daily log take the blood sugar log to all doctor visits take the blood sugar meter to all doctor visits set goal weight trim toenails straight across fill half of plate with  vegetables limit fast  food meals to no more than 1 per week set a realistic goal Education in my chart-  Try to exercise at least 20 minutes per day- walking is a good start  Problem List Patient Active Problem List   Diagnosis Date Noted   Recurrent pain of right knee 01/21/2021   Estrogen deficiency 08/16/2020   Syncope 08/16/2020   Personal history of COVID-19 08/16/2020   S/P cervical spinal fusion 07/30/2019   Myelomalacia of cervical cord (HCC) 06/14/2019   Essential hypertension 06/01/2019   Rheumatoid arthritis involving multiple sites with positive rheumatoid factor (HCC) 04/25/2019   Class 2 severe obesity due to excess calories with serious comorbidity and body mass index (BMI) of 36.0 to 36.9 in adult [facility]) 04/25/2019   DDD (degenerative disc disease), cervical 03/15/2019   Carpal tunnel syndrome of right wrist 12/08/2018   Cervical post-laminectomy syndrome 08/28/2018   Stenosis of cervical spine with myelopathy (HCC) 08/23/2017   Hyperlipidemia 08/19/2016   Chest pain 08/12/2016   Chronic kidney disease, stage 2 (mild) 08/12/2016   Hypertensive nephropathy 08/12/2016   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 08/12/2016   Other long term (current) drug therapy 08/12/2016   Type 2 diabetes mellitus with diabetic chronic kidney disease (HCC) 08/12/2016    Medication Management  Current Outpatient Medications:    Accu-Chek FastClix Lancets MISC, Use as instructed to check blood sugars 2 times per day dx: E11.22, Disp: 300 each, Rfl: 2   ACCU-CHEK GUIDE test strip, USE AS INSTRUCTED TO CHECK BLOOD SUGARS 2 TIMES PER DAY, Disp: 200 strip, Rfl: 4   allopurinol (ZYLOPRIM) 300 MG tablet, Take 1.5 tablets (450 mg total) by mouth daily., Disp: 45 tablet, Rfl: 2   amLODipine (NORVASC) 10 MG tablet, TAKE 1 TABLET BY MOUTH EVERY DAY, Disp: 90 tablet, Rfl: 2   aspirin EC 81 MG tablet, Take 81 mg by mouth daily., Disp: , Rfl:    cetirizine (ZYRTEC) 10 MG tablet,  Take 10 mg by mouth daily., Disp: , Rfl:    colchicine 0.6 MG tablet, Take 1 tablet (0.6 mg total) by mouth daily., Disp: 30 tablet, Rfl: 5   cyclobenzaprine (FLEXERIL) 10 MG tablet, Take 10 mg by mouth at bedtime as needed for muscle spasms., Disp: , Rfl: 0   DULoxetine (CYMBALTA) 30 MG capsule, TAKE 1 CAPSULE BY MOUTH EVERY DAY, Disp: 90 capsule, Rfl: 1   gabapentin (NEURONTIN) 300 MG capsule, TAKE 1 CAPSULE BY MOUTH THREE TIMES A DAY, Disp: , Rfl:    HYDROcodone-acetaminophen (NORCO) 10-325 MG tablet, TAKE 1 TABLET 3 TIMES A DAY BY ORAL ROUTE AS NEEDED., Disp: , Rfl:    hydroxychloroquine (PLAQUENIL) 200 MG tablet, TAKE 1 TABLET BY MOUTH TWICE A DAY, Disp: 180 tablet, Rfl: 0   insulin degludec (TRESIBA FLEXTOUCH) 200 UNIT/ML FlexTouch Pen, Inject 20 Units into the skin at bedtime., Disp: 15 mL, Rfl: 3   Insulin Pen Needle (PEN NEEDLES) 32G X 4 MM MISC, Use as directed with Levemir, Disp: 150 each, Rfl: 2   metFORMIN (GLUCOPHAGE) 500 MG tablet, TAKE 1 TABLET BY MOUTH TWICE A DAY WITH FOOD, Disp: 180 tablet, Rfl: 2   olmesartan-hydrochlorothiazide (BENICAR HCT) 40-25 MG tablet, TAKE 1 TABLET BY MOUTH EVERY DAY, Disp: 90 tablet, Rfl: 1   Semaglutide, 1 MG/DOSE, 4 MG/3ML SOPN, Inject 1 mg into the skin once a week., Disp: 9 mL, Rfl: 3  Cognitive Assessment Identity Confirmed: : Name; DOB Cognitive Status: Normal   Functional Assessment Hearing Difficulty or Deaf: no Wear  Glasses or Blind: yes Vision Management: reading glasses Difficulty Communicating: no Difficulty Eating/Swallowing: no Walking or Climbing Stairs Difficulty: no Dressing/Bathing Difficulty: no Doing Errands Independently Difficulty (such as shopping) (CP): no   Caregiver Assessment  Primary Source of Support/Comfort: child(ren); spouse Name of Support/Comfort Primary Source: lives with spouse Shadaya Marschner,  can call on 2 adult children if needed People in Home: spouse   Planned Interventions  Provided education  to patient about basic DM disease process; Reviewed medications with patient and discussed importance of medication adherence;        Reviewed prescribed diet with patient carbohydrate modified, plate method; Counseled on importance of regular laboratory monitoring as prescribed;        Discussed plans with patient for ongoing care management follow up and provided patient with direct contact information for care management team;      Provided patient with written educational materials related to hypo and hyperglycemia and importance of correct treatment;       Advised patient, providing education and rationale, to check cbg once daily and record        call provider for findings outside established parameters;       Review of patient status, including review of consultants reports, relevant laboratory and other test results, and medications completed;       Screening for signs and symptoms of depression related to chronic disease state;        Assessed social determinant of health barriers;        Reviewed instructions by primary care provider to exercise 150 minutes per week Evaluation of current treatment plan related to hypertension self management and patient's adherence to plan as established by provider;   Reviewed prescribed diet low sodium Reviewed medications with patient and discussed importance of compliance;  Counseled on the importance of exercise goals with target of 150 minutes per week Discussed plans with patient for ongoing care management follow up and provided patient with direct contact information for care management team; Advised patient, providing education and rationale, to monitor blood pressure daily and record, calling PCP for findings outside established parameters;  Provided education on prescribed diet low sodium;  Discussed complications of poorly controlled blood pressure such as heart disease, stroke, circulatory complications, vision complications, kidney  impairment, sexual dysfunction;  Screening for signs and symptoms of depression related to chronic disease state;  Assessed social determinant of health barriers;  Reviewed all upcoming scheduled appointments  Interaction and coordination with outside resources, practitioners, and providers See CCM Referral  Care Plan: Available in MyChart

## 2022-07-30 NOTE — Patient Instructions (Signed)
Please call the care guide team at [phone] if you need to cancel or reschedule your appointment.   If you are experiencing a Mental Health or Behavioral Health Crisis or need someone to talk to, please call the Suicide and Crisis Lifeline: 988 call the [place] National Suicide Prevention Lifeline: 650-498-8642 or TTY: 818 406 5822 TTY [phone]) to talk to a trained counselor call 1-800-273-TALK (toll free, 24 hour hotline) go to [facility] [address] [place] [phone]) call 911  Low-Sodium Eating Plan Salt (sodium) helps you keep a healthy balance of fluids in your body. Too much sodium can raise your blood pressure. It can also cause fluid and waste to be held in your body. Your health care provider or dietitian may recommend a low-sodium eating plan if you have high blood pressure (hypertension), kidney disease, liver disease, or heart failure. Eating less sodium can help lower your blood pressure and reduce swelling. It can also protect your heart, liver, and kidneys. What are tips for following this plan? Reading food labels  Check food labels for the amount of sodium per serving. If you eat more than one serving, you must multiply the listed amount by the number of servings. Choose foods with less than 140 milligrams (mg) of sodium per serving. Avoid foods with 300 mg of sodium or more per serving. Always check how much sodium is in a product, even if the label says "unsalted" or "no salt added." Shopping  Buy products labeled as "low-sodium" or "no salt added." Buy fresh foods. Avoid canned foods and pre-made or frozen meals. Avoid canned, cured, or processed meats. Buy breads that have less than 80 mg of sodium per slice. Cooking  Eat more home-cooked food. Try to eat less restaurant, buffet, and fast food. Try not to add salt when you cook. Use salt-free seasonings or herbs instead of table salt or sea salt. Check with your  provider or pharmacist before using salt substitutes. Cook with plant-based oils, such as canola, sunflower, or olive oil. Meal planning When eating at a restaurant, ask if your food can be made with less salt or no salt. Avoid dishes labeled as brined, pickled, cured, or smoked. Avoid dishes made with soy sauce, miso, or teriyaki sauce. Avoid foods that have monosodium glutamate (MSG) in them. MSG may be added to some restaurant food, sauces, soups, bouillon, and canned foods. Make meals that can be grilled, baked, poached, roasted, or steamed. These are often made with less sodium. General information Try to limit your sodium intake to 1,500-2,300 mg each day, or the amount told by your provider. What foods should I eat? Fruits Fresh, frozen, or canned fruit. Fruit juice. Vegetables Fresh or frozen vegetables. "No salt added" canned vegetables. "No salt added" tomato sauce and paste. Low-sodium or reduced-sodium tomato and vegetable juice. Grains Low-sodium cereals, such as oats, puffed wheat and rice, and shredded wheat. Low-sodium crackers. Unsalted rice. Unsalted pasta. Low-sodium bread. Whole grain breads and whole grain pasta. Meats and other proteins Fresh or frozen meat, poultry, seafood, and fish. These should have no added salt. Low-sodium canned tuna and salmon. Unsalted nuts. Dried peas, beans, and lentils without added salt. Unsalted canned beans. Eggs. Unsalted nut butters. Dairy Milk. Soy milk. Cheese that is naturally low in sodium, such as ricotta cheese, fresh mozzarella, or Swiss cheese. Low-sodium or reduced-sodium cheese. Cream cheese. Yogurt. Seasonings and condiments Fresh and dried herbs and spices. Salt-free seasonings. Low-sodium mustard and ketchup. Sodium-free salad dressing. Sodium-free light  mayonnaise. Fresh or refrigerated horseradish. Lemon juice. Vinegar. Other foods Homemade, reduced-sodium, or low-sodium soups. Unsalted popcorn and pretzels. Low-salt or  salt-free chips. The items listed above may not be all the foods and drinks you can have. Talk to a dietitian to learn more. What foods should I avoid? Vegetables Sauerkraut, pickled vegetables, and relishes. Olives. [place] fries. Onion rings. Regular canned vegetables, except low-sodium or reduced-sodium items. Regular canned tomato sauce and paste. Regular tomato and vegetable juice. Frozen vegetables in sauces. Grains Instant hot cereals. Bread stuffing, pancake, and biscuit mixes. Croutons. Seasoned rice or pasta mixes. Noodle soup cups. Boxed or frozen macaroni and cheese. Regular salted crackers. Self-rising flour. Meats and other proteins Meat or fish that is salted, canned, smoked, spiced, or pickled. Precooked or cured meat, such as sausages or meat loaves. Tomasa Blase. Ham. Pepperoni. Hot dogs. Corned beef. Chipped beef. Salt pork. Jerky. Pickled herring, anchovies, and sardines. Regular canned tuna. Salted nuts. Dairy Processed cheese and cheese spreads. Hard cheeses. Cheese curds. Blue cheese. Feta cheese. String cheese. Regular cottage cheese. Buttermilk. Canned milk. Fats and oils Salted butter. Regular margarine. Ghee. Bacon fat. Seasonings and condiments Onion salt, garlic salt, seasoned salt, table salt, and sea salt. Canned and packaged gravies. Worcestershire sauce. Tartar sauce. Barbecue sauce. Teriyaki sauce. Soy sauce, including reduced-sodium soy sauce. Steak sauce. Fish sauce. Oyster sauce. Cocktail sauce. Horseradish that you find on the shelf. Regular ketchup and mustard. Meat flavorings and tenderizers. Bouillon cubes. Hot sauce. Pre-made or packaged marinades. Pre-made or packaged taco seasonings. Relishes. Regular salad dressings. Salsa. Other foods Salted popcorn and pretzels. Corn chips and puffs. Potato and tortilla chips. Canned or dried soups. Pizza. Frozen entrees and pot pies. The items listed above may not be all the foods and drinks you should avoid. Talk to a  dietitian to learn more. This information is not intended to replace advice given to you by your health care provider. Make sure you discuss any questions you have with your health care provider. Document Revised: 02/11/2022 Document Reviewed: 02/11/2022 Elsevier Patient Education  2024 [employer] Hypoglycemia Hypoglycemia is when the sugar (glucose) level in your blood is too low. Low blood sugar can happen to people who have diabetes and people who do not have diabetes. Low blood sugar can happen quickly, and it can be an emergency. What are the causes? This condition happens most often in people who have diabetes. It may be caused by: Diabetes medicine. Not eating enough, or not eating often enough. Doing more physical activity. Drinking alcohol on an empty stomach. If you do not have diabetes, this condition may be caused by: A tumor in the pancreas. Not eating enough, or not eating for long periods at a time (fasting). A very bad infection or illness. Problems after having weight loss (bariatric) surgery. Kidney failure or liver failure. Certain medicines. What increases the risk? This condition is more likely to develop in people who: Have diabetes and take medicines to lower their blood sugar. Abuse alcohol. Have a very bad illness. What are the signs or symptoms? Mild Hunger. Sweating and feeling clammy. Feeling dizzy or light-headed. Being sleepy or having trouble sleeping. Feeling like you may vomit (nauseous). A fast heartbeat. A headache. Blurry vision. Mood changes, such as: Being grouchy. Feeling worried or nervous (anxious). Tingling or loss of feeling (numbness) around your mouth, lips, or tongue. Moderate Confusion and poor judgment. Behavior changes. Weakness. Uneven heartbeat. Trouble with moving (coordination). Very low Very low blood sugar (severe hypoglycemia) is  a medical emergency. It can cause: Fainting. Seizures. Loss of consciousness  (coma). Death. How is this treated? Treating low blood sugar Low blood sugar is often treated by eating or drinking something that has sugar in it right away. The food or drink should contain 15 grams of a fast-acting carb (carbohydrate). Options include: 4 oz (120 mL) of fruit juice. 4 oz (120 mL) of regular soda (not diet soda). A few pieces of hard candy. Check food labels to see how many pieces to eat for 15 grams. 1 Tbsp (15 mL) of sugar or honey. 4 glucose tablets. 1 tube of glucose gel. Treating low blood sugar if you have diabetes If you can think clearly and swallow safely, follow the 15:15 rule: Take 15 grams of a fast-acting carb. Talk with your doctor about how much you should take. Always keep a source of fast-acting carb with you, such as: Glucose tablets (take 4 tablets). A few pieces of hard candy. Check food labels to see how many pieces to eat for 15 grams. 4 oz (120 mL) of fruit juice. 4 oz (120 mL) of regular soda (not diet soda). 1 Tbsp (15 mL) of honey or sugar. 1 tube of glucose gel. Check your blood sugar 15 minutes after you take the carb. If your blood sugar is still at or below 70 mg/dL (3.9 mmol/L), take 15 grams of a carb again. If your blood sugar does not go above 70 mg/dL (3.9 mmol/L) after 3 tries, get help right away. After your blood sugar goes back to normal, eat a meal or a snack within 1 hour.  Treating very low blood sugar If your blood sugar is below 54 mg/dL (3 mmol/L), you have very low blood sugar, or severe hypoglycemia. This is an emergency. Get medical help right away. If you have very low blood sugar and you cannot eat or drink, you will need to be given a hormone called glucagon. A family member or friend should learn how to check your blood sugar and how to give you glucagon. Ask your doctor if you need to have an emergency glucagon kit at home. Very low blood sugar may also need to be treated in a hospital. Follow these instructions at  home: General instructions Take over-the-counter and prescription medicines only as told by your doctor. Stay aware of your blood sugar as told by your doctor. If you drink alcohol: Limit how much you have to: 0-1 drink a day for women who are not pregnant. 0-2 drinks a day for men. Know how much alcohol is in your drink. In the [place] one drink equals one 12 oz bottle of beer (355 mL), one 5 oz glass of wine (148 mL), or one 1 oz glass of hard liquor (44 mL). Be sure to eat food when you drink alcohol. Know that your body absorbs alcohol quickly. This may lead to low blood sugar later. Be sure to keep checking your blood sugar. Keep all follow-up visits. If you have diabetes:  Always have a fast-acting carb (15 grams) with you to treat low blood sugar. Follow your diabetes care plan as told by your doctor. Make sure you: Know the symptoms of low blood sugar. Check your blood sugar as often as told. Always check it before and after exercise. Always check your blood sugar before you drive. Take your medicines as told. Follow your meal plan. Eat on time. Do not skip meals. Share your diabetes care plan with: Your work or school. People  you live with. Carry a card or wear jewelry that says you have diabetes. Where to find more information American Diabetes Association: www.diabetes.org Contact a doctor if: You have trouble keeping your blood sugar in your target range. You have low blood sugar often. Get help right away if: You still have symptoms after you eat or drink something that contains 15 grams of fast-acting carb, and you cannot get your blood sugar above 70 mg/dL by following the 69:62 rule. Your blood sugar is below 54 mg/dL (3 mmol/L). You have a seizure. You faint. These symptoms may be an emergency. Get help right away. Call your local emergency services (911 in the [place] Do not wait to see if the symptoms will go away. Do not drive yourself to the  hospital. Summary Hypoglycemia happens when the level of sugar (glucose) in your blood is too low. Low blood sugar can happen to people who have diabetes and people who do not have diabetes. Low blood sugar can happen quickly, and it can be an emergency. Make sure you know the symptoms of low blood sugar and know how to treat it. Always keep a source of sugar (fast-acting carb) with you to treat low blood sugar. This information is not intended to replace advice given to you by your health care provider. Make sure you discuss any questions you have with your health care provider. Document Revised: 12/27/2019 Document Reviewed: 12/27/2019 Elsevier Patient Education  2024 [employer].    Following is a copy of the CCM Program Consent:  CCM service includes personalized support from designated clinical staff supervised by the physician, including individualized plan of care and coordination with other care providers 24/7 contact phone numbers for assistance for urgent and routine care needs. Service will only be billed when office clinical staff spend 20 minutes or more in a month to coordinate care. Only one practitioner may furnish and bill the service in a calendar month. The patient may stop CCM services at amy time (effective at the end of the month) by phone call to the office staff. The patient will be responsible for cost sharing (co-pay) or up to 20% of the service fee (after annual deductible is met)  Following is a copy of your full provider care plan:   Goals Addressed             This Visit's Progress    CCM (DIABETES) EXPECTED OUTCOME: MONITOR, SELF-MANAGE AND REDUCE SYMPTOMS OF DIABETES       Current Barriers:  Knowledge Deficits related to Diabetes management Chronic Disease Management support and education needs related to Diabetes, diet, exercise Patient reports she checks CBG once daily fasting with recent reading 98 and all readings under 135 Patient does not  exercise and states she will consider walking  Planned Interventions: Provided education to patient about basic DM disease process; Reviewed medications with patient and discussed importance of medication adherence;        Reviewed prescribed diet with patient carbohydrate modified, plate method; Counseled on importance of regular laboratory monitoring as prescribed;        Discussed plans with patient for ongoing care management follow up and provided patient with direct contact information for care management team;      Provided patient with written educational materials related to hypo and hyperglycemia and importance of correct treatment;       Advised patient, providing education and rationale, to check cbg once daily and record        call  provider for findings outside established parameters;       Review of patient status, including review of consultants reports, relevant laboratory and other test results, and medications completed;       Screening for signs and symptoms of depression related to chronic disease state;        Assessed social determinant of health barriers;        Reviewed instructions by primary care provider to exercise 150 minutes per week  Symptom Management: Take medications as prescribed   Attend all scheduled provider appointments Call pharmacy for medication refills 3-7 days in advance of running out of medications Attend church or other social activities Perform all self care activities independently  Perform IADL's (shopping, preparing meals, housekeeping, managing finances) independently Call provider office for new concerns or questions  check blood sugar at prescribed times: once daily check feet daily for cuts, sores or redness enter blood sugar readings and medication or insulin into daily log take the blood sugar log to all doctor visits take the blood sugar meter to all doctor visits set goal weight trim toenails straight across fill half of plate  with vegetables limit fast food meals to no more than 1 per week set a realistic goal Education in my chart-  Try to exercise at least 20 minutes per day- walking is a good start  Follow Up Plan: Telephone follow up appointment with care management team member scheduled for:  10/07/22 at 130 pm       CCM (HYPERTENSION) EXPECTED OUTCOME: SELF-MANAGE AND REDUCE SYMPTOMS OF HYPERTENSION       Current Barriers:  Knowledge Deficits related to Hypertension management Chronic Disease Management support and education needs related to Hypertension, diet, exercise No Advanced Directives in place- pt states she is in process of completing documents Patient reports she lives with spouse and helps with his care, pt states she has 2 adult children she can call on if needed, pt states she is independent in all aspects of her care, continues to drive, recently retired from work Patient reports she checks blood pressure once monthly  Planned Interventions: Evaluation of current treatment plan related to hypertension self management and patient's adherence to plan as established by provider;   Reviewed prescribed diet low sodium Reviewed medications with patient and discussed importance of compliance;  Counseled on the importance of exercise goals with target of 150 minutes per week Discussed plans with patient for ongoing care management follow up and provided patient with direct contact information for care management team; Advised patient, providing education and rationale, to monitor blood pressure daily and record, calling PCP for findings outside established parameters;  Provided education on prescribed diet low sodium;  Discussed complications of poorly controlled blood pressure such as heart disease, stroke, circulatory complications, vision complications, kidney impairment, sexual dysfunction;  Screening for signs and symptoms of depression related to chronic disease state;  Assessed social  determinant of health barriers;  Reviewed all upcoming scheduled appointments  Symptom Management: Take medications as prescribed   Attend all scheduled provider appointments Call pharmacy for medication refills 3-7 days in advance of running out of medications Attend church or other social activities Perform all self care activities independently  Perform IADL's (shopping, preparing meals, housekeeping, managing finances) independently Call provider office for new concerns or questions  check blood pressure weekly choose a place to take my blood pressure (home, clinic or office, retail store) write blood pressure results in a log or diary Education in my chart- low  sodium diet  Follow Up Plan: Telephone follow up appointment with care management team member scheduled for:  10/07/22 at 130 pm          Patient verbalizes understanding of instructions and care plan provided today and agrees to view in MyChart. Active MyChart status and patient understanding of how to access instructions and care plan via MyChart confirmed with patient.  Telephone follow up appointment with care management team member scheduled for: 10/07/22 at 130 pm

## 2022-07-30 NOTE — Chronic Care Management (AMB) (Signed)
Chronic Care Management   CCM RN Visit Note  07/30/2022 Name: Ashley Monroe MRN: [id number] DOB: 02-06-1956  Subjective: Ashley Monroe is a 67 y.o. year old female who is a primary care patient of Dorothyann Peng, MD. The patient was referred to the Chronic Care Management team for assistance with care management needs subsequent to provider initiation of CCM services and plan of care.    Today's Visit:  Engaged with patient by telephone for follow up visit.     SDOH Interventions Today    Flowsheet Row Most Recent Value  SDOH Interventions   Food Insecurity Interventions Intervention Not Indicated  Housing Interventions Intervention Not Indicated  Utilities Interventions Intervention Not Indicated  Financial Strain Interventions Intervention Not Indicated  Physical Activity Interventions Patient Declined  Stress Interventions Intervention Not Indicated  Social Connections Interventions Intervention Not Indicated         Goals Addressed             This Visit's Progress    CCM (DIABETES) EXPECTED OUTCOME: MONITOR, SELF-MANAGE AND REDUCE SYMPTOMS OF DIABETES       Current Barriers:  Knowledge Deficits related to Diabetes management Chronic Disease Management support and education needs related to Diabetes, diet, exercise Patient reports she checks CBG once daily fasting with recent reading 98 and all readings under 135 Patient does not exercise and states she will consider walking  Planned Interventions: Provided education to patient about basic DM disease process; Reviewed medications with patient and discussed importance of medication adherence;        Reviewed prescribed diet with patient carbohydrate modified, plate method; Counseled on importance of regular laboratory monitoring as prescribed;        Discussed plans with patient for ongoing care management follow up and provided patient with direct contact information for care management team;       Provided patient with written educational materials related to hypo and hyperglycemia and importance of correct treatment;       Advised patient, providing education and rationale, to check cbg once daily and record        call provider for findings outside established parameters;       Review of patient status, including review of consultants reports, relevant laboratory and other test results, and medications completed;       Screening for signs and symptoms of depression related to chronic disease state;        Assessed social determinant of health barriers;        Reviewed instructions by primary care provider to exercise 150 minutes per week  Symptom Management: Take medications as prescribed   Attend all scheduled provider appointments Call pharmacy for medication refills 3-7 days in advance of running out of medications Attend church or other social activities Perform all self care activities independently  Perform IADL's (shopping, preparing meals, housekeeping, managing finances) independently Call provider office for new concerns or questions  check blood sugar at prescribed times: once daily check feet daily for cuts, sores or redness enter blood sugar readings and medication or insulin into daily log take the blood sugar log to all doctor visits take the blood sugar meter to all doctor visits set goal weight trim toenails straight across fill half of plate with vegetables limit fast food meals to no more than 1 per week set a realistic goal Education in my chart-  Try to exercise at least 20 minutes per day- walking is a good start  Follow Up Plan: Telephone  follow up appointment with care management team member scheduled for:  10/07/22 at 130 pm       CCM (HYPERTENSION) EXPECTED OUTCOME: SELF-MANAGE AND REDUCE SYMPTOMS OF HYPERTENSION       Current Barriers:  Knowledge Deficits related to Hypertension management Chronic Disease Management support and education needs  related to Hypertension, diet, exercise No Advanced Directives in place- pt states she is in process of completing documents Patient reports she lives with spouse and helps with his care, pt states she has 2 adult children she can call on if needed, pt states she is independent in all aspects of her care, continues to drive, recently retired from work Patient reports she checks blood pressure once monthly  Planned Interventions: Evaluation of current treatment plan related to hypertension self management and patient's adherence to plan as established by provider;   Reviewed prescribed diet low sodium Reviewed medications with patient and discussed importance of compliance;  Counseled on the importance of exercise goals with target of 150 minutes per week Discussed plans with patient for ongoing care management follow up and provided patient with direct contact information for care management team; Advised patient, providing education and rationale, to monitor blood pressure daily and record, calling PCP for findings outside established parameters;  Provided education on prescribed diet low sodium;  Discussed complications of poorly controlled blood pressure such as heart disease, stroke, circulatory complications, vision complications, kidney impairment, sexual dysfunction;  Screening for signs and symptoms of depression related to chronic disease state;  Assessed social determinant of health barriers;  Reviewed all upcoming scheduled appointments  Symptom Management: Take medications as prescribed   Attend all scheduled provider appointments Call pharmacy for medication refills 3-7 days in advance of running out of medications Attend church or other social activities Perform all self care activities independently  Perform IADL's (shopping, preparing meals, housekeeping, managing finances) independently Call provider office for new concerns or questions  check blood pressure weekly choose  a place to take my blood pressure (home, clinic or office, retail store) write blood pressure results in a log or diary Education in my chart- low sodium diet  Follow Up Plan: Telephone follow up appointment with care management team member scheduled for:  10/07/22 at 130 pm          Plan:Telephone follow up appointment with care management team member scheduled for:  10/07/22 at 130 pm  Irving Shows [facility], BSN RN Case Manager [facility] [phone]

## 2022-08-05 NOTE — Progress Notes (Unsigned)
Office Visit Note  Patient: Ashley Monroe             Date of Birth: 1955/02/13           MRN: [id number]             PCP: Dorothyann Peng, MD Referring: Dorothyann Peng, MD Visit Date: 08/19/2022 Occupation: @GUAROCC @  Subjective:  Medication monitoring   History of Present Illness: Ashley Monroe is a 67 y.o. female with history of seropositive rheumatoid arthritis, osteoarthritis, and gout.  Patient remains on plaquenil 200 mg 1 tablet by mouth twice daily.  She is tolerating plaquenil without any side effects and has not missed any doses recently.  She denies any recent rheumatoid arthritis flares.  She has not noticed any joint swelling but continues to experience periodic hand cramping with activity. Patient denies any gout flares since her last office visit.  She has been taking allopurinol 450 mg daily and colchicine 0.6 mg 1 tablet daily.  She is tolerating combination therapy without any side effects.  Patient states that she has had episodic fluid retention in her lower extremities and has been following up with her PCP.  She is currently taking Benicar HCT.   Activities of Daily Living:  Patient reports morning stiffness for 15 minutes.   Patient Reports nocturnal pain.  Difficulty dressing/grooming: Denies Difficulty climbing stairs: Reports Difficulty getting out of chair: Reports Difficulty using hands for taps, buttons, cutlery, and/or writing: Reports  Review of Systems  Constitutional:  Positive for fatigue.  HENT:  Negative for mouth sores and mouth dryness.   Eyes:  Positive for dryness.  Respiratory:  Negative for shortness of breath.   Cardiovascular:  Negative for chest pain and palpitations.  Gastrointestinal:  Positive for diarrhea. Negative for blood in stool and constipation.  Endocrine: Negative for increased urination.  Genitourinary:  Negative for involuntary urination.  Musculoskeletal:  Positive for joint pain, joint pain, joint swelling,  myalgias, muscle weakness, morning stiffness, muscle tenderness and myalgias. Negative for gait problem.  Skin:  Positive for hair loss. Negative for color change, rash and sensitivity to sunlight.  Allergic/Immunologic: Negative for susceptible to infections.  Neurological:  Negative for dizziness and headaches.  Hematological:  Negative for swollen glands.  Psychiatric/Behavioral:  Positive for sleep disturbance. Negative for depressed mood. The patient is nervous/anxious.     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   Essential hypertension 06/01/2019   Rheumatoid arthritis involving multiple sites with positive rheumatoid factor (HCC) 04/25/2019   Class 2 severe obesity due to excess calories with serious comorbidity and body mass index (BMI) of 36.0 to 36.9 in adult [facility]) 04/25/2019   DDD (degenerative disc disease), cervical 03/15/2019   Carpal tunnel syndrome of right wrist 12/08/2018   Cervical post-laminectomy syndrome 08/28/2018   Stenosis of cervical spine with myelopathy (HCC) 08/23/2017   Hyperlipidemia 08/19/2016   Chest pain 08/12/2016   Chronic kidney disease, stage 2 (mild) 08/12/2016   Hypertensive nephropathy 08/12/2016   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 08/12/2016   Other long term (current) drug therapy 08/12/2016   Type 2 diabetes mellitus with diabetic chronic kidney disease (HCC) 08/12/2016    Past Medical History:  Diagnosis Date   Allergy  Arthritis    RA   Carpal tunnel syndrome    Chronic kidney disease    Diabetes mellitus without complication (HCC)    Essential hypertension 06/01/2019   Hyperlipidemia 08/19/2016   Hypertension    PONV (postoperative nausea and vomiting)      Family History  Problem Relation Age of Onset   Atrial fibrillation Mother    Stroke Mother    Heart attack Mother    Aneurysm Mother    Atrial fibrillation Brother    Stroke Brother    Diabetes Brother    Diabetes Father    Alzheimer's disease Father    Diabetes Brother    Alzheimer's disease Maternal Grandmother    Heart attack Maternal Grandfather    Cancer Paternal Grandmother    Healthy Son    Healthy Daughter    Past Surgical History:  Procedure Laterality Date   ABDOMINAL HYSTERECTOMY  2003   ANTERIOR CERVICAL DECOMP/DISCECTOMY FUSION N/A 07/30/2019   Procedure: Anterior Cervicql Decompression Fusion - Cervcial four-Cervical five - Cervical five-Cervical six;  Surgeon: Tia Alert, MD;  Location: [facility] OR;  Service: Neurosurgery;  Laterality: N/A;  3C   CERVICAL SPINE SURGERY  08/2017   CHOLECYSTECTOMY  1982   OVARIAN CYST REMOVAL  1981   Social History   Social History Narrative   Not on file   Immunization History  Administered Date(s) Administered   COVID-19, mRNA, vaccine(Comirnaty)12 years and older 12/28/2021   DTaP 05/02/2012   Influenza,inj,Quad PF,6+ Mos 11/10/2018, 10/22/2019, 11/07/2020   Influenza-Unspecified 11/30/2017, 11/10/2018, 12/28/2021   PFIZER(Purple Top)SARS-COV-2 Vaccination 04/14/2019, 05/05/2019, 11/24/2019   PNEUMOCOCCAL CONJUGATE-20 01/18/2022   Pfizer Covid-19 Vaccine Bivalent Booster 63yrs & up 11/27/2020, 12/28/2021   Tdap 05/01/2012, 07/13/2022   Zoster Recombinant(Shingrix) 12/01/2016, 11/27/2020     Objective: Vital Signs: BP 121/75 (BP Location: Left Arm, Patient Position: Sitting, Cuff Size: Normal)   Pulse 76   Resp 15   Ht 5\' 5"  (1.651 m)   Wt 218 lb (98.9 kg)   BMI 36.28 kg/m    Physical Exam Vitals and nursing note reviewed.  Constitutional:      Appearance: She is well-developed.  HENT:     Head: Normocephalic and atraumatic.  Eyes:     Conjunctiva/sclera: Conjunctivae normal.  Cardiovascular:     Rate and  Rhythm: Normal rate and regular rhythm.     Heart sounds: Normal heart sounds.  Pulmonary:     Effort: Pulmonary effort is normal.     Breath sounds: Normal breath sounds.  Abdominal:     General: Bowel sounds are normal.     Palpations: Abdomen is soft.  Musculoskeletal:     Cervical back: Normal range of motion.  Lymphadenopathy:     Cervical: No cervical adenopathy.  Skin:    General: Skin is warm and dry.     Capillary Refill: Capillary refill takes less than 2 seconds.  Neurological:     Mental Status: She is alert and oriented to person, place, and time.  Psychiatric:        Behavior: Behavior normal.      Musculoskeletal Exam: C-spine has limited range of motion with lateral rotation.  Shoulder joints have good range of motion with no discomfort.  Flexion contracture noted in the right elbow with no tenderness or inflammation.  Wrist joints, MCPs, PIPs, DIPs have good range of motion with no synovitis.  PIP and DIP thickening consistent with osteoarthritis of both hands.  No tenderness or synovitis over MCP joints.  Complete fist formation bilaterally.  Hip joints have good range of motion with no groin pain.  Tenderness over the left trochanteric bursa.  Knee joints have good range of motion with no warmth or effusion.  Ankle joints have good range of motion with no tenderness or synovitis.  No tenderness over MTP joints.  Thickening over bilateral first MTP joints noted, right > left.   CDAI Exam: CDAI Score: -- Patient Global: 60 / 100; Provider Global: 40 / 100 Swollen: --; Tender: -- Joint Exam 08/19/2022   No joint exam has been documented for this visit   There is currently no information documented on the homunculus. Go to the Rheumatology activity and complete the homunculus joint exam.  Investigation: No additional findings.  Imaging: No results found.  Recent Labs: Lab Results  Component Value Date   WBC 5.7 05/19/2022   HGB 14.3 05/19/2022   PLT 364  05/19/2022   NA 140 05/19/2022   K 4.4 05/19/2022   CL 101 05/19/2022   CO2 28 05/19/2022   GLUCOSE 140 (H) 05/19/2022   BUN 23 05/19/2022   CREATININE 1.01 05/19/2022   BILITOT 0.4 05/19/2022   ALKPHOS 126 (H) 06/04/2021   AST 18 05/19/2022   ALT 19 05/19/2022   PROT 7.4 05/19/2022   ALBUMIN 4.4 06/04/2021   CALCIUM 10.2 05/19/2022   GFRAA 75 03/26/2020   QFTBGOLDPLUS NEGATIVE 07/18/2018    Speciality Comments: PLQ eye exam: 10/26/2021 [facility]. Follow up in 1 year.  Procedures:  No procedures performed Allergies: Amoxicillin and Codeine   Assessment / Plan:     Visit Diagnoses: Rheumatoid arthritis involving multiple sites with positive rheumatoid factor (HCC) - Positive RF, negative anti-CCP, -14 3 3  eta, ESR 57: She has no joint tenderness or synovitis on examination today.  She has not had any signs or symptoms of a rheumatoid arthritis flare.  She has clinically been doing well taking Plaquenil 200 mg 1 tablet by mouth twice daily.  She is tolerating Plaquenil without any side effects and has not missed any doses recently.  Patient has been experiencing hand cramping with repetitive activity.  On examination no active inflammation was noted.  She will remain on Plaquenil as monotherapy.  She was advised to notify us if she develops signs or symptoms of a flare.  She will follow-up in the office in 3 months or sooner if needed.  High risk medication use - Plaquenil 200 mg 1 tablet by mouth twice daily.  Gap in therapy 2020-2023.  Restarted plaquenil at the end of July 2023.  PLQ eye exam: 10/26/2021 [facility]. Follow up in 1 year.  Orders for CBC and CMP were released today. - Plan: CBC with Differential/Platelet, COMPLETE METABOLIC PANEL WITH GFR  Idiopathic chronic gout of multiple sites without tophus - She has not had any signs or symptoms of a gout flare since her last office visit.  She is taking Allopurinol 450 mg by mouth daily and colchicine  0.6 mg 1 tablet by mouth daily.  She is tolerating combination therapy without any side effects.  Allopurinol was initially started at the end of February 2024.  Her uric acid was 8.3 on 05/19/2022.  Uric acid will be updated today.   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 She may benefit from switching to losartan in the future which has shown to reduce uric acid levels. No dose adjustment will be made to her allopurinol  prescription until lab work has resulted. Discussed that eventually the goal will be for her to take colchicine on an as-needed basis but for now she will remain on colchicine 0.6 mg 1 tablet daily. - Plan: Uric acid  Primary osteoarthritis of both hands - X-rays of both hands were updated on 08/28/2021 and were consistent with osteoarthritic changes.  No erosive change.  Patient experiences hand cramping with repetitive activity.  Contracture of right elbow: Unchanged.  No tenderness or inflammation noted.   Trochanteric bursitis, left hip: She has tenderness over the left trochanteric bursa.  Patient was given a handout of exercises to perform.  Primary osteoarthritis of both knees: Good ROM of both knees.  No warmth or effusion of knee joints noted.  Primary osteoarthritis of both feet: Thickening of bilateral first MTP joints noted.  No tenderness or synovitis over MTPs at this time.  Some PIP and DIP thickening consistent with osteoarthritis of both feet noted.  DDD (degenerative disc disease), cervical - Dr. Ramos--limited range of motion with lateral rotation.  DDD (degenerative disc disease), lumbar: She is not having any increased discomfort in her lower back currently.  Other medical conditions are listed as follows:   Stage 2 chronic kidney disease  History of type 2 diabetes mellitus  History of hyperlipidemia  Essential hypertension: BP 121/75 today in the office.   Orders: Orders Placed  This Encounter  Procedures   CBC with Differential/Platelet   COMPLETE METABOLIC PANEL WITH GFR   Uric acid   No orders of the defined types were placed in this encounter.   Follow-Up Instructions: Return in about 3 months (around 11/19/2022) for Rheumatoid arthritis.   Gearldine Bienenstock, PA-C  Note - This record has been created using Dragon software.  Chart creation errors have been sought, but may not always  have been located. Such creation errors do not reflect on  the standard of medical care.

## 2022-08-08 DIAGNOSIS — I10 Essential (primary) hypertension: Secondary | ICD-10-CM | POA: Diagnosis not present

## 2022-08-08 DIAGNOSIS — E1159 Type 2 diabetes mellitus with other circulatory complications: Secondary | ICD-10-CM | POA: Diagnosis not present

## 2022-08-08 DIAGNOSIS — Z794 Long term (current) use of insulin: Secondary | ICD-10-CM | POA: Diagnosis not present

## 2022-08-14 ENCOUNTER — Other Ambulatory Visit: Payer: Self-pay | Admitting: Physician Assistant

## 2022-08-16 ENCOUNTER — Other Ambulatory Visit: Payer: Medicare Other | Admitting: Pharmacist

## 2022-08-16 MED ORDER — REPATHA SURECLICK 140 MG/ML ~~LOC~~ SOAJ: 140.0000 mg | SUBCUTANEOUS | 2 refills | Status: DC

## 2022-08-16 NOTE — Telephone Encounter (Signed)
Last Fill: 05/17/2022  Eye exam: 10/26/2021   Labs: 05/19/2022 Glucose is 140. Rest of CMP WNL. CBC stable. Uric acid remains elevated and has trended up-8.3.    Next Visit: 08/19/2022  Last Visit: 05/19/2022  [id number] arthritis involving multiple sites with positive rheumatoid factor   Current Dose per office note on 05/19/2022: Plaquenil 200 mg 1 tablet by mouth twice daily.   Okay to refill Plaquenil?

## 2022-08-16 NOTE — Progress Notes (Signed)
08/16/2022 Name: Ashley Monroe MRN: [id number] DOB: 1955/04/08  Chief Complaint  Patient presents with   Medication Management   Diabetes   Hypertension    Ashley Monroe is a 67 y.o. year old female who presented for a telephone visit.   They were referred to the pharmacist by their PCP for assistance in managing diabetes and medication access.    Subjective:  Care Team: Primary Care Provider: Dorothyann Peng, MD ; Next Scheduled Visit: 11/23/22  Medication Access/Adherence  Current Pharmacy:  [id number] - [place], [place] - [address] AT [facility] OF [address] [place] [address] Phone: [phone] Fax: [phone]   Patient reports affordability concerns with their medications: Yes  Patient reports access/transportation concerns to their pharmacy: No  Patient reports adherence concerns with their medications:  No     Diabetes:  Current medications: Ozempic 1 mg weekly, Tresiba 20 units daily, metformin 500 mg twice daily  Tolerating Ozempic well. Reports copays for Ozempic and Evaristo Bury are ~$110-140 per 3 months  Current medication access support: recently changed to Medicare from Commercial plan  Hypertension:  Current medications: olmesartan/hydrochlorothiazide 40/25 mg daily, amlodipine 10 mg daily   Hyperlipidemia/ASCVD Risk Reduction  Current lipid lowering medications: none Medications tried in the past: rosuvastatin - myopathy with elevated CK  Antiplatelet regimen: aspirin 81 mg daily  Gout: Current medications: allopurinol 450 mg daily, colchicine 0.6 mg daily  Reports gout flare over the weekend. Allopurinol dose was increased at the time of the last uric acid level. Of note, hydrochlorothiazide can increase uric acid level   Objective:  Lab Results  Component Value Date   HGBA1C 7.9 (H) 04/27/2022    Lab Results  Component Value Date   CREATININE 1.01 05/19/2022   BUN 23  05/19/2022   NA 140 05/19/2022   K 4.4 05/19/2022   CL 101 05/19/2022   CO2 28 05/19/2022    Lab Results  Component Value Date   CHOL 215 (H) 04/27/2022   HDL 45 04/27/2022   LDLCALC 131 (H) 04/27/2022   TRIG 220 (H) 04/27/2022   CHOLHDL 4.8 (H) 04/27/2022    Medications Reviewed Today     Reviewed by Alden Hipp, RPH-CPP (Pharmacist) on 08/16/22 at 1424  Med List Status: <None>   Medication Order Taking? Sig Documenting Provider Last Dose Status Informant  Accu-Chek FastClix Lancets MISC [id number]  Use as instructed to check blood sugars 2 times per day dx: E11.22 Dorothyann Peng, MD  Active   ACCU-CHEK GUIDE test strip [id number]  USE AS INSTRUCTED TO CHECK BLOOD SUGARS 2 TIMES PER DAY Dorothyann Peng, MD  Active   allopurinol (ZYLOPRIM) 300 MG tablet [id number] Yes Take 1.5 tablets (450 mg total) by mouth daily. Gearldine Bienenstock, PA-C Taking Active   amLODipine (NORVASC) 10 MG tablet [id number] Yes TAKE 1 TABLET BY MOUTH EVERY DAY Dorothyann Peng, MD Taking Active   aspirin EC 81 MG tablet [id number] Yes Take 81 mg by mouth daily. [provider] Taking Active Self  cetirizine (ZYRTEC) 10 MG tablet [id number] Yes Take 10 mg by mouth daily. [provider] Taking Active Self  colchicine 0.6 MG tablet [id number] Yes Take 1 tablet (0.6 mg total) by mouth daily. Gearldine Bienenstock, PA-C Taking Active   cyclobenzaprine (FLEXERIL) 10 MG tablet [id number] Yes Take 10 mg by mouth at bedtime as needed for muscle spasms. [provider] Taking Active Self  DULoxetine (CYMBALTA) 30 MG capsule [id number] Yes  TAKE 1 CAPSULE BY MOUTH EVERY DAY Dorothyann Peng, MD Taking Active   gabapentin (NEURONTIN) 300 MG capsule [id number] Yes TAKE 1 CAPSULE BY MOUTH THREE TIMES A DAY [provider] Taking Active   HYDROcodone-acetaminophen (NORCO) 10-325 MG tablet [id number] Yes TAKE 1 TABLET 3 TIMES A DAY BY ORAL ROUTE AS NEEDED. [provider] Taking Active    hydroxychloroquine (PLAQUENIL) 200 MG tablet [id number] Yes TAKE 1 TABLET BY MOUTH TWICE A DAY Deveshwar, Shaili, MD Taking Active   insulin degludec (TRESIBA FLEXTOUCH) 200 UNIT/ML FlexTouch Pen [id number] Yes Inject 20 Units into the skin at bedtime. Dorothyann Peng, MD Taking Active   Insulin Pen Needle (PEN NEEDLES) 32G X 4 MM MISC [id number]  Use as directed with Levemir Dorothyann Peng, MD  Active   metFORMIN (GLUCOPHAGE) 500 MG tablet [id number] Yes TAKE 1 TABLET BY MOUTH TWICE A DAY WITH FOOD Dorothyann Peng, MD Taking Active   olmesartan-hydrochlorothiazide [facility] HCT) 40-25 MG tablet [id number] Yes TAKE 1 TABLET BY MOUTH EVERY DAY Dorothyann Peng, MD Taking Active   Semaglutide, 1 MG/DOSE, 4 MG/3ML SOPN [id number] Yes Inject 1 mg into the skin once a week. Dorothyann Peng, MD Taking Active               Assessment/Plan:   Diabetes: - Currently uncontrolled and cost is now a concern - Reviewed long term cardiovascular and renal outcomes of uncontrolled blood sugar - Reviewed income. Patient qualifies for [employer] assistance. Will collaborate with technician team to apply for Ozempic 2 mg dose and Tresiba - Recommend to continue metformin at this time. Consider addition of SGLT2 if elevated UACR or drop in Scr moving forward.  - Recommend to check glucose twice daily, fasting and 2 hour post prandial  Hypertension: - Currently controlled but hydrochlorothiazide may be exacerbating gout. - Follow up with rheumatology later this week as scheduled, check uric acid. After that, will consider decreasing hydrochlorothiazide and monitoring BP at home  Hyperlipidemia/ASCVD Risk Reduction: - Currently uncontrolled.  - Reviewed long term complications of uncontrolled cholesterol - Given statin induced myopathy with rosuvastatin and resolution with medication discontinuation, will not rechallenge with statin. Considered Nexletol vs PCSK9i, however, bempedoic acid can increase uric acid  levels. Ezetimibe monotherapy unlikely to get to goal LDL. Discussed Repatha, patient is amenable. Will pursue patient assistance. Completed PA as proof of copay is required, PA approved.   Follow Up Plan: phone call in ~ 6 weeks  Catie TClearance Coots, PharmD, BCACP, CPP Clinical Pharmacist [facility] [phone]

## 2022-08-16 NOTE — Patient Instructions (Signed)
Chrisley,   It was great talking to you today!  We will apply for assistance for Ozempic 2 mg (a higher dose) and [place] from [employer]. We will work on assistance for the Repatha injectable to help lower your cholesterol.   The hydrochlorothiazide is likely increasing your uric acid levels and putting you at risk for gout flares. Let's see how your labs look this week and we can make some plans to possibly reduce the dose of hydrochlorothiazide.   Check your blood pressure twice weekly, and any time you have concerning symptoms like headache, chest pain, dizziness, shortness of breath, or vision changes.   Our goal is less than 130/80.  To appropriately check your blood pressure, make sure you do the following:  1) Avoid caffeine, exercise, or tobacco products for 30 minutes before checking. Empty your bladder. 2) Sit with your back supported in a flat-backed chair. Rest your arm on something flat (arm of the chair, table, etc). 3) Sit still with your feet flat on the floor, resting, for at least 5 minutes.  4) Check your blood pressure. Take 1-2 readings.  5) Write down these readings and bring with you to any provider appointments.  Bring your home blood pressure machine with you to a provider's office for accuracy comparison at least once a year.   Make sure you take your blood pressure medications before you come to any office visit, even if you were asked to fast for labs.   Take care!  Catie Eppie Gibson, PharmD, BCACP, CPP Clinical Pharmacist [facility] [phone]

## 2022-08-17 ENCOUNTER — Other Ambulatory Visit: Payer: Medicare Other | Admitting: Pharmacist

## 2022-08-17 ENCOUNTER — Telehealth: Payer: Self-pay

## 2022-08-17 NOTE — Telephone Encounter (Signed)
PAP application for REPATHA [facility])  has been mailed to pt home.   I will fax PCP pages once I receive pt pages    PLEASE BE ADVISED   Melanee Spry CPhT [facility] [phone]

## 2022-08-17 NOTE — Telephone Encounter (Signed)
-----   Message from Alden Hipp, RPH-CPP sent at 08/16/2022  4:00 PM EDT ----- Hi team - can we attempt Novo on line for Ozempic 2 mg and Tresiba; Field seismologist for [employer] if online doesn't go through? I completed a PA for Repatha and it was approved; we'll need to submit proof of copay with a screenshot from [facility].   Thanks!  Catie

## 2022-08-17 NOTE — Telephone Encounter (Signed)
-----   Message from Catherine T Harper, RPH-CPP sent at 08/16/2022  4:00 PM EDT ----- Hi team - can we attempt Novo on line for Ozempic 2 mg and Tresiba; mail [employer] for Repatha + Novo Nordisk if online doesn't go through? I completed a PA for Repatha and it was approved; we'll need to submit proof of copay with a screenshot from [facility].   Thanks!  Catie 

## 2022-08-17 NOTE — Telephone Encounter (Signed)
Submitted application E-FILED for OZEMPIC AND  TRESIBA FLEXTOUCHto [employer] for patient assistance PROCESSING .   Phone: [phone]  PLEASE BE ADVISED   I FAX PROVIDER PAGES FOR CHANGE IN DOSE SUBMITTED THE 1MG  INSTEAD OF 2 MG.   Georga Bora [facility] [phone]

## 2022-08-19 ENCOUNTER — Ambulatory Visit: Payer: Medicare Other | Attending: Physician Assistant | Admitting: Physician Assistant

## 2022-08-19 ENCOUNTER — Encounter: Payer: Self-pay | Admitting: Physician Assistant

## 2022-08-19 VITALS — BP 121/75 | HR 76 | Resp 15 | Ht 65.0 in | Wt 218.0 lb

## 2022-08-19 DIAGNOSIS — M24521 Contracture, right elbow: Secondary | ICD-10-CM

## 2022-08-19 DIAGNOSIS — M7062 Trochanteric bursitis, left hip: Secondary | ICD-10-CM | POA: Diagnosis not present

## 2022-08-19 DIAGNOSIS — N182 Chronic kidney disease, stage 2 (mild): Secondary | ICD-10-CM

## 2022-08-19 DIAGNOSIS — Z8639 Personal history of other endocrine, nutritional and metabolic disease: Secondary | ICD-10-CM | POA: Diagnosis not present

## 2022-08-19 DIAGNOSIS — M1A09X0 Idiopathic chronic gout, multiple sites, without tophus (tophi): Secondary | ICD-10-CM

## 2022-08-19 DIAGNOSIS — M19041 Primary osteoarthritis, right hand: Secondary | ICD-10-CM

## 2022-08-19 DIAGNOSIS — M0579 Rheumatoid arthritis with rheumatoid factor of multiple sites without organ or systems involvement: Secondary | ICD-10-CM | POA: Diagnosis not present

## 2022-08-19 DIAGNOSIS — I10 Essential (primary) hypertension: Secondary | ICD-10-CM

## 2022-08-19 DIAGNOSIS — M170 Bilateral primary osteoarthritis of knee: Secondary | ICD-10-CM | POA: Diagnosis not present

## 2022-08-19 DIAGNOSIS — M5030 Other cervical disc degeneration, unspecified cervical region: Secondary | ICD-10-CM | POA: Diagnosis not present

## 2022-08-19 DIAGNOSIS — M19072 Primary osteoarthritis, left ankle and foot: Secondary | ICD-10-CM

## 2022-08-19 DIAGNOSIS — M19071 Primary osteoarthritis, right ankle and foot: Secondary | ICD-10-CM

## 2022-08-19 DIAGNOSIS — M51369 Other intervertebral disc degeneration, lumbar region without mention of lumbar back pain or lower extremity pain: Secondary | ICD-10-CM

## 2022-08-19 DIAGNOSIS — M5136 Other intervertebral disc degeneration, lumbar region: Secondary | ICD-10-CM

## 2022-08-19 DIAGNOSIS — Z79899 Other long term (current) drug therapy: Secondary | ICD-10-CM

## 2022-08-19 DIAGNOSIS — M19042 Primary osteoarthritis, left hand: Secondary | ICD-10-CM

## 2022-08-19 NOTE — Patient Instructions (Signed)
Hip Bursitis Rehab Ask your health care provider which exercises are safe for you. Do exercises exactly as told by your health care provider and adjust them as directed. It is normal to feel mild stretching, pulling, tightness, or discomfort as you do these exercises. Stop right away if you feel sudden pain or your pain gets worse. Do not begin these exercises until told by your health care provider. Stretching exercise This exercise warms up your muscles and joints and improves the movement and flexibility of your hip. This exercise also helps to relieve pain and stiffness. Iliotibial band stretch An iliotibial band is a strong band of muscle tissue that runs from the outer side of your hip to the outer side of your thigh and knee. Lie on your side with your left / right leg in the top position. Bend your left / right knee and grab your ankle. Stretch out your bottom arm to help you balance. Slowly bring your knee back so your thigh is slightly behind your body. Slowly lower your knee toward the floor until you feel a gentle stretch on the outside of your left / right thigh. If you do not feel a stretch and your knee will not lower more toward the floor, place the heel of your other foot on top of your knee and pull your knee down toward the floor with your foot. Hold this position for __________ seconds. Slowly return to the starting position. Repeat __________ times. Complete this exercise __________ times a day. Strengthening exercises These exercises build strength and endurance in your hip and pelvis. Endurance is the ability to use your muscles for a long time, even after they get tired. Bridge This exercise strengthens the muscles that move your thigh backward (hip extensors). Lie on your back on a firm surface with your knees bent and your feet flat on the floor. Tighten your buttocks muscles and lift your buttocks off the floor until your trunk is level with your thighs. Do not arch your  back. You should feel the muscles working in your buttocks and the back of your thighs. If you do not feel these muscles, slide your feet 1-2 inches (2.5-5 cm) farther away from your buttocks. If this exercise is too easy, try doing it with your arms crossed over your chest. Hold this position for __________ seconds. Slowly lower your hips to the starting position. Let your muscles relax completely after each repetition. Repeat __________ times. Complete this exercise __________ times a day. Squats This exercise strengthens the muscles in front of your thigh and knee (quadriceps). Stand in front of a table, with your feet and knees pointing straight ahead. You may rest your hands on the table for balance but not for support. Slowly bend your knees and lower your hips like you are going to sit in a chair. Keep your weight over your heels, not over your toes. Keep your lower legs upright so they are parallel with the table legs. Do not let your hips go lower than your knees. Do not bend lower than told by your health care provider. If your hip pain increases, do not bend as low. Hold the squat position for __________ seconds. Slowly push with your legs to return to standing. Do not use your hands to pull yourself to standing. Repeat __________ times. Complete this exercise __________ times a day. Hip hike  Stand sideways on a bottom step. Stand on your left / right leg with your other foot unsupported next to   the step. You can hold on to the railing or wall for balance if needed. Keep your knees straight and your torso square. Then lift your left / right hip up toward the ceiling. Hold this position for __________ seconds. Slowly let your left / right hip lower toward the floor, past the starting position. Your foot should get closer to the floor. Do not lean or bend your knees. Repeat __________ times. Complete this exercise __________ times a day. Single leg stand This exercise increases  your balance. Without shoes, stand near a railing or in a doorway. You may hold on to the railing or door frame as needed for balance. Squeeze your left / right buttock muscles, then lift up your other foot. Do not let your left / right hip push out to the side. It is helpful to stand in front of a mirror for this exercise so you can watch your hip. Hold this position for __________ seconds. Repeat __________ times. Complete this exercise __________ times a day. This information is not intended to replace advice given to you by your health care provider. Make sure you discuss any questions you have with your health care provider. Document Revised: 01/07/2021 Document Reviewed: 01/07/2021 Elsevier Patient Education  2024 Elsevier Inc.  

## 2022-08-19 NOTE — Telephone Encounter (Signed)
-----   Message from [employer] sent at 08/16/2022  4:00 PM EDT ----- Hi team - can we attempt Novo on line for Ozempic 2 mg and Tresiba; Field seismologist for [employer] if online doesn't go through? I completed a PA for Repatha and it was approved; we'll need to submit proof of copay with a screenshot from [facility].   Thanks!  Catie

## 2022-08-20 LAB — CBC WITH DIFFERENTIAL/PLATELET
Absolute Monocytes: 453 cells/uL (ref 200–950)
Basophils Absolute: 62 cells/uL (ref 0–200)
Basophils Relative: 1.4 %
Eosinophils Absolute: 180 cells/uL (ref 15–500)
Eosinophils Relative: 4.1 %
HCT: 42.7 % (ref 35.0–45.0)
Hemoglobin: 13.9 g/dL (ref 11.7–15.5)
Lymphs Abs: 1883 cells/uL (ref 850–3900)
MCH: 27.9 pg (ref 27.0–33.0)
MCHC: 32.6 g/dL (ref 32.0–36.0)
MCV: 85.7 fL (ref 80.0–100.0)
MPV: 10.2 fL (ref 7.5–12.5)
Monocytes Relative: 10.3 %
Neutro Abs: 1822 cells/uL (ref 1500–7800)
Neutrophils Relative %: 41.4 %
Platelets: 299 10*3/uL (ref 140–400)
RBC: 4.98 10*6/uL (ref 3.80–5.10)
RDW: 13.9 % (ref 11.0–15.0)
Total Lymphocyte: 42.8 %
WBC: 4.4 10*3/uL (ref 3.8–10.8)

## 2022-08-20 LAB — COMPLETE METABOLIC PANEL WITH GFR
AG Ratio: 1.7 (calc) (ref 1.0–2.5)
ALT: 24 U/L (ref 6–29)
AST: 23 U/L (ref 10–35)
Albumin: 4.4 g/dL (ref 3.6–5.1)
Alkaline phosphatase (APISO): 103 U/L (ref 37–153)
BUN: 19 mg/dL (ref 7–25)
CO2: 28 mmol/L (ref 20–32)
Calcium: 10.2 mg/dL (ref 8.6–10.4)
Chloride: 104 mmol/L (ref 98–110)
Creat: 0.97 mg/dL (ref 0.50–1.05)
Globulin: 2.6 g/dL (calc) (ref 1.9–3.7)
Glucose, Bld: 118 mg/dL — ABNORMAL HIGH (ref 65–99)
Potassium: 4.2 mmol/L (ref 3.5–5.3)
Sodium: 140 mmol/L (ref 135–146)
Total Bilirubin: 0.4 mg/dL (ref 0.2–1.2)
Total Protein: 7 g/dL (ref 6.1–8.1)
eGFR: 65 mL/min/{1.73_m2} (ref >=60)

## 2022-08-20 LAB — URIC ACID: Uric Acid, Serum: 4.8 mg/dL (ref 2.5–7.0)

## 2022-08-20 NOTE — Progress Notes (Signed)
Glucose is 118. Rest of CMP WNL. CBC WNL.  Uric acid is within the desirable range.   Continue on current treatment regimen.

## 2022-08-24 NOTE — Telephone Encounter (Signed)
Received notification from [employer] regarding approval for [facility] AND NOVOFINE PEN NEEDLES . Patient assistance APPROVED from 08/23/2022 to 02/08/2023.  Phone: [phone]  I HAVE RECEIVE THE PROVIDER PAGES FOR OZEMPIC 2 MG AND SENT TO COMPANY FOR PROCESSING.

## 2022-08-31 NOTE — Telephone Encounter (Signed)
Received notification from [employer] regarding approval for OZEMPIC 2mg . Patient assistance approved from 0715/2024 to 02/08/23.  Phone: [phone]  Please be advised pt can call number above to check status of shipping

## 2022-09-02 NOTE — Telephone Encounter (Signed)
RECEIVE PT PAGES OF application for REPATHA to [facility] FAXED PROVIDER PAGES OFFICE OF ROBYN SANDERS.    PLEASE BE ADVISED

## 2022-09-03 ENCOUNTER — Telehealth: Payer: Self-pay

## 2022-09-03 NOTE — Telephone Encounter (Signed)
Patient called to inform patient assistance was here!

## 2022-09-09 NOTE — Telephone Encounter (Signed)
RECEIVED BOTH PT AND PROVIDER PAGES AND Submitted application for REPATHA  to [facility] for patient assistance.   Phone: [phone]  PLEASE BE ADVISED

## 2022-09-10 ENCOUNTER — Other Ambulatory Visit (HOSPITAL_COMMUNITY): Payer: Self-pay

## 2022-09-21 NOTE — Telephone Encounter (Signed)
PAP: Patient assistance application for REPATHA has been approved by PAP Companies: [employer] from 09/20/2022 to 12/31/82024. Medication should be delivered to PAP Delivery: Home For further shipping updates, please contact [employer] [phone] Pt ID is: [id number]  PLEASE BE ADVISED LETTER OF APPROVAL IS SCANNED IN MEDIA IN CHART

## 2022-10-05 ENCOUNTER — Other Ambulatory Visit: Payer: BC Managed Care – PPO | Admitting: Pharmacist

## 2022-10-05 NOTE — Patient Instructions (Signed)
Mileny,   It was great talking to you today!  Check your blood sugars twice daily:  1) Fasting, first thing in the morning before breakfast and  2) 2 hours after your largest meal.   For a goal A1c of less than 7%, goal fasting readings are less than 130 and goal 2 hour after meal readings are less than 180.    Let me know when you receive the Repatha.   Check your blood pressure periodically, and any time you have concerning symptoms like headache, chest pain, dizziness, shortness of breath, or vision changes.   Our goal is less than 130/80.  To appropriately check your blood pressure, make sure you do the following:  1) Avoid caffeine, exercise, or tobacco products for 30 minutes before checking. Empty your bladder. 2) Sit with your back supported in a flat-backed chair. Rest your arm on something flat (arm of the chair, table, etc). 3) Sit still with your feet flat on the floor, resting, for at least 5 minutes.  4) Check your blood pressure. Take 1-2 readings.  5) Write down these readings and bring with you to any provider appointments.  Bring your home blood pressure machine with you to a provider's office for accuracy comparison at least once a year.   Make sure you take your blood pressure medications before you come to any office visit, even if you were asked to fast for labs.   Take care!  Catie Eppie Gibson, PharmD, BCACP, CPP Clinical Pharmacist [facility] [phone]

## 2022-10-05 NOTE — Progress Notes (Signed)
10/05/2022 Name: Ashley Monroe MRN: [id number] DOB: Jun 30, 1955  Chief Complaint  Patient presents with   Medication Management   Diabetes    Ashley Monroe is a 67 y.o. year old female who presented for a telephone visit.   They were referred to the pharmacist by their PCP for assistance in managing diabetes.    Subjective:  Care Team: Primary Care Provider: Dorothyann Peng, MD ; Next Scheduled Visit: 11/23/22  Medication Access/Adherence  Current Pharmacy:  [id number] - [place], [place] - [address] AT [facility] [address] [place] [address] Phone: [phone] Fax: [phone]   Patient reports affordability concerns with their medications: No  Patient reports access/transportation concerns to their pharmacy: No  Patient reports adherence concerns with their medications:  No     Diabetes:  Current medications: metformin 500 mg twice daily, Ozempic 1 mg weekly, Tresiba 20 units daily   Current glucose readings: reports highest fasting lately has been ~110. No episodes of hypoglycemia. Some constipation the day after Ozempic injection.  Current meal patterns:  - Breakfast: scrambled egg, [place] bacon - Lunch: Pack of tuna, wheat bread, lemon water - Supper: avoids red meat; chicken, fish, salmon; vegetables (broccoli, greens);  - Snacks: fruit  Current physical activity: takes grandkids to practice - walks the track   Current medication access support: approved for Ozempic/Tresiba assistance through [employer]   Hypertension:  Current medications: amlodipine 10 mg daily, olmesartan/hydrochlorothiazide 40/25   Patient has a validated, automated, upper arm home BP cuff Current blood pressure readings readings: remembers a reading of 122/88 when [facility] nurse came for a home visit.   Hyperlipidemia/ASCVD Risk Reduction  Current lipid lowering medications: prescribed Repatha, has not received from  patient assistance  Medications tried in the past:  rosuvastatin - myopathy with elevated CK   Gout: Current medications: allopurinol 450 mg daily, colchicine 0.6 mg PRN  Objective:  Lab Results  Component Value Date   HGBA1C 7.9 (H) 04/27/2022    Lab Results  Component Value Date   CREATININE 0.97 08/19/2022   BUN 19 08/19/2022   NA 140 08/19/2022   K 4.2 08/19/2022   CL 104 08/19/2022   CO2 28 08/19/2022    Lab Results  Component Value Date   CHOL 215 (H) 04/27/2022   HDL 45 04/27/2022   LDLCALC 131 (H) 04/27/2022   TRIG 220 (H) 04/27/2022   CHOLHDL 4.8 (H) 04/27/2022    Medications Reviewed Today     Reviewed by Alden Hipp, RPH-CPP (Pharmacist) on 10/05/22 at 1343  Med List Status: <None>   Medication Order Taking? Sig Documenting Provider Last Dose Status Informant  Accu-Chek FastClix Lancets MISC [id number]  Use as instructed to check blood sugars 2 times per day dx: E11.22 Dorothyann Peng, MD  Active   ACCU-CHEK GUIDE test strip [id number]  USE AS INSTRUCTED TO CHECK BLOOD SUGARS 2 TIMES PER DAY Dorothyann Peng, MD  Active   allopurinol (ZYLOPRIM) 300 MG tablet [id number] Yes Take 1.5 tablets (450 mg total) by mouth daily. Gearldine Bienenstock, PA-C Taking Active   amLODipine (NORVASC) 10 MG tablet [id number] Yes TAKE 1 TABLET BY MOUTH EVERY DAY Dorothyann Peng, MD Taking Active   aspirin EC 81 MG tablet [id number] Yes Take 81 mg by mouth daily. [provider] Taking Active Self  cetirizine (ZYRTEC) 10 MG tablet [id number] Yes Take 10 mg by mouth daily. [provider] Taking Active Self  colchicine 0.6 MG  tablet [id number] Yes Take 1 tablet (0.6 mg total) by mouth daily. Gearldine Bienenstock, PA-C Taking Active   cyclobenzaprine (FLEXERIL) 10 MG tablet [id number]  Take 10 mg by mouth at bedtime as needed for muscle spasms. [provider]  Active Self  DULoxetine (CYMBALTA) 30 MG capsule [id number] Yes TAKE 1 CAPSULE BY MOUTH EVERY DAY Dorothyann Peng, MD  Taking Active   Evolocumab [facility] SURECLICK) 140 MG/ML SOAJ [id number] No Inject 140 mg into the skin every 14 (fourteen) days.  Patient not taking: Reported on 10/05/2022   Dorothyann Peng, MD Not Taking Active   gabapentin (NEURONTIN) 300 MG capsule [id number]  TAKE 1 CAPSULE BY MOUTH THREE TIMES A DAY [provider]  Active   HYDROcodone-acetaminophen (NORCO) 10-325 MG tablet [id number]  TAKE 1 TABLET 3 TIMES A DAY BY ORAL ROUTE AS NEEDED. [provider]  Active   hydroxychloroquine (PLAQUENIL) 200 MG tablet [id number] Yes TAKE 1 TABLET BY MOUTH TWICE A DAY Deveshwar, Shaili, MD Taking Active   insulin degludec (TRESIBA FLEXTOUCH) 200 UNIT/ML FlexTouch Pen [id number] Yes Inject 20 Units into the skin at bedtime. Dorothyann Peng, MD Taking Active   Insulin Pen Needle (PEN NEEDLES) 32G X 4 MM MISC [id number]  Use as directed with Levemir Dorothyann Peng, MD  Active   metFORMIN (GLUCOPHAGE) 500 MG tablet [id number] Yes TAKE 1 TABLET BY MOUTH TWICE A DAY WITH FOOD Dorothyann Peng, MD Taking Active   olmesartan-hydrochlorothiazide [facility] HCT) 40-25 MG tablet [id number] Yes TAKE 1 TABLET BY MOUTH EVERY DAY Dorothyann Peng, MD Taking Active   Semaglutide, 1 MG/DOSE, 4 MG/3ML SOPN [id number] Yes Inject 1 mg into the skin once a week. Dorothyann Peng, MD Taking Active               Assessment/Plan:   Diabetes: - Currently improved per home readings - Reviewed long term cardiovascular and renal outcomes of uncontrolled blood sugar - Reviewed goal A1c, goal fasting, and goal 2 hour post prandial glucose - Reviewed dietary modifications including: praised for focus on lean proteins, fruits and vegetables, whole grains. Encouraged hydration, physical activity to help with constipation - Discussed options to increase Ozempic to 2 mg weekly, increase metformin (through change to XR) to allow to reduce and possibly eliminate [place]. Patient elects to continue current regimen at this time and  follow next A1c.  - Recommend to check glucose twice daily, fasting and 2 hour post prandial.   Hypertension: - Currently controlled at the majority of office checks - Reviewed long term cardiovascular and renal outcomes of uncontrolled blood pressure - Reviewed appropriate blood pressure monitoring technique and reviewed goal blood pressure. Recommended to check home blood pressure and heart rate periodically - Recommend to continue current regimen at this time  Hyperlipidemia/ASCVD Risk Reduction: - Currently uncontrolled, awaiting Repatha shipment.  - Reviewed long term complications of uncontrolled cholesterol - Recommend to contact [employer] to follow up on order. Patient will notify me when Repatha arrives.    Gout: - Last uric acid at goal. Discussed impact of hydrochlorothiazide on uric acid. Patient elected to continue current regimen.   Follow Up Plan: PCP in 6 weeks, PharmD 6 weeks later  Ashley Monroe, PharmD, BCACP, CPP Clinical Pharmacist [facility] [phone]

## 2022-10-07 ENCOUNTER — Telehealth: Payer: BC Managed Care – PPO

## 2022-10-07 ENCOUNTER — Encounter: Payer: Self-pay | Admitting: *Deleted

## 2022-10-07 ENCOUNTER — Other Ambulatory Visit: Payer: BC Managed Care – PPO | Admitting: *Deleted

## 2022-10-07 NOTE — Patient Outreach (Signed)
Care Management   Visit Note  10/07/2022 Name: Ashley Monroe MRN: [id number] DOB: 08/12/1955  Subjective: Ashley Monroe is a 67 y.o. year old female who is a primary care patient of Ashley Peng, MD. The Care Management team was consulted for assistance.      Engaged with patient spoke with patient by telephone for follow up   Goals Addressed             This Visit's Progress    COMPLETED: CCM (DIABETES) EXPECTED OUTCOME: MONITOR, SELF-MANAGE AND REDUCE SYMPTOMS OF DIABETES       Current Barriers:  Knowledge Deficits related to Diabetes management Chronic Disease Management support and education needs related to Diabetes, diet, exercise Patient reports she checks CBG once daily fasting with readings in 90's range with highest reading 112 Patient does not exercise and states she will consider walking, no new concerns reported Patient reports she has all medications and taking as prescribed  Planned Interventions: Provided education to patient about basic DM disease process; Reviewed medications with patient and discussed importance of medication adherence;        Counseled on importance of regular laboratory monitoring as prescribed;        Advised patient, providing education and rationale, to check cbg once daily and record        call provider for findings outside established parameters;       Review of patient status, including review of consultants reports, relevant laboratory and other test results, and medications completed;       Reinforced instructions by primary care provider to exercise 150 minutes per week Reviewed plan of care with pt including case closure Reinforced carbohydrate modified diet  Symptom Management: Take medications as prescribed   Attend all scheduled provider appointments Call pharmacy for medication refills 3-7 days in advance of running out of medications Attend church or other social activities Perform all self care activities  independently  Perform IADL's (shopping, preparing meals, housekeeping, managing finances) independently Call provider office for new concerns or questions  check blood sugar at prescribed times: once daily check feet daily for cuts, sores or redness enter blood sugar readings and medication or insulin into daily log take the blood sugar log to all doctor visits take the blood sugar meter to all doctor visits set goal weight trim toenails straight across fill half of plate with vegetables limit fast food meals to no more than 1 per week set a realistic goal Try to exercise at least 20 minutes per day- walking is a good start Case closure        COMPLETED: CCM (HYPERTENSION) EXPECTED OUTCOME: SELF-MANAGE AND REDUCE SYMPTOMS OF HYPERTENSION       Current Barriers:  Knowledge Deficits related to Hypertension management Chronic Disease Management support and education needs related to Hypertension, diet, exercise No Advanced Directives in place- pt states she is in process of completing documents Patient reports she lives with spouse and helps with his care, pt states she has 2 adult children she can call on if needed, pt states she is independent in all aspects of her care, continues to drive, recently retired from work Patient reports she checks blood pressure once monthly Patient continues working with pharmacist as needed  Planned Interventions: Evaluation of current treatment plan related to hypertension self management and patient's adherence to plan as established by provider;   Reviewed medications with patient and discussed importance of compliance;  Counseled on the importance of exercise goals with target of  150 minutes per week Discussed plans with patient for ongoing care management follow up and provided patient with direct contact information for care management team; Advised patient, providing education and rationale, to monitor blood pressure daily and record, calling PCP  for findings outside established parameters;  Discussed complications of poorly controlled blood pressure such as heart disease, stroke, circulatory complications, vision complications, kidney impairment, sexual dysfunction;  Reviewed all upcoming scheduled appointments Reviewed importance of following low sodium diet  Symptom Management: Take medications as prescribed   Attend all scheduled provider appointments Call pharmacy for medication refills 3-7 days in advance of running out of medications Attend church or other social activities Perform all self care activities independently  Perform IADL's (shopping, preparing meals, housekeeping, managing finances) independently Call provider office for new concerns or questions  check blood pressure weekly choose a place to take my blood pressure (home, clinic or office, retail store) write blood pressure results in a log or diary Follow low sodium diet- read food labels for sodium content Case closure           Plan: No further follow up required: case closure  Irving Shows [facility], BSN [place]/ Ambulatory Care Management [phone]

## 2022-10-07 NOTE — Patient Instructions (Signed)
Visit Information  Thank you for taking time to visit with me today. Please don't hesitate to contact me if I can be of assistance to you before our next scheduled telephone appointment.  Following are the goals we discussed today:   Goals Addressed             This Visit's Progress    COMPLETED: CCM (DIABETES) EXPECTED OUTCOME: MONITOR, SELF-MANAGE AND REDUCE SYMPTOMS OF DIABETES       Current Barriers:  Knowledge Deficits related to Diabetes management Chronic Disease Management support and education needs related to Diabetes, diet, exercise Patient reports she checks CBG once daily fasting with readings in 90's range with highest reading 112 Patient does not exercise and states she will consider walking, no new concerns reported Patient reports she has all medications and taking as prescribed  Planned Interventions: Provided education to patient about basic DM disease process; Reviewed medications with patient and discussed importance of medication adherence;        Counseled on importance of regular laboratory monitoring as prescribed;        Advised patient, providing education and rationale, to check cbg once daily and record        call provider for findings outside established parameters;       Review of patient status, including review of consultants reports, relevant laboratory and other test results, and medications completed;       Reinforced instructions by primary care provider to exercise 150 minutes per week Reviewed plan of care with pt including case closure Reinforced carbohydrate modified diet  Symptom Management: Take medications as prescribed   Attend all scheduled provider appointments Call pharmacy for medication refills 3-7 days in advance of running out of medications Attend church or other social activities Perform all self care activities independently  Perform IADL's (shopping, preparing meals, housekeeping, managing finances) independently Call  provider office for new concerns or questions  check blood sugar at prescribed times: once daily check feet daily for cuts, sores or redness enter blood sugar readings and medication or insulin into daily log take the blood sugar log to all doctor visits take the blood sugar meter to all doctor visits set goal weight trim toenails straight across fill half of plate with vegetables limit fast food meals to no more than 1 per week set a realistic goal Try to exercise at least 20 minutes per day- walking is a good start Case closure        COMPLETED: CCM (HYPERTENSION) EXPECTED OUTCOME: SELF-MANAGE AND REDUCE SYMPTOMS OF HYPERTENSION       Current Barriers:  Knowledge Deficits related to Hypertension management Chronic Disease Management support and education needs related to Hypertension, diet, exercise No Advanced Directives in place- pt states she is in process of completing documents Patient reports she lives with spouse and helps with his care, pt states she has 2 adult children she can call on if needed, pt states she is independent in all aspects of her care, continues to drive, recently retired from work Patient reports she checks blood pressure once monthly Patient continues working with pharmacist as needed  Planned Interventions: Evaluation of current treatment plan related to hypertension self management and patient's adherence to plan as established by provider;   Reviewed medications with patient and discussed importance of compliance;  Counseled on the importance of exercise goals with target of 150 minutes per week Discussed plans with patient for ongoing care management follow up and provided patient with direct contact information  for care management team; Advised patient, providing education and rationale, to monitor blood pressure daily and record, calling PCP for findings outside established parameters;  Discussed complications of poorly controlled blood pressure such  as heart disease, stroke, circulatory complications, vision complications, kidney impairment, sexual dysfunction;  Reviewed all upcoming scheduled appointments Reviewed importance of following low sodium diet  Symptom Management: Take medications as prescribed   Attend all scheduled provider appointments Call pharmacy for medication refills 3-7 days in advance of running out of medications Attend church or other social activities Perform all self care activities independently  Perform IADL's (shopping, preparing meals, housekeeping, managing finances) independently Call provider office for new concerns or questions  check blood pressure weekly choose a place to take my blood pressure (home, clinic or office, retail store) write blood pressure results in a log or diary Follow low sodium diet- read food labels for sodium content Case closure            Please call the care guide team at [phone] if you need to cancel or reschedule your appointment.   If you are experiencing a Mental Health or Behavioral Health Crisis or need someone to talk to, please call the Suicide and Crisis Lifeline: 988 call the [place] National Suicide Prevention Lifeline: 6046462647 or TTY: (717)625-9267 TTY 906-216-7741) to talk to a trained counselor call 1-800-273-TALK (toll free, 24 hour hotline) go to [facility] [address] [place] [phone]) call 911   Patient verbalizes understanding of instructions and care plan provided today and agrees to view in MyChart. Active MyChart status and patient understanding of how to access instructions and care plan via MyChart confirmed with patient.     No further follow up required: case closure  Irving Shows [facility], BSN [place]/ Ambulatory Care Management [phone]

## 2022-10-25 ENCOUNTER — Other Ambulatory Visit: Payer: Self-pay | Admitting: Pharmacist

## 2022-10-25 NOTE — Patient Instructions (Addendum)
Ms. Ashley Monroe,   It was great talking to you today!  [email or web address]  Please reach out with any questions!  Catie Eppie Gibson, PharmD, BCACP, CPP Clinical Pharmacist [facility] [phone]

## 2022-10-25 NOTE — Addendum Note (Signed)
Addended by: Nilda Simmer T on: 10/25/2022 11:20 AM   Modules accepted: Level of Service

## 2022-10-25 NOTE — Progress Notes (Signed)
Care Coordination Call  Patient called and let me know that she received her Repatha. Discussed injection technique. Discussed online videos for Repatha administration. Advised to let pen come to room temperature before injection.   Follow up with PCP as scheduled.   Catie Eppie Gibson, PharmD, BCACP, CPP Clinical Pharmacist [facility] [phone]

## 2022-10-27 DIAGNOSIS — H2513 Age-related nuclear cataract, bilateral: Secondary | ICD-10-CM | POA: Diagnosis not present

## 2022-10-27 DIAGNOSIS — H04123 Dry eye syndrome of bilateral lacrimal glands: Secondary | ICD-10-CM | POA: Diagnosis not present

## 2022-10-27 DIAGNOSIS — E119 Type 2 diabetes mellitus without complications: Secondary | ICD-10-CM | POA: Diagnosis not present

## 2022-10-27 DIAGNOSIS — Z79899 Other long term (current) drug therapy: Secondary | ICD-10-CM | POA: Diagnosis not present

## 2022-10-27 LAB — HM DIABETES EYE EXAM

## 2022-10-28 ENCOUNTER — Encounter: Payer: Self-pay | Admitting: Ophthalmology

## 2022-11-04 ENCOUNTER — Other Ambulatory Visit: Payer: Self-pay | Admitting: Physician Assistant

## 2022-11-04 NOTE — Telephone Encounter (Signed)
Last Fill: 05/21/2022  Labs: 08/19/2022 Glucose is 118. Rest of CMP WNL. CBC WNL. Uric acid is within the desirable range.  Continue on current treatment regimen.  Next Visit: 11/19/2022  Last Visit: 08/19/2022  DX:  Idiopathic chronic gout of multiple sites without tophus   Current Dose per office note 08/19/2022:  Allopurinol 450 mg by mouth daily   Okay to refill Allopurinol?

## 2022-11-05 NOTE — Progress Notes (Signed)
Office Visit Note  Patient: Ashley Monroe             Date of Birth: [birth year]           MRN: [id number]             PCP: Dorothyann Peng, MD Referring: Dorothyann Peng, MD Visit Date: 11/19/2022 Occupation: @GUAROCC @  Subjective:  Pain and stiffness in both ankles   History of Present Illness: Ashley Monroe is a 67 y.o. female with history of seropositive rheumatoid arthritis and gout.  Patient remains on Plaquenil 200 mg 1 tablet by mouth twice daily.  She continues to tolerate Plaquenil without any side effects and has not missed any doses recently.  She denies any recent rheumatoid arthritis flares but has had some persistent discomfort involving the right hand and both ankles.  She states that she has stiffness while walking but denies any nocturnal pain in her ankles.  She denies any joint swelling.  She denies any signs or symptoms of a gout flare.  She has been taking allopurinol 450 mg daily and colchicine 0.6 mg 1 tablet daily.   Activities of Daily Living:  Patient reports morning stiffness for 30 minutes.   Patient Reports nocturnal pain.  Difficulty dressing/grooming: Denies Difficulty climbing stairs: Reports Difficulty getting out of chair: Reports Difficulty using hands for taps, buttons, cutlery, and/or writing: Reports  Review of Systems  Constitutional:  Positive for fatigue.  HENT:  Negative for mouth sores and mouth dryness.   Eyes:  Positive for dryness. Negative for pain and itching.  Respiratory:  Negative for shortness of breath.   Cardiovascular:  Negative for chest pain and palpitations.  Gastrointestinal:  Negative for blood in stool, constipation and diarrhea.  Endocrine: Negative for increased urination.  Genitourinary:  Negative for involuntary urination.  Musculoskeletal:  Positive for joint pain, gait problem, joint pain, myalgias, muscle weakness, morning stiffness, muscle tenderness and myalgias. Negative for joint swelling.  Skin:   Negative for color change, rash, hair loss and sensitivity to sunlight.  Allergic/Immunologic: Negative for susceptible to infections.  Neurological:  Positive for dizziness, light-headedness and headaches.  Hematological:  Negative for swollen glands.  Psychiatric/Behavioral:  Positive for depressed mood and sleep disturbance. The patient is nervous/anxious.     PMFS History:  Patient Active Problem List   Diagnosis Date Noted  . Recurrent pain of right knee 01/21/2021  . Estrogen deficiency 08/16/2020  . Syncope 08/16/2020  . Personal history of COVID-19 08/16/2020  . S/P cervical spinal fusion 07/30/2019  . Myelomalacia of cervical cord (HCC) 06/14/2019  . Essential hypertension 06/01/2019  . Rheumatoid arthritis involving multiple sites with positive rheumatoid factor (HCC) 04/25/2019  . Class 2 severe obesity due to excess calories with serious comorbidity and body mass index (BMI) of 36.0 to 36.9 in adult [facility]) 04/25/2019  . DDD (degenerative disc disease), cervical 03/15/2019  . Carpal tunnel syndrome of right wrist 12/08/2018  . Cervical post-laminectomy syndrome 08/28/2018  . Stenosis of cervical spine with myelopathy (HCC) 08/23/2017  . Hyperlipidemia 08/19/2016  . Chest pain 08/12/2016  . Chronic kidney disease, stage 2 (mild) 08/12/2016  . Hypertensive nephropathy 08/12/2016  . 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 08/12/2016  . Other long term (current) drug therapy 08/12/2016  . Type 2 diabetes mellitus with diabetic chronic kidney disease (HCC) 08/12/2016    Past Medical History:  Diagnosis Date  . Allergy   . Arthritis    RA  . Carpal tunnel syndrome   .  Chronic kidney disease   . Diabetes mellitus without complication (HCC)   . Essential hypertension 06/01/2019  . Hyperlipidemia 08/19/2016  . Hypertension   . PONV (postoperative nausea and vomiting)     Family History  Problem Relation Age of Onset  . Atrial fibrillation Mother   . Stroke  Mother   . Heart attack Mother   . Aneurysm Mother   . Atrial fibrillation Brother   . Stroke Brother   . Diabetes Brother   . Diabetes Father   . Alzheimer's disease Father   . Diabetes Brother   . Alzheimer's disease Maternal Grandmother   . Heart attack Maternal Grandfather   . Cancer Paternal Grandmother   . Healthy Son   . Healthy Daughter    Past Surgical History:  Procedure Laterality Date  . ABDOMINAL HYSTERECTOMY  2003  . ANTERIOR CERVICAL DECOMP/DISCECTOMY FUSION N/A 07/30/2019   Procedure: Anterior Cervicql Decompression Fusion - Cervcial four-Cervical five - Cervical five-Cervical six;  Surgeon: Tia Alert, MD;  Location: [facility] OR;  Service: Neurosurgery;  Laterality: N/A;  3C  . CERVICAL SPINE SURGERY  08/2017  . CHOLECYSTECTOMY  1982  . OVARIAN CYST REMOVAL  1981   Social History   Social History Narrative  . Not on file   Immunization History  Administered Date(s) Administered  . DTaP 05/02/2012  . Influenza, High Dose Seasonal PF 10/26/2022  . Influenza,inj,Quad PF,6+ Mos 11/10/2018, 10/22/2019, 11/07/2020  . Influenza-Unspecified 11/30/2017, 11/10/2018, 12/28/2021  . PFIZER(Purple Top)SARS-COV-2 Vaccination 04/14/2019, 05/05/2019, 11/24/2019  . PNEUMOCOCCAL CONJUGATE-20 01/18/2022  . [employer], [employer] 50yrs & up 11/27/2020, 12/28/2021, 10/26/2022  . Rsv, Bivalent, Protein Subunit Rsvpref,pf Verdis Frederickson) 10/26/2022  . Tdap 05/01/2012, 07/13/2022  . Zoster Recombinant(Shingrix) 12/01/2016, 11/27/2020     Objective: Vital Signs: BP 118/79 (BP Location: Left Arm, Patient Position: Sitting, Cuff Size: Normal)   Pulse 88   Resp 15   Ht 5\' 5"  (1.651 m)   Wt 217 lb 3.2 oz (98.5 kg)   BMI 36.14 kg/m    Physical Exam Vitals and nursing note reviewed.  Constitutional:      Appearance: She is well-developed.  HENT:     Head: Normocephalic and atraumatic.  Eyes:     Conjunctiva/sclera: Conjunctivae normal.  Cardiovascular:     Rate  and Rhythm: Normal rate and regular rhythm.     Heart sounds: Normal heart sounds.  Pulmonary:     Effort: Pulmonary effort is normal.     Breath sounds: Normal breath sounds.  Abdominal:     General: Bowel sounds are normal.     Palpations: Abdomen is soft.  Musculoskeletal:     Cervical back: Normal range of motion.  Lymphadenopathy:     Cervical: No cervical adenopathy.  Skin:    General: Skin is warm and dry.     Capillary Refill: Capillary refill takes less than 2 seconds.  Neurological:     Mental Status: She is alert and oriented to person, place, and time.  Psychiatric:        Behavior: Behavior normal.     Musculoskeletal Exam: C-spine has limited range of motion without rotation. Thoracic spine and lumbar spine good ROM.  Shoulder joints, elbow joints, wrist joints, MCPs, PIPs, and DIPs good ROM with no synovitis. Tenderness along the extensor tendon of the right 5th digit.  Complete fist formation bilaterally.  Hip joints have good ROM with no groin pain.  Knee joints have good ROM with no warmth or effusion.  Mild  tenderness of both ankles but no warmth or swelling noted.  No evidence of achilles tendonitis or plantar fasciitis. No tenderness or synovitis of MTP joints.   CDAI Exam: CDAI Score: 6  Patient Global: 30 / 100; Provider Global: 30 / 100 Swollen: 0 ; Tender: 2  Joint Exam 11/19/2022      Right  Left  Ankle   Tender   Tender     Investigation: No additional findings.  Imaging: No results found.  Recent Labs: Lab Results  Component Value Date   WBC 4.4 08/19/2022   HGB 13.9 08/19/2022   PLT 299 08/19/2022   NA 140 08/19/2022   K 4.2 08/19/2022   CL 104 08/19/2022   CO2 28 08/19/2022   GLUCOSE 118 (H) 08/19/2022   BUN 19 08/19/2022   CREATININE 0.97 08/19/2022   BILITOT 0.4 08/19/2022   ALKPHOS 126 (H) 06/04/2021   AST 23 08/19/2022   ALT 24 08/19/2022   PROT 7.0 08/19/2022   ALBUMIN 4.4 06/04/2021   CALCIUM 10.2 08/19/2022   GFRAA 75  03/26/2020   QFTBGOLDPLUS NEGATIVE 07/18/2018    Speciality Comments: PLQ eye exam: 10/27/2022 WNL [facility]. Follow up in 1 year.  Procedures:  No procedures performed Allergies: Amoxicillin and Codeine     Assessment / Plan:     Visit Diagnoses: Rheumatoid arthritis involving multiple sites with positive rheumatoid factor (HCC) - Positive RF, negative anti-CCP, -14 3 3  eta, ESR 57: Patient has no synovitis on examination today.  She experiences some tenderness along the right fifth metacarpal--no obvious extensor tenosynovitis was noted on examination today.  She has also had some discomfort and stiffness involving both ankle joints but has no active inflammation on exam.  Offered to update sed rate and CRP today but she would like to hold off and have these labs drawn with her routine lab work in December.  Future orders were placed today.  She is advised to notify us if her symptoms persist or worsen.  Overall she has noticed a significant improvement in her symptoms while taking Plaquenil 200 mg 1 tablet by mouth twice daily.  She continues to tolerate Plaquenil without any side effects and has not missed any doses recently.  She will remain on Plaquenil as monotherapy.  She was advised to notify us if her symptoms persist or worsen.  She will follow-up in the office in 3 months or sooner if needed.- Plan: Sedimentation rate, C-reactive protein  High risk medication use - Plaquenil 200 mg 1 tablet by mouth twice daily.  Gap in therapy 2020-2023.  Restarted plaquenil at the end of July 2023.  PLQ eye exam: 10/27/2022 [facility]. Follow up in 1 year.  CBC and CMP updated on 08/19/22.  Her next lab work will be due in December and every 5 months. Standing orders for CBC and CMP remain in place.   Idiopathic chronic gout of multiple sites without tophus - Uric acid 4.8--within the desirable range on 08/19/22.   She is taking Allopurinol 450 mg by mouth daily and colchicine  0.6 mg 1 tablet by mouth daily.  She has not had any signs or symptoms of a gout flare.  No medication changes will be made at this time.  She was vies to notify us if she develop signs or symptoms of a flare.  A future order for uric acid will be placed to be drawn with her next lab work.- Plan: Uric acid  Primary osteoarthritis of both  hands - X-rays of both hands were updated on 08/28/2021 and were consistent with osteoarthritic changes.  No erosive change.  Contracture of right elbow  Trochanteric bursitis, left hip: Not currently symptomatic.  Primary osteoarthritis of both knees: She has good range of motion of both knee joints on examination today.  No warmth or effusion noted.  Primary osteoarthritis of both feet: She experiences some soreness and stiffness in both ankle joints.  Her symptoms are exacerbated by walking prolonged distances.  She has not had any nocturnal pain.  She has not noticed any joint swelling.  No synovitis noted on exam.  Plan to update sed rate and CRP with next lab work.  DDD (degenerative disc disease), cervical - Dr. Ramos-Limited ROM with lateral rotation.   DDD (degenerative disc disease), lumbar: No symptoms of radiculopathy at this time.   Other medical conditions are listed as follows:   Stage 2 chronic kidney disease  History of hyperlipidemia  History of type 2 diabetes mellitus  Essential hypertension: BP was 118/79 today in the office.   Orders: Orders Placed This Encounter  Procedures  . Sedimentation rate  . C-reactive protein  . Uric acid   No orders of the defined types were placed in this encounter.     Follow-Up Instructions: Return in about 3 months (around 02/19/2023) for Rheumatoid arthritis, Gout.   Gearldine Bienenstock, PA-C  Note - This record has been created using Dragon software.  Chart creation errors have been sought, but may not always  have been located. Such creation errors do not reflect on  the standard of  medical care.

## 2022-11-08 ENCOUNTER — Other Ambulatory Visit: Payer: Self-pay | Admitting: Physician Assistant

## 2022-11-08 ENCOUNTER — Other Ambulatory Visit: Payer: Self-pay | Admitting: Rheumatology

## 2022-11-08 DIAGNOSIS — M1A09X0 Idiopathic chronic gout, multiple sites, without tophus (tophi): Secondary | ICD-10-CM

## 2022-11-08 NOTE — Telephone Encounter (Signed)
Last Fill: 08/16/2022  Eye exam: 10/27/2022 WNL    Labs: 08/19/2022 Glucose is 118. Rest of CMP WNL. CBC WNL. Uric acid is within the desirable range.    Next Visit: 11/19/2022  Last Visit: 08/19/2022  KG:[id number] arthritis involving multiple sites with positive rheumatoid factor   Current Dose per office note 08/19/2022: Plaquenil 200 mg 1 tablet by mouth twice daily   Okay to refill Plaquenil?

## 2022-11-08 NOTE — Telephone Encounter (Signed)
Last Fill: 04/07/2022  Labs:  08/19/2022 Glucose is 118. Rest of CMP WNL. CBC WNL. Uric acid is within the desirable range.  Continue on current treatment regimen.  Next Visit: 11/19/2022  Last Visit: 08/19/2022  DX: Idiopathic chronic gout of multiple sites without tophus   Current Dose per office note 08/19/2022: colchicine 0.6 mg 1 tablet by mouth daily.   Okay to refill Colchicine?

## 2022-11-10 ENCOUNTER — Encounter: Payer: Self-pay | Admitting: Pharmacist

## 2022-11-10 NOTE — Progress Notes (Signed)
Pharmacy Quality Measure Review  This patient is appearing on report for being at risk of failing the measure for Statin Use in Persons with Diabetes (SUPD) medications this calendar year.   Prior trials of: rosuvastatin - myopathy and CK elevations.   Placed reminder in PCP appointment note to use Statin myopathy - G72.0, T46.6X5A in next appointment.   Catie Eppie Gibson, PharmD, BCACP, CPP Clinical Pharmacist [facility] [phone]

## 2022-11-19 ENCOUNTER — Ambulatory Visit: Payer: Medicare Other | Attending: Physician Assistant | Admitting: Physician Assistant

## 2022-11-19 ENCOUNTER — Encounter: Payer: Self-pay | Admitting: Physician Assistant

## 2022-11-19 VITALS — BP 118/79 | HR 88 | Resp 15 | Ht 65.0 in | Wt 217.2 lb

## 2022-11-19 DIAGNOSIS — M24521 Contracture, right elbow: Secondary | ICD-10-CM

## 2022-11-19 DIAGNOSIS — Z79899 Other long term (current) drug therapy: Secondary | ICD-10-CM | POA: Diagnosis not present

## 2022-11-19 DIAGNOSIS — M0579 Rheumatoid arthritis with rheumatoid factor of multiple sites without organ or systems involvement: Secondary | ICD-10-CM

## 2022-11-19 DIAGNOSIS — M19042 Primary osteoarthritis, left hand: Secondary | ICD-10-CM

## 2022-11-19 DIAGNOSIS — Z8639 Personal history of other endocrine, nutritional and metabolic disease: Secondary | ICD-10-CM | POA: Diagnosis not present

## 2022-11-19 DIAGNOSIS — M19071 Primary osteoarthritis, right ankle and foot: Secondary | ICD-10-CM

## 2022-11-19 DIAGNOSIS — M5030 Other cervical disc degeneration, unspecified cervical region: Secondary | ICD-10-CM

## 2022-11-19 DIAGNOSIS — M5136 Other intervertebral disc degeneration, lumbar region: Secondary | ICD-10-CM

## 2022-11-19 DIAGNOSIS — M1A09X0 Idiopathic chronic gout, multiple sites, without tophus (tophi): Secondary | ICD-10-CM

## 2022-11-19 DIAGNOSIS — M19041 Primary osteoarthritis, right hand: Secondary | ICD-10-CM | POA: Diagnosis not present

## 2022-11-19 DIAGNOSIS — M51369 Other intervertebral disc degeneration, lumbar region without mention of lumbar back pain or lower extremity pain: Secondary | ICD-10-CM | POA: Diagnosis not present

## 2022-11-19 DIAGNOSIS — M7062 Trochanteric bursitis, left hip: Secondary | ICD-10-CM

## 2022-11-19 DIAGNOSIS — M19072 Primary osteoarthritis, left ankle and foot: Secondary | ICD-10-CM

## 2022-11-19 DIAGNOSIS — M170 Bilateral primary osteoarthritis of knee: Secondary | ICD-10-CM | POA: Diagnosis not present

## 2022-11-19 DIAGNOSIS — N182 Chronic kidney disease, stage 2 (mild): Secondary | ICD-10-CM | POA: Diagnosis not present

## 2022-11-19 DIAGNOSIS — I10 Essential (primary) hypertension: Secondary | ICD-10-CM

## 2022-11-19 NOTE — Patient Instructions (Signed)
Standing Labs We placed an order today for your standing lab work.   Please have your standing labs drawn in December  Please have your labs drawn 2 weeks prior to your appointment so that the provider can discuss your lab results at your appointment, if possible.  Please note that you may see your imaging and lab results in MyChart before we have reviewed them. We will contact you once all results are reviewed. Please allow our office up to 72 hours to thoroughly review all of the results before contacting the office for clarification of your results.  WALK-IN LAB HOURS  Monday through Thursday from 8:00 am -12:30 pm and 1:00 pm-5:00 pm and Friday from 8:00 am-12:00 pm.  Patients with office visits requiring labs will be seen before walk-in labs.  You may encounter longer than normal wait times. Please allow additional time. Wait times may be shorter on  Monday and Thursday afternoons.  We do not book appointments for walk-in labs. We appreciate your patience and understanding with our staff.   Labs are drawn by [facility]. Please bring your co-pay at the time of your lab draw.  You may receive a bill from [facility] for your lab work.  Please note if you are on Hydroxychloroquine and and an order has been placed for a Hydroxychloroquine level,  you will need to have it drawn 4 hours or more after your last dose.  If you wish to have your labs drawn at another location, please call the office 24 hours in advance so we can fax the orders.  The office is located at [address], [place], [place] [address]   If you have any questions regarding directions or hours of operation,  please call [phone].   As a reminder, please drink plenty of water prior to coming for your lab work. Thanks!

## 2022-11-22 NOTE — Progress Notes (Signed)
I,Ashley Monroe, CMA,acting as a Neurosurgeon for Ashley Aliment, MD.,have documented all relevant documentation on the behalf of Ashley Aliment, MD,as directed by  Ashley Aliment, MD while in the presence of Ashley Aliment, MD.  Subjective:  Patient ID: Ashley Monroe , female    DOB: 03-31-1955 , 67 y.o.   MRN: [id number]  Chief Complaint  Patient presents with   Diabetes   Hypertension    HPI  Patient is here for diabetes and blood pressure f/u. She reports compliance with meds. She denies headaches, chest pain and shortness of breath.  She reports when she bends down to her lower cabinets at home & comes back up she experiences feeling light-headed. She states this does happen every day.   Diabetes She presents for her follow-up diabetic visit. She has type 2 diabetes mellitus. Her disease course has been stable. Hypoglycemia symptoms include dizziness. Pertinent negatives for diabetes include no blurred vision and no chest pain. There are no hypoglycemic complications. Risk factors for coronary artery disease include diabetes mellitus, dyslipidemia, hypertension, obesity and sedentary lifestyle. She is following a generally healthy diet. She participates in exercise intermittently. Her breakfast blood glucose is taken between 7-8 am. Her breakfast blood glucose range is generally 130-140 mg/dl. An ACE inhibitor/angiotensin II receptor blocker is being taken. Eye exam is current.  Hypertension This is a chronic problem. The current episode started more than 1 year ago. The problem has been gradually improving since onset. The problem is controlled. Pertinent negatives include no blurred vision, chest pain, palpitations or shortness of breath.     Past Medical History:  Diagnosis Date   Allergy    Arthritis    RA   Carpal tunnel syndrome    Chronic kidney disease    Diabetes mellitus without complication (HCC)    Essential hypertension 06/01/2019   Hyperlipidemia 08/19/2016    Hypertension    PONV (postoperative nausea and vomiting)      Family History  Problem Relation Age of Onset   Atrial fibrillation Mother    Stroke Mother    Heart attack Mother    Aneurysm Mother    Atrial fibrillation Brother    Stroke Brother    Diabetes Brother    Diabetes Father    Alzheimer's disease Father    Diabetes Brother    Alzheimer's disease Maternal Grandmother    Heart attack Maternal Grandfather    Cancer Paternal Grandmother    Healthy Son    Healthy Daughter      Current Outpatient Medications:    Accu-Chek FastClix Lancets MISC, Use as instructed to check blood sugars 2 times per day dx: E11.22, Disp: 300 each, Rfl: 2   ACCU-CHEK GUIDE test strip, USE AS INSTRUCTED TO CHECK BLOOD SUGARS 2 TIMES PER DAY, Disp: 200 strip, Rfl: 4   allopurinol (ZYLOPRIM) 300 MG tablet, TAKE 1.5 TABLETS BY MOUTH DAILY., Disp: 135 tablet, Rfl: 0   aspirin EC 81 MG tablet, Take 81 mg by mouth daily., Disp: , Rfl:    cetirizine (ZYRTEC) 10 MG tablet, Take 10 mg by mouth daily., Disp: , Rfl:    colchicine 0.6 MG tablet, TAKE 1 TABLET BY MOUTH EVERY DAY, Disp: 90 tablet, Rfl: 1   cyclobenzaprine (FLEXERIL) 10 MG tablet, Take 10 mg by mouth at bedtime as needed for muscle spasms., Disp: , Rfl: 0   Evolocumab (REPATHA SURECLICK) 140 MG/ML SOAJ, Inject 140 mg into the skin every 14 (fourteen) days., Disp: 2 mL,  Rfl: 2   gabapentin (NEURONTIN) 300 MG capsule, TAKE 1 CAPSULE BY MOUTH THREE TIMES A DAY, Disp: , Rfl:    HYDROcodone-acetaminophen (NORCO) 10-325 MG tablet, TAKE 1 TABLET 3 TIMES A DAY BY ORAL ROUTE AS NEEDED., Disp: , Rfl:    hydroxychloroquine (PLAQUENIL) 200 MG tablet, TAKE 1 TABLET BY MOUTH TWICE A DAY, Disp: 180 tablet, Rfl: 0   insulin degludec (TRESIBA FLEXTOUCH) 200 UNIT/ML FlexTouch Pen, Inject 20 Units into the skin at bedtime., Disp: 15 mL, Rfl: 3   Insulin Pen Needle (PEN NEEDLES) 32G X 4 MM MISC, Use as directed with Levemir, Disp: 150 each, Rfl: 2   metFORMIN  (GLUCOPHAGE) 500 MG tablet, TAKE 1 TABLET BY MOUTH TWICE A DAY WITH FOOD, Disp: 180 tablet, Rfl: 2   olmesartan-hydrochlorothiazide (BENICAR HCT) 40-25 MG tablet, TAKE 1 TABLET BY MOUTH EVERY DAY, Disp: 90 tablet, Rfl: 1   Semaglutide, 1 MG/DOSE, 4 MG/3ML SOPN, Inject 1 mg into the skin once a week., Disp: 9 mL, Rfl: 3   amLODipine (NORVASC) 10 MG tablet, Take 1 tablet (10 mg total) by mouth daily., Disp: 90 tablet, Rfl: 2   DULoxetine (CYMBALTA) 30 MG capsule, Take 1 capsule (30 mg total) by mouth daily. TAKE 1 CAPSULE BY MOUTH EVERY DAY, Disp: 90 capsule, Rfl: 2   Allergies  Allergen Reactions   Amoxicillin Hives   Codeine Hives     Review of Systems  Constitutional: Negative.   Eyes:  Negative for blurred vision.  Respiratory: Negative.  Negative for shortness of breath.   Cardiovascular: Negative.  Negative for chest pain and palpitations.  Gastrointestinal: Negative.   Neurological:  Positive for dizziness.       She reports she has been having dizziness when she bends down. Sx occur when she is standing back up. She states she starts to feel light-headed. Otherwise, she feels fine. Sx have persisted in the recent weeks and sx occur daily.     Psychiatric/Behavioral: Negative.       Today's Vitals   11/23/22 1043  BP: 122/82  Pulse: 88  Temp: 98.1 F (36.7 C)  SpO2: 98%  Weight: 215 lb (97.5 kg)  Height: 5\' 5"  (1.651 m)   Body mass index is 35.78 kg/m.  Wt Readings from Last 3 Encounters:  11/23/22 215 lb (97.5 kg)  11/19/22 217 lb 3.2 oz (98.5 kg)  08/19/22 218 lb (98.9 kg)     Objective:  Physical Exam Vitals and nursing note reviewed.  Constitutional:      Appearance: Normal appearance.  HENT:     Head: Normocephalic and atraumatic.  Eyes:     Extraocular Movements: Extraocular movements intact.  Cardiovascular:     Rate and Rhythm: Normal rate and regular rhythm.     Heart sounds: Normal heart sounds.  Pulmonary:     Effort: Pulmonary effort is normal.      Breath sounds: Normal breath sounds.  Musculoskeletal:     Cervical back: Normal range of motion.  Skin:    General: Skin is warm.  Neurological:     General: No focal deficit present.     Mental Status: She is alert.  Psychiatric:        Mood and Affect: Mood normal.        Behavior: Behavior normal.         Assessment And Plan:  Type 2 diabetes mellitus with stage 2 chronic kidney disease, without long-term current use of insulin (HCC) Assessment & Plan: Chronic, she  is currently on Tresiba 20 units nightly, Ozempic 1mg  weekly  and metformin 500mg  twice daily. Will consider increasing Ozempic in hopes of decreasing insulin requirements. Importance of dietary compliance was discussed with the patient.   Orders: -     Lipid panel -     Hemoglobin A1c -     BMP8+eGFR  Hypertensive nephropathy Assessment & Plan: Chronic, fair control. Goal BP<120/80. Encouraged to follow low sodium diet. She will continue with olmesartan/hct 40/25mg  daily and amlodipine 10mg  daily.    Pure hypercholesterolemia Assessment & Plan: Chronic, not on statin therapy due to myopathy. She is now on Repatha, CCM pharmacy assistance is appreciated.  LDL goal is less than 70.   Orders: -     Lipid panel  Dizziness Assessment & Plan: She is encouraged to stay well hydrated.  Orthostatics were performed; however, pulse was not documented. I will have her return next week for NV to perform full set of orthostatics.  In the meantime, I  will check CT brain for further evaluation.   Orders: -     CT HEAD WO CONTRAST ( ); Future  Statin myopathy  Class 2 severe obesity due to excess calories with serious comorbidity and body mass index (BMI) of 35.0 to 35.9 in adult [facility]) Assessment & Plan: She was congratulated for losing 3 lbs since July. Encouraged to strive to lose ten percent of her body weight to decrease cardiac risk.    Other orders -     amLODIPine Besylate; Take 1 tablet (10 mg total)  by mouth daily.  Dispense: 90 tablet; Refill: 2 -     DULoxetine HCl; Take 1 capsule (30 mg total) by mouth daily. TAKE 1 CAPSULE BY MOUTH EVERY DAY  Dispense: 90 capsule; Refill: 2  She is encouraged to strive for BMI less than 30 to decrease cardiac risk. Advised to aim for at least 150 minutes of exercise per week.    Return for 4 month bp & dm .  Patient was given opportunity to ask questions. Patient verbalized understanding of the plan and was able to repeat key elements of the plan. All questions were answered to their satisfaction.    I, Ashley Aliment, MD, have reviewed all documentation for this visit. The documentation on 11/23/22 for the exam, diagnosis, procedures, and orders are all accurate and complete.  IF YOU HAVE BEEN REFERRED TO A SPECIALIST, IT MAY TAKE 1-2 WEEKS TO SCHEDULE/PROCESS THE REFERRAL. IF YOU HAVE NOT HEARD FROM US/SPECIALIST IN TWO WEEKS, PLEASE GIVE [place] A CALL AT [phone] X [phone].   THE PATIENT IS ENCOURAGED TO PRACTICE SOCIAL DISTANCING DUE TO THE COVID-19 PANDEMIC.

## 2022-11-22 NOTE — Patient Instructions (Signed)

## 2022-11-23 ENCOUNTER — Encounter: Payer: Self-pay | Admitting: Internal Medicine

## 2022-11-23 ENCOUNTER — Ambulatory Visit (INDEPENDENT_AMBULATORY_CARE_PROVIDER_SITE_OTHER): Payer: Medicare Other | Admitting: Internal Medicine

## 2022-11-23 VITALS — BP 122/82 | HR 88 | Temp 98.1°F | Ht 65.0 in | Wt 215.0 lb

## 2022-11-23 DIAGNOSIS — I10 Essential (primary) hypertension: Secondary | ICD-10-CM | POA: Diagnosis not present

## 2022-11-23 DIAGNOSIS — Z6835 Body mass index (BMI) 35.0-35.9, adult: Secondary | ICD-10-CM

## 2022-11-23 DIAGNOSIS — R42 Dizziness and giddiness: Secondary | ICD-10-CM | POA: Diagnosis not present

## 2022-11-23 DIAGNOSIS — I129 Hypertensive chronic kidney disease with stage 1 through stage 4 chronic kidney disease, or unspecified chronic kidney disease: Secondary | ICD-10-CM | POA: Diagnosis not present

## 2022-11-23 DIAGNOSIS — G720 Drug-induced myopathy: Secondary | ICD-10-CM

## 2022-11-23 DIAGNOSIS — T466X5A Adverse effect of antihyperlipidemic and antiarteriosclerotic drugs, initial encounter: Secondary | ICD-10-CM

## 2022-11-23 DIAGNOSIS — E66812 Obesity, class 2: Secondary | ICD-10-CM

## 2022-11-23 DIAGNOSIS — E1122 Type 2 diabetes mellitus with diabetic chronic kidney disease: Secondary | ICD-10-CM | POA: Diagnosis not present

## 2022-11-23 DIAGNOSIS — E7800 Pure hypercholesterolemia, unspecified: Secondary | ICD-10-CM | POA: Diagnosis not present

## 2022-11-23 DIAGNOSIS — N182 Chronic kidney disease, stage 2 (mild): Secondary | ICD-10-CM | POA: Diagnosis not present

## 2022-11-23 MED ORDER — AMLODIPINE BESYLATE 10 MG PO TABS: 10.0000 mg | ORAL_TABLET | Freq: Every day | ORAL | 2 refills | Status: DC

## 2022-11-23 MED ORDER — DULOXETINE HCL 30 MG PO CPEP: 30.0000 mg | ORAL_CAPSULE | Freq: Every day | ORAL | 2 refills | Status: DC

## 2022-11-24 LAB — BMP8+EGFR
BUN/Creatinine Ratio: 16 (ref 12–28)
BUN: 16 mg/dL (ref 8–27)
CO2: 24 mmol/L (ref 20–29)
Calcium: 10 mg/dL (ref 8.7–10.3)
Chloride: 103 mmol/L (ref 96–106)
Creatinine, Ser: 1.02 mg/dL — ABNORMAL HIGH (ref 0.57–1.00)
Glucose: 103 mg/dL — ABNORMAL HIGH (ref 70–99)
Potassium: 4.8 mmol/L (ref 3.5–5.2)
Sodium: 144 mmol/L (ref 134–144)
eGFR: 61 mL/min/{1.73_m2} (ref >59)

## 2022-11-24 LAB — HEMOGLOBIN A1C
Est. average glucose Bld gHb Est-mCnc: 140 mg/dL
Hgb A1c MFr Bld: 6.5 % — ABNORMAL HIGH (ref 4.8–5.6)

## 2022-11-24 LAB — LIPID PANEL
Chol/HDL Ratio: 3.4 {ratio} (ref 0.0–4.4)
Cholesterol, Total: 141 mg/dL (ref 100–199)
HDL: 42 mg/dL (ref >39)
LDL Chol Calc (NIH): 66 mg/dL (ref 0–99)
Triglycerides: 198 mg/dL — ABNORMAL HIGH (ref 0–149)
VLDL Cholesterol Cal: 33 mg/dL (ref 5–40)

## 2022-11-25 ENCOUNTER — Other Ambulatory Visit (HOSPITAL_COMMUNITY): Payer: Self-pay

## 2022-11-27 DIAGNOSIS — R42 Dizziness and giddiness: Secondary | ICD-10-CM | POA: Insufficient documentation

## 2022-11-27 NOTE — Assessment & Plan Note (Signed)
Chronic, fair control. Goal BP<120/80. Encouraged to follow low sodium diet. She will continue with olmesartan/hct 40/25mg  daily and amlodipine 10mg  daily.

## 2022-11-27 NOTE — Assessment & Plan Note (Addendum)
She is encouraged to stay well hydrated.  Orthostatics were performed; however, pulse was not documented. I will have her return next week for NV to perform full set of orthostatics.  In the meantime, I  will check CT brain for further evaluation.

## 2022-11-27 NOTE — Assessment & Plan Note (Signed)
Chronic, she is currently on Tresiba 20 units nightly, Ozempic 1mg  weekly  and metformin 500mg  twice daily. Will consider increasing Ozempic in hopes of decreasing insulin requirements. Importance of dietary compliance was discussed with the patient.

## 2022-11-27 NOTE — Assessment & Plan Note (Signed)
She was congratulated for losing 3 lbs since July. Encouraged to strive to lose ten percent of her body weight to decrease cardiac risk.

## 2022-11-27 NOTE — Assessment & Plan Note (Addendum)
Chronic, not on statin therapy due to myopathy. She is now on Repatha, CCM pharmacy assistance is appreciated.  LDL goal is less than 70.

## 2022-12-03 ENCOUNTER — Ambulatory Visit
Admission: RE | Admit: 2022-12-03 | Discharge: 2022-12-03 | Disposition: A | Discharge status: Home / Self Care | Payer: Medicare Other | Source: Ambulatory Visit | Attending: Internal Medicine | Admitting: Internal Medicine

## 2022-12-03 ENCOUNTER — Ambulatory Visit: Payer: Medicare Other

## 2022-12-03 VITALS — BP 116/64 | HR 89 | Temp 98.5°F | Ht 65.0 in | Wt 215.0 lb

## 2022-12-03 DIAGNOSIS — R42 Dizziness and giddiness: Secondary | ICD-10-CM

## 2022-12-03 NOTE — Progress Notes (Signed)
Patient presents today for orthos- patient reports dizziness for about a year. She reports she is going for a CT after this. Orthostatic Vitals for the past 48 hrs (Last 6 readings):  Orthostatic BP Orthostatic Pulse  12/03/22 0850 120/60 79  12/03/22 0851 110/60 95  12/03/22 0852 116/64 89   Per provider- BP is not her issues - continue with current meds.

## 2022-12-13 DIAGNOSIS — Z1231 Encounter for screening mammogram for malignant neoplasm of breast: Secondary | ICD-10-CM | POA: Diagnosis not present

## 2022-12-13 LAB — HM MAMMOGRAPHY

## 2022-12-16 ENCOUNTER — Encounter: Payer: Self-pay | Admitting: Internal Medicine

## 2022-12-22 ENCOUNTER — Other Ambulatory Visit: Payer: Self-pay | Admitting: Pharmacist

## 2022-12-22 NOTE — Progress Notes (Addendum)
Pharmacy Medication Assistance Program Note    12/22/2022  Patient ID: Ashley Monroe, female  DOB: Oct 08, 1955, 67 y.o.  MRN:  [id number]     12/22/2022  Outreach Medication Three  Initial Outreach Date (Medication Three) 12/20/2022  Manufacturer Medication Three Novo Nordisk  Nordisk Drugs Pen Needles  Type of Assistance Manufacturer Assistance  Date Application Sent to Patient 12/20/2022  Application Items Requested Application  Date Application Sent to Prescriber 12/20/2022  Name of Prescriber Dorothyann Peng  Anticpated Follow Up Date with Patient/Provider 12/22/2022  Date Application Received From Patient 12/22/2022  Application Items Received From Patient Application  Date Application Received From Provider 12/22/2022  Date Application Submitted to Manufacturer 12/22/2022  Method Application Sent to Manufacturer Fax  Anticipated Follow Up Date With Manufacturer 01/26/2023          12/22/2022  Outreach Medication One  Initial Outreach Date (Medication One) 12/20/2022  Manufacturer Medication One [employer]  Nordisk Drugs Ozempic  Dose of Ozempic 1 mg  Type of Radiographer, therapeutic Assistance  Date Application Sent to Patient 12/20/2022  Application Items Requested Application  Date Application Sent to Prescriber 12/20/2022  Name of Prescriber Dorothyann Peng  Date Application Received From Patient 12/22/2022  Application Items Received From Patient Application  Date Application Received From Provider 12/22/2022  Date Application Submitted to Manufacturer 12/22/2022  Method Application Sent to Manufacturer Fax            12/22/2022  Outreach Medication Two  Initial Outreach Date (Medication Two) 12/20/2022  Manufacturer Medication Two Novo Nordisk  Nordisk Drugs Tresiba  Dose of Tresiba 20 units  Type of Assistance Manufacturer Assistance  Date Application Sent to Patient 12/20/2022  Application Items Requested Application  Date Application Sent  to Prescriber 12/20/2022  Name of Prescriber Dorothyann Peng  Date Application Received From Patient 12/22/2022  Application Items Received From Patient Application  Date Application Received From Provider 12/22/2022  Method Application Sent to Manufacturer Fax  Date Application Submitted to Manufacturer 12/22/2022       Will collaborate with technician team for follow up.   Catie Eppie Gibson, PharmD, BCACP, CPP Clinical Pharmacist [facility] [phone]

## 2022-12-28 ENCOUNTER — Other Ambulatory Visit: Payer: Self-pay | Admitting: Internal Medicine

## 2022-12-28 MED ORDER — DOXYCYCLINE HYCLATE 100 MG PO TABS: 100.0000 mg | ORAL_TABLET | Freq: Two times a day (BID) | ORAL | 0 refills | Status: DC

## 2022-12-31 ENCOUNTER — Other Ambulatory Visit: Payer: Self-pay | Admitting: Internal Medicine

## 2023-01-09 ENCOUNTER — Other Ambulatory Visit: Payer: Self-pay | Admitting: Internal Medicine

## 2023-01-10 ENCOUNTER — Other Ambulatory Visit: Payer: Medicare Other | Admitting: Pharmacist

## 2023-01-10 DIAGNOSIS — E7800 Pure hypercholesterolemia, unspecified: Secondary | ICD-10-CM

## 2023-01-10 DIAGNOSIS — Z794 Long term (current) use of insulin: Secondary | ICD-10-CM

## 2023-01-10 DIAGNOSIS — E1122 Type 2 diabetes mellitus with diabetic chronic kidney disease: Secondary | ICD-10-CM

## 2023-01-10 DIAGNOSIS — I10 Essential (primary) hypertension: Secondary | ICD-10-CM

## 2023-01-10 MED ORDER — SEMAGLUTIDE (2 MG/DOSE) 8 MG/3ML ~~LOC~~ SOPN: 2.0000 mg | PEN_INJECTOR | SUBCUTANEOUS | Status: AC

## 2023-01-10 NOTE — Progress Notes (Signed)
01/10/2023 Name: Ashley Monroe MRN: [id number] DOB: [birth year]  Chief Complaint  Patient presents with   Medication Management   Diabetes    Ashley Monroe is a 67 y.o. year old female who presented for a telephone visit.   They were referred to the pharmacist by their PCP for assistance in managing diabetes.    Subjective:  Care Team: Primary Care Provider: Dorothyann Peng, MD ; Next Scheduled Visit: 04/06/22  Medication Access/Adherence  Current Pharmacy:  [id number] - [place], [place] - [address] AT [facility] [address]  [place] [address] Phone: [phone] Fax: [phone]   Patient reports affordability concerns with their medications: No  Patient reports access/transportation concerns to their pharmacy: No  Patient reports adherence concerns with their medications:  No     Diabetes:  Current medications: Ozempic 2 mg weekly, Tresiba 20 units daily, metformin 500 mg twice daily  Current glucose readings: fasting 80-120s  Patient denies hypoglycemic s/sx including dizziness, shakiness, sweating. Patient denies hyperglycemic symptoms including polyuria, polydipsia, polyphagia, nocturia, neuropathy, blurred vision.  Current meal patterns:  - Breakfast: ginger and lemon tea, [place] bacon;  - Lunch: [place] sandwich; wheat bread;  - Supper: salad, baked chicken, two vegetables;  - Drinks: sometimes sugar free cranberry juice; lipton tea  Current physical activity: walking 2-3 times weekly; 20 minutes   Current medication access support: [employer] assistance; have submitted assistance application for 2025.   Hypertension:  Current medications: olmesartan/hydrochlorothiazide 40/25 mg daily, amlodipine 10 mg daily  Patient has a validated, automated, upper arm home BP cuff Current blood pressure readings readings: 128/77  Patient denies hypotensive s/sx including dizziness, lightheadedness.    Hyperlipidemia/ASCVD Risk Reduction  Current lipid lowering medications: Repatha 140 mg every 14 days Medications tried in the past: myopathy with statins, CK elevations  Antiplatelet regimen: aspirin 81 mg daily   Objective:  Lab Results  Component Value Date   HGBA1C 6.5 (H) 11/23/2022    Lab Results  Component Value Date   CREATININE 1.02 (H) 11/23/2022   BUN 16 11/23/2022   NA 144 11/23/2022   K 4.8 11/23/2022   CL 103 11/23/2022   CO2 24 11/23/2022    Lab Results  Component Value Date   CHOL 141 11/23/2022   HDL 42 11/23/2022   LDLCALC 66 11/23/2022   TRIG 198 (H) 11/23/2022   CHOLHDL 3.4 11/23/2022    Medications Reviewed Today     Reviewed by Alden Hipp, RPH-CPP (Pharmacist) on 01/10/23 at 1314  Med List Status: <None>   Medication Order Taking? Sig Documenting Provider Last Dose Status Informant  Accu-Chek FastClix Lancets MISC [id number] Yes Use as instructed to check blood sugars 2 times per day dx: E11.22 Dorothyann Peng, MD Taking Active   ACCU-CHEK GUIDE test strip [id number] Yes USE AS INSTRUCTED TO CHECK BLOOD SUGARS 2 TIMES PER DAY Dorothyann Peng, MD Taking Active   allopurinol (ZYLOPRIM) 300 MG tablet [id number] Yes TAKE 1.5 TABLETS BY MOUTH DAILY. Pollyann Savoy, MD Taking Active   amLODipine (NORVASC) 10 MG tablet [id number] Yes Take 1 tablet (10 mg total) by mouth daily. Dorothyann Peng, MD Taking Active   aspirin EC 81 MG tablet [id number] Yes Take 81 mg by mouth daily. [provider] Taking Active Self  cetirizine (ZYRTEC) 10 MG tablet [id number] Yes Take 10 mg by mouth daily. [provider] Taking Active Self  cholecalciferol (VITAMIN D3) 25 MCG (1000 UNIT) tablet [id number] Yes Take 1,000 Units  by mouth daily. [provider] Taking Active   colchicine 0.6 MG tablet [id number] Yes TAKE 1 TABLET BY MOUTH EVERY DAY Gearldine Bienenstock, PA-C Taking Active   cyclobenzaprine (FLEXERIL) 10 MG tablet [id number] Yes Take 10 mg  by mouth at bedtime as needed for muscle spasms. [provider] Taking Active Self  doxycycline (VIBRA-TABS) 100 MG tablet [id number] Yes Take 1 tablet (100 mg total) by mouth 2 (two) times daily. Dorothyann Peng, MD Taking Active   DULoxetine (CYMBALTA) 30 MG capsule [id number] Yes TAKE 1 CAPSULE (30 MG TOTAL) BY MOUTH DAILY. TAKE 1 CAPSULE BY MOUTH EVERY DAY Dorothyann Peng, MD Taking Active   Evolocumab [facility] SURECLICK) 140 MG/ML Ivory Broad [id number] Yes Inject 140 mg into the skin every 14 (fourteen) days. Dorothyann Peng, MD Taking Active   gabapentin (NEURONTIN) 300 MG capsule [id number] Yes TAKE 1 CAPSULE BY MOUTH THREE TIMES A DAY [provider] Taking Active   HYDROcodone-acetaminophen (NORCO) 10-325 MG tablet [id number] Yes TAKE 1 TABLET 3 TIMES A DAY BY ORAL ROUTE AS NEEDED. [provider] Taking Active   hydroxychloroquine (PLAQUENIL) 200 MG tablet [id number] Yes TAKE 1 TABLET BY MOUTH TWICE A DAY Gearldine Bienenstock, PA-C Taking Active   insulin degludec (TRESIBA FLEXTOUCH) 200 UNIT/ML FlexTouch Pen [id number] Yes Inject 20 Units into the skin at bedtime. Dorothyann Peng, MD Taking Active   Insulin Pen Needle (PEN NEEDLES) 32G X 4 MM MISC [id number]  Use as directed with Levemir Dorothyann Peng, MD  Active   metFORMIN (GLUCOPHAGE) 500 MG tablet [id number] Yes TAKE 1 TABLET BY MOUTH TWICE A DAY WITH FOOD Dorothyann Peng, MD Taking Active   olmesartan-hydrochlorothiazide [facility] HCT) 40-25 MG tablet [id number] Yes TAKE 1 TABLET BY MOUTH EVERY DAY Dorothyann Peng, MD Taking Active   Semaglutide, 1 MG/DOSE, 4 MG/3ML SOPN [id number] Yes Inject 1 mg into the skin once a week. Dorothyann Peng, MD Taking Active               Assessment/Plan:   Diabetes: - Currently controlled but opportunity for optimization - Reviewed long term cardiovascular and renal outcomes of uncontrolled blood sugar - Reviewed goal A1c, goal fasting, and goal 2 hour post prandial glucose - Recommend to  reduce Tresiba to 16 units daily. Continue Ozempic 2 mg weekly, metformin 500 mg twice daily. Will plan to continue to reduce Tresiba and increase metformin if needed to maintain glycemic control but remove insulin  Hypertension: - Currently controlled - Reviewed appropriate blood pressure monitoring technique and reviewed goal blood pressure. Recommended to check home blood pressure and heart rate periodically - Recommend to continue current regimen   Hyperlipidemia/ASCVD Risk Reduction: - Currently controlled.  - Recommend to continue Repatha. Enrolled in [facility] for Hyperlipidemia. Will complete PA and provide grant information in January, patient has 8 weeks of therapy remaining.   Follow Up Plan: phone call in 6 weeks  Catie TClearance Coots, PharmD, BCACP, CPP Clinical Pharmacist [facility] [phone]

## 2023-01-10 NOTE — Patient Instructions (Signed)
Ashley Monroe,   It was great talking to you today!  Decrease Tresiba to 16 units daily. Check your blood sugars twice daily:  1) Fasting, first thing in the morning before breakfast and  2) 2 hours after your largest meal.   For a goal A1c of less than 7%, goal fasting readings are less than 130 and goal 2 hour after meal readings are less than 180.    Let me know if your readings increase to be over those goals. Continue Ozempic 2 mg weekly and metformin 500 mg twice daily.   Catie Eppie Gibson, PharmD, BCACP, CPP Clinical Pharmacist [facility] [phone]

## 2023-01-11 ENCOUNTER — Other Ambulatory Visit: Payer: BC Managed Care – PPO | Admitting: Pharmacist

## 2023-01-12 ENCOUNTER — Other Ambulatory Visit: Payer: Self-pay | Admitting: Pharmacist

## 2023-01-12 NOTE — Progress Notes (Signed)
 [employer], clarifications needed on 2025 application. Refaxed application.   Catie Eppie Gibson, PharmD, BCACP, CPP Clinical Pharmacist [facility] [phone]

## 2023-01-14 ENCOUNTER — Telehealth: Payer: Self-pay

## 2023-01-14 NOTE — Telephone Encounter (Signed)
PAP: Patient assistance application for Ozempic, Tresiba, and Pen needles has been approved by PAP Companies: NovoNordisk from 01/01//2025 to 02/08/2024. Medication should be delivered to PAP Delivery: Provider's office For further shipping updates, please contact Novo Nordisk at [phone] Pt ID is: [id number] Rx Prior Auth Team

## 2023-02-04 ENCOUNTER — Encounter: Payer: Self-pay | Admitting: Pharmacist

## 2023-02-21 ENCOUNTER — Other Ambulatory Visit: Payer: Self-pay | Admitting: Pharmacist

## 2023-02-21 ENCOUNTER — Ambulatory Visit: Payer: Medicare Other | Admitting: Physician Assistant

## 2023-02-21 MED ORDER — METFORMIN HCL 500 MG PO TABS: 1000.0000 mg | ORAL_TABLET | Freq: Two times a day (BID) | ORAL | 3 refills | Status: AC

## 2023-02-21 MED ORDER — REPATHA SURECLICK 140 MG/ML ~~LOC~~ SOAJ: 140.0000 mg | SUBCUTANEOUS | 3 refills | Status: DC

## 2023-02-21 MED ORDER — TRESIBA FLEXTOUCH 200 UNIT/ML ~~LOC~~ SOPN: 10.0000 [IU] | PEN_INJECTOR | Freq: Every day | SUBCUTANEOUS | Status: AC

## 2023-02-21 NOTE — Progress Notes (Signed)
 02/21/2023 Name: Ashley Monroe MRN: [id number] DOB: 05-27-1955  Chief Complaint  Patient presents with   Medication Management   Diabetes    Ashley Monroe is a 68 y.o. year old female who presented for a telephone visit.   They were referred to the pharmacist by their PCP for assistance in managing diabetes.    Subjective:  Care Team: Primary Care Provider: Jarold Medici, MD ; Next Scheduled Visit: 04/07/23  Medication Access/Adherence  Current Pharmacy:  [id number] - [place], [place] - [address] AT [facility] [address] [place] [address] Phone: [phone] Fax: [phone]   Patient reports affordability concerns with their medications: No  Patient reports access/transportation concerns to their pharmacy: No  Patient reports adherence concerns with their medications:  No     Diabetes:  Current medications: metformin  500 mg twice daily, Tresiba  16 units daily, Ozempic  2 mg weekly   Current glucose readings: highest has been 122; most readings have been in the 90s; notes she checks readings about 2 hours after supper and fasting;   Patient denies hypoglycemic s/sx including dizziness, shakiness, sweating.   Hypertension:  Current medications: olmesartan /hydrochlorothiazide  40/25 mg daily  Patient has a validated, automated, upper arm home BP cuff Current blood pressure readings readings: 122/77; reports readings are generally in <130/80  Patient denies hypotensive s/sx including dizziness, lightheadedness.   Hyperlipidemia/ASCVD Risk Reduction  Current lipid lowering medications: Repatha  140 mg every 14 days Medications tried in the past: myopathy with statins, CK elevations   Objective:  Lab Results  Component Value Date   HGBA1C 6.5 (H) 11/23/2022    Lab Results  Component Value Date   CREATININE 1.02 (H) 11/23/2022   BUN 16 11/23/2022   NA 144 11/23/2022   K 4.8 11/23/2022   CL  103 11/23/2022   CO2 24 11/23/2022    Lab Results  Component Value Date   CHOL 141 11/23/2022   HDL 42 11/23/2022   LDLCALC 66 11/23/2022   TRIG 198 (H) 11/23/2022   CHOLHDL 3.4 11/23/2022    Medications Reviewed Today     Reviewed by Rudy Dorothyann DASEN, RPH-CPP (Pharmacist) on 02/21/23 at 1404  Med List Status: <None>   Medication Order Taking? Sig Documenting Provider Last Dose Status Informant  Accu-Chek FastClix Lancets MISC [id number] Yes Use as instructed to check blood sugars 2 times per day dx: E11.22 Jarold Medici, MD Taking Active   ACCU-CHEK GUIDE test strip [id number] Yes USE AS INSTRUCTED TO CHECK BLOOD SUGARS 2 TIMES PER DAY Jarold Medici, MD Taking Active   allopurinol  (ZYLOPRIM ) 300 MG tablet [id number] Yes TAKE 1.5 TABLETS BY MOUTH DAILY. Dolphus Reiter, MD Taking Active   amLODipine  (NORVASC ) 10 MG tablet [id number] Yes Take 1 tablet (10 mg total) by mouth daily. Jarold Medici, MD Taking Active   aspirin EC 81 MG tablet [id number] Yes Take 81 mg by mouth daily. [provider] Taking Active Self  cetirizine (ZYRTEC) 10 MG tablet [id number] Yes Take 10 mg by mouth daily. [provider] Taking Active Self  cholecalciferol (VITAMIN D3) 25 MCG (1000 UNIT) tablet [id number] Yes Take 1,000 Units by mouth daily. [provider] Taking Active   colchicine  0.6 MG tablet [id number] Yes TAKE 1 TABLET BY MOUTH EVERY DAY Cheryl Waddell CHRISTELLA, PA-C Taking Active   cyclobenzaprine  (FLEXERIL ) 10 MG tablet [id number] Yes Take 10 mg by mouth at bedtime as needed for muscle spasms. [provider] Taking Active Self  DULoxetine  (  CYMBALTA ) 30 MG capsule [id number] Yes TAKE 1 CAPSULE (30 MG TOTAL) BY MOUTH DAILY. TAKE 1 CAPSULE BY MOUTH EVERY DAY Jarold Medici, MD Taking Active   Evolocumab  (REPATHA  SURECLICK) 140 MG/ML SOAJ [id number] Yes Inject 140 mg into the skin every 14 (fourteen) days. Jarold Medici, MD Taking Active   gabapentin  (NEURONTIN ) 300 MG capsule  [id number] Yes TAKE 1 CAPSULE BY MOUTH THREE TIMES A DAY [provider] Taking Active   HYDROcodone -acetaminophen  (NORCO) 10-325 MG tablet [id number] Yes TAKE 1 TABLET 3 TIMES A DAY BY ORAL ROUTE AS NEEDED. [provider] Taking Active   hydroxychloroquine  (PLAQUENIL ) 200 MG tablet [id number] Yes TAKE 1 TABLET BY MOUTH TWICE A DAY Cheryl Waddell HERO, PA-C Taking Active   insulin  degludec (TRESIBA  FLEXTOUCH) 200 UNIT/ML FlexTouch Pen [id number] Yes Inject 20 Units into the skin at bedtime. Jarold Medici, MD Taking Active   Insulin  Pen Needle (PEN NEEDLES) 32G X 4 MM MISC [id number] Yes Use as directed with Levemir  Jarold Medici, MD Taking Active   metFORMIN  (GLUCOPHAGE ) 500 MG tablet [id number] Yes TAKE 1 TABLET BY MOUTH TWICE A DAY WITH FOOD Jarold Medici, MD Taking Active   olmesartan -hydrochlorothiazide  (BENICAR  HCT) 40-25 MG tablet [id number] Yes TAKE 1 TABLET BY MOUTH EVERY DAY Jarold Medici, MD Taking Active   Semaglutide , 2 MG/DOSE, 8 MG/3ML SOPN [id number] Yes Inject 2 mg as directed once a week. Jarold Medici, MD Taking Active               Assessment/Plan:   Diabetes: - Currently controlled, opportunity for optimization - Recommend to increase metformin  to 1000 mg twice daily, reduce Tresiba  to 10 units daily to reduce risk of hypoglycemia. Continue Ozempic  2 mg weekly. Goal for patient to be able to eliminate insulin  at next appointment.  - Recommend to check glucose twice daily, fasting and 2 hour post prandial  Hypertension: - Currently controlled - Recommended to check home blood pressure and heart rate periodically - Recommend to continue current regimen.   Hyperlipidemia/ASCVD Risk Reduction: - Currently controlled, but would check lipid panel after longer time on therapy.  - Recommend to continue current regimen at this time. Repatha  [employer] information sent to pharmacy.    Follow Up Plan: follow up with PCP as scheduled  Catie IVAR Centers,  PharmD, BCACP, CPP Clinical Pharmacist [facility] [phone]

## 2023-02-21 NOTE — Patient Instructions (Addendum)
 Ms Poppell,   It was great talking to you today!  For your Repatha , the pharmacy will:  1) [employer] as primary 2) [employer] as secondary insurance  They have this information in their system but I have also provided it below:  [employer]; BIN: [id number]; PCN: [id number]; Group: [id number]; ID: [id number]    Please reach out with any questions!  Catie IVAR Centers, PharmD, BCACP, CPP Clinical Pharmacist [facility] [phone]

## 2023-02-23 ENCOUNTER — Other Ambulatory Visit: Payer: Self-pay

## 2023-04-07 ENCOUNTER — Ambulatory Visit: Payer: Medicare Other | Admitting: Internal Medicine

## 2023-04-07 ENCOUNTER — Encounter: Payer: Self-pay | Admitting: Internal Medicine

## 2023-04-07 VITALS — BP 110/78 | HR 82 | Temp 97.9°F | Ht 65.0 in | Wt 213.6 lb

## 2023-04-07 DIAGNOSIS — N182 Chronic kidney disease, stage 2 (mild): Secondary | ICD-10-CM | POA: Diagnosis not present

## 2023-04-07 DIAGNOSIS — E1122 Type 2 diabetes mellitus with diabetic chronic kidney disease: Secondary | ICD-10-CM

## 2023-04-07 DIAGNOSIS — Z794 Long term (current) use of insulin: Secondary | ICD-10-CM

## 2023-04-07 DIAGNOSIS — E7800 Pure hypercholesterolemia, unspecified: Secondary | ICD-10-CM

## 2023-04-07 DIAGNOSIS — E66812 Obesity, class 2: Secondary | ICD-10-CM

## 2023-04-07 DIAGNOSIS — I129 Hypertensive chronic kidney disease with stage 1 through stage 4 chronic kidney disease, or unspecified chronic kidney disease: Secondary | ICD-10-CM

## 2023-04-07 DIAGNOSIS — M0579 Rheumatoid arthritis with rheumatoid factor of multiple sites without organ or systems involvement: Secondary | ICD-10-CM

## 2023-04-07 DIAGNOSIS — Z6835 Body mass index (BMI) 35.0-35.9, adult: Secondary | ICD-10-CM

## 2023-04-07 DIAGNOSIS — E2839 Other primary ovarian failure: Secondary | ICD-10-CM

## 2023-04-07 LAB — CMP14+EGFR
ALT: 17 [IU]/L (ref 0–32)
AST: 18 [IU]/L (ref 0–40)
Albumin: 4.3 g/dL (ref 3.9–4.9)
Alkaline Phosphatase: 126 [IU]/L — ABNORMAL HIGH (ref 44–121)
BUN/Creatinine Ratio: 16 (ref 12–28)
BUN: 15 mg/dL (ref 8–27)
Bilirubin Total: 0.4 mg/dL (ref 0.0–1.2)
CO2: 23 mmol/L (ref 20–29)
Calcium: 9.9 mg/dL (ref 8.7–10.3)
Chloride: 104 mmol/L (ref 96–106)
Creatinine, Ser: 0.96 mg/dL (ref 0.57–1.00)
Globulin, Total: 2.3 g/dL (ref 1.5–4.5)
Glucose: 107 mg/dL — ABNORMAL HIGH (ref 70–99)
Potassium: 4.7 mmol/L (ref 3.5–5.2)
Sodium: 144 mmol/L (ref 134–144)
Total Protein: 6.6 g/dL (ref 6.0–8.5)
eGFR: 65 mL/min/{1.73_m2} (ref >59)

## 2023-04-07 LAB — HEMOGLOBIN A1C
Est. average glucose Bld gHb Est-mCnc: 143 mg/dL
Hgb A1c MFr Bld: 6.6 % — ABNORMAL HIGH (ref 4.8–5.6)

## 2023-04-07 NOTE — Assessment & Plan Note (Signed)
 Chronic, she is encouraged to keep BP well controlled and to stay well hydrated to decrease risk of CKD progression.

## 2023-04-07 NOTE — Assessment & Plan Note (Signed)
 I will refer her for bone density. She is encouraged to engage in weight-bearing exercises 3-5x/week and comply with calcium/vit D supplementation.

## 2023-04-07 NOTE — Assessment & Plan Note (Signed)
 Chronic, followed by Rheumatology.  She is encouraged to follow an anti-inflammatory idet. Most recent Rheum notes reviewed, she is on DMARD therapy.

## 2023-04-07 NOTE — Assessment & Plan Note (Signed)
 Chronic, well controlled. Goal BP<120/80. Encouraged to follow low sodium diet. She will continue with olmesartan/hct 40/25mg  daily and amlodipine 10mg  daily.

## 2023-04-07 NOTE — Assessment & Plan Note (Signed)
 She has lost 2lbs since her last visit. She is encouraged to keep up the great work.  She is encouraged to strive to lose ten percent of her body weight to decrease cardiac risk.

## 2023-04-07 NOTE — Assessment & Plan Note (Signed)
 Chronic, she is currently on Tresiba 10 units nightly, Ozempic 2mg  weekly  and metformin 500mg  twice daily.  Importance of dietary compliance was discussed with the patient. She is encouraged to incorporate strength training into her weekly workout routine.

## 2023-04-07 NOTE — Patient Instructions (Signed)

## 2023-04-07 NOTE — Progress Notes (Signed)
 I,[name], CMA,acting as a Neurosurgeon for [name], MD.,have documented all relevant documentation on the behalf of [name], MD,as directed by  [name], MD while in the presence of [name], MD.  Subjective:  Patient ID: Ashley Monroe , female    DOB: 06-25-1955 , 68 y.o.   MRN: [id number]  Chief Complaint  Patient presents with   Diabetes   Hypertension    HPI  Patient is here for diabetes and blood pressure f/u. She reports compliance with meds. She denies having any headaches, chest pain and shortness of breath.    She states after Catie increased her dose of Metformin her insurance no longer covered for her medication. Therefore, she is only taking metformin one tablet twice daily.    Diabetes She presents for her follow-up diabetic visit. She has type 2 diabetes mellitus. Her disease course has been stable. Pertinent negatives for diabetes include no blurred vision and no chest pain. There are no hypoglycemic complications. Risk factors for coronary artery disease include diabetes mellitus, dyslipidemia, hypertension, obesity and sedentary lifestyle. She is following a generally healthy diet. She participates in exercise intermittently. Her breakfast blood glucose is taken between 7-8 am. Her breakfast blood glucose range is generally 130-140 mg/dl. An ACE inhibitor/angiotensin II receptor blocker is being taken. Eye exam is current.  Hypertension This is a chronic problem. The current episode started more than 1 year ago. The problem has been gradually improving since onset. The problem is controlled. Pertinent negatives include no blurred vision, chest pain, palpitations or shortness of breath.     Past Medical History:  Diagnosis Date   Allergy    Arthritis    RA   Carpal tunnel syndrome    Chronic kidney disease    Diabetes mellitus without complication (HCC)    Essential hypertension 06/01/2019   Hyperlipidemia 08/19/2016    Hypertension    PONV (postoperative nausea and vomiting)      Family History  Problem Relation Age of Onset   Atrial fibrillation Mother    Stroke Mother    Heart attack Mother    Aneurysm Mother    Atrial fibrillation Brother    Stroke Brother    Diabetes Brother    Diabetes Father    Alzheimer's disease Father    Diabetes Brother    Alzheimer's disease Maternal Grandmother    Heart attack Maternal Grandfather    Cancer Paternal Grandmother    Healthy Son    Healthy Daughter      Current Outpatient Medications:    Accu-Chek FastClix Lancets MISC, Use as instructed to check blood sugars 2 times per day dx: E11.22, Disp: 300 each, Rfl: 2   ACCU-CHEK GUIDE test strip, USE AS INSTRUCTED TO CHECK BLOOD SUGARS 2 TIMES PER DAY, Disp: 200 strip, Rfl: 4   allopurinol (ZYLOPRIM) 300 MG tablet, TAKE 1.5 TABLETS BY MOUTH DAILY., Disp: 135 tablet, Rfl: 0   amLODipine (NORVASC) 10 MG tablet, Take 1 tablet (10 mg total) by mouth daily., Disp: 90 tablet, Rfl: 2   aspirin EC 81 MG tablet, Take 81 mg by mouth daily., Disp: , Rfl:    cetirizine (ZYRTEC) 10 MG tablet, Take 10 mg by mouth daily., Disp: , Rfl:    cholecalciferol (VITAMIN D3) 25 MCG (1000 UNIT) tablet, Take 1,000 Units by mouth daily., Disp: , Rfl:    colchicine 0.6 MG tablet, TAKE 1 TABLET BY MOUTH EVERY DAY, Disp: 90 tablet, Rfl: 1  cyclobenzaprine (FLEXERIL) 10 MG tablet, Take 10 mg by mouth at bedtime as needed for muscle spasms., Disp: , Rfl: 0   DULoxetine (CYMBALTA) 30 MG capsule, TAKE 1 CAPSULE (30 MG TOTAL) BY MOUTH DAILY. TAKE 1 CAPSULE BY MOUTH EVERY DAY, Disp: 180 capsule, Rfl: 2   Evolocumab (REPATHA SURECLICK) 140 MG/ML SOAJ, Inject 140 mg into the skin every 14 (fourteen) days., Disp: 6 mL, Rfl: 3   gabapentin (NEURONTIN) 300 MG capsule, TAKE 1 CAPSULE BY MOUTH THREE TIMES A DAY, Disp: , Rfl:    HYDROcodone-acetaminophen (NORCO) 10-325 MG tablet, TAKE 1 TABLET 3 TIMES A DAY BY ORAL ROUTE AS NEEDED., Disp: , Rfl:     hydroxychloroquine (PLAQUENIL) 200 MG tablet, TAKE 1 TABLET BY MOUTH TWICE A DAY, Disp: 180 tablet, Rfl: 0   insulin degludec (TRESIBA FLEXTOUCH) 200 UNIT/ML FlexTouch Pen, Inject 10 Units into the skin at bedtime., Disp: , Rfl:    Insulin Pen Needle (PEN NEEDLES) 32G X 4 MM MISC, Use as directed with Levemir, Disp: 150 each, Rfl: 2   metFORMIN (GLUCOPHAGE) 500 MG tablet, Take 2 tablets (1,000 mg total) by mouth 2 (two) times daily with a meal., Disp: 360 tablet, Rfl: 3   olmesartan-hydrochlorothiazide (BENICAR HCT) 40-25 MG tablet, TAKE 1 TABLET BY MOUTH EVERY DAY, Disp: 90 tablet, Rfl: 1   Semaglutide, 2 MG/DOSE, 8 MG/3ML SOPN, Inject 2 mg as directed once a week., Disp: , Rfl:    Allergies  Allergen Reactions   Amoxicillin Hives   Codeine Hives     Review of Systems  Constitutional: Negative.   Eyes:  Negative for blurred vision.  Respiratory: Negative.  Negative for shortness of breath.   Cardiovascular: Negative.  Negative for chest pain and palpitations.  Gastrointestinal: Negative.   Neurological: Negative.   Psychiatric/Behavioral: Negative.       Today's Vitals   04/07/23 1040  BP: 110/78  Pulse: 82  Temp: 97.9 F (36.6 C)  SpO2: 98%  Weight: 213 lb 9.6 oz (96.9 kg)  Height: 5\' 5"  (1.651 m)   Body mass index is 35.54 kg/m.  Wt Readings from Last 3 Encounters:  04/07/23 213 lb 9.6 oz (96.9 kg)  12/03/22 215 lb (97.5 kg)  11/23/22 215 lb (97.5 kg)     Objective:  Physical Exam Vitals and nursing note reviewed.  Constitutional:      Appearance: Normal appearance.  HENT:     Head: Normocephalic and atraumatic.  Cardiovascular:     Rate and Rhythm: Normal rate and regular rhythm.     Heart sounds: Normal heart sounds.  Pulmonary:     Effort: Pulmonary effort is normal.     Breath sounds: Normal breath sounds.  Musculoskeletal:     Cervical back: Normal range of motion.     Right lower leg: No edema.     Left lower leg: No edema.  Skin:    General: Skin  is warm.  Neurological:     General: No focal deficit present.     Mental Status: She is alert.  Psychiatric:        Mood and Affect: Mood normal.        Behavior: Behavior normal.         Assessment And Plan:  Type 2 diabetes mellitus with chronic kidney disease, with long-term current use of insulin, unspecified CKD stage [facility]) Assessment & Plan: Chronic, she is currently on Tresiba 10 units nightly, Ozempic 2mg  weekly  and metformin 500mg  twice daily.  Importance of  dietary compliance was discussed with the patient. She is encouraged to incorporate strength training into her weekly workout routine.   Orders: -     CMP14+EGFR -     Hemoglobin A1c  Hypertensive nephropathy Assessment & Plan: Chronic, well controlled. Goal BP<120/80. Encouraged to follow low sodium diet. She will continue with olmesartan/hct 40/25mg  daily and amlodipine 10mg  daily.    Chronic kidney disease, stage 2 (mild) Assessment & Plan: Chronic, she is encouraged to keep BP well controlled and to stay well hydrated to decrease risk of CKD progression.     Rheumatoid arthritis involving multiple sites with positive rheumatoid factor (HCC) Assessment & Plan: Chronic, followed by Rheumatology.  She is encouraged to follow an anti-inflammatory idet. Most recent Rheum notes reviewed, she is on DMARD therapy.    Estrogen deficiency Assessment & Plan: I will refer her for bone density. She is encouraged to engage in weight-bearing exercises 3-5x/week and comply with calcium/vit D supplementation.      Class 2 severe obesity due to excess calories with serious comorbidity and body mass index (BMI) of 35.0 to 35.9 in adult [facility]) Assessment & Plan: She has lost 2lbs since her last visit. She is encouraged to keep up the great work.  She is encouraged to strive to lose ten percent of her body weight to decrease cardiac risk.    She is encouraged to strive for BMI less than 30 to decrease cardiac risk. Advised  to aim for at least 150 minutes of exercise per week.    Return if symptoms worsen or fail to improve.  Patient was given opportunity to ask questions. Patient verbalized understanding of the plan and was able to repeat key elements of the plan. All questions were answered to their satisfaction.    I, [name], MD, have reviewed all documentation for this visit. The documentation on 04/07/23 for the exam, diagnosis, procedures, and orders are all accurate and complete.  IF YOU HAVE BEEN REFERRED TO A SPECIALIST, IT MAY TAKE 1-2 WEEKS TO SCHEDULE/PROCESS THE REFERRAL. IF YOU HAVE NOT HEARD FROM US/SPECIALIST IN TWO WEEKS, PLEASE GIVE [place] A CALL AT [phone].   THE PATIENT IS ENCOURAGED TO PRACTICE SOCIAL DISTANCING DUE TO THE COVID-19 PANDEMIC.

## 2023-04-22 LAB — HM DEXA SCAN: HM Dexa Scan: NORMAL

## 2023-04-25 ENCOUNTER — Encounter: Payer: Self-pay | Admitting: Internal Medicine

## 2023-05-26 DIAGNOSIS — R194 Change in bowel habit: Secondary | ICD-10-CM | POA: Diagnosis not present

## 2023-05-26 DIAGNOSIS — I10 Essential (primary) hypertension: Secondary | ICD-10-CM | POA: Diagnosis not present

## 2023-05-26 DIAGNOSIS — E782 Mixed hyperlipidemia: Secondary | ICD-10-CM | POA: Diagnosis not present

## 2023-05-26 DIAGNOSIS — Z1211 Encounter for screening for malignant neoplasm of colon: Secondary | ICD-10-CM | POA: Diagnosis not present

## 2023-05-26 DIAGNOSIS — E119 Type 2 diabetes mellitus without complications: Secondary | ICD-10-CM | POA: Diagnosis not present

## 2023-06-03 ENCOUNTER — Other Ambulatory Visit: Payer: Self-pay | Admitting: Internal Medicine

## 2023-06-23 ENCOUNTER — Telehealth: Payer: Self-pay | Admitting: Pharmacist

## 2023-06-23 ENCOUNTER — Other Ambulatory Visit: Payer: Self-pay

## 2023-06-23 DIAGNOSIS — E1122 Type 2 diabetes mellitus with diabetic chronic kidney disease: Secondary | ICD-10-CM

## 2023-06-23 MED ORDER — ACCU-CHEK GUIDE TEST VI STRP: ORAL_STRIP | 1 refills | Status: AC

## 2023-06-23 NOTE — Progress Notes (Signed)
   06/23/2023  Patient ID: Ashley Monroe, female   DOB: [birth year], 67 y.o.   MRN: [id number]  Received a message from Dr. Librada Reedy CMA, Linnell Richardson, requesting a dose change/refill request form be completed for the patient since she is now on Ozempic  2 mg weekly. Her renewal was completed in December of 2024 for Ozempic  1 mg.    Form was completed and sent to obtain Dr. Elnita Hai' signature.  Form will then be sent to Nettie Barb, CPhT to be sent to [employer] and then scanned into the Patient's chart.   HgA1c 6.6%  Follow up in 2 weeks to check on medication assistance progress  Geronimo Krabbe, PharmD, [facility] Clinical Pharmacist [phone]

## 2023-06-23 NOTE — Telephone Encounter (Signed)
-----   Message from [facility] sent at 06/23/2023 11:02 AM EDT ----- Patient received PA. She currently takes Ozempic  2mg  but the 1mg  has been shipped. Per RS if you could do a dose changed form please, thank you! Ozempic  2 mg is also what is currently in her chart.

## 2023-06-24 ENCOUNTER — Other Ambulatory Visit: Payer: Self-pay | Admitting: Internal Medicine

## 2023-06-27 DIAGNOSIS — Z1211 Encounter for screening for malignant neoplasm of colon: Secondary | ICD-10-CM | POA: Diagnosis not present

## 2023-06-27 DIAGNOSIS — K635 Polyp of colon: Secondary | ICD-10-CM | POA: Diagnosis not present

## 2023-06-27 DIAGNOSIS — K5730 Diverticulosis of large intestine without perforation or abscess without bleeding: Secondary | ICD-10-CM | POA: Diagnosis not present

## 2023-06-27 DIAGNOSIS — K5140 Inflammatory polyps of colon without complications: Secondary | ICD-10-CM | POA: Diagnosis not present

## 2023-06-27 LAB — HM COLONOSCOPY

## 2023-06-30 ENCOUNTER — Telehealth: Payer: Self-pay | Admitting: Internal Medicine

## 2023-06-30 ENCOUNTER — Telehealth: Payer: Self-pay

## 2023-06-30 NOTE — Telephone Encounter (Signed)
 Change  dose/ order form faxed to [employer] for Ozempic  2 mg per request.

## 2023-06-30 NOTE — Telephone Encounter (Signed)
 PICKED UP TRESIBA  U100 5X3ML QTY 3 TRESIBA  U200 3X3ML QTY 2 OZEMPIC  1X3ML 4 PCS  NOVOFINE 32G TIP SIZE 100 QTY 2 06/23/2023

## 2023-07-14 ENCOUNTER — Telehealth: Payer: Self-pay | Admitting: Pharmacist

## 2023-07-14 DIAGNOSIS — E1122 Type 2 diabetes mellitus with diabetic chronic kidney disease: Secondary | ICD-10-CM

## 2023-07-14 NOTE — Progress Notes (Signed)
   07/14/2023  Patient ID: Ashley Monroe, female   DOB: [birth year], 68 y.o.   MRN: [id number]   Called [employer] on the Patient's behalf as a dose change request was sent for Ozempic  2mg  pens.  According to [employer], the Patient's order is currently being processed and a tracking number will be produced within the next few days.  Plan: Follow up to be sure the shipment arrives, patient is notified and come into pick up her Ozempic  2mg  Pens.   Geronimo Krabbe, PharmD, BCACP Clinical Pharmacist [phone]

## 2023-07-20 ENCOUNTER — Ambulatory Visit: Payer: BC Managed Care – PPO

## 2023-07-20 VITALS — BP 126/78 | HR 87 | Temp 98.3°F | Ht 65.0 in | Wt 216.4 lb

## 2023-07-20 DIAGNOSIS — Z0000 Encounter for general adult medical examination without abnormal findings: Secondary | ICD-10-CM

## 2023-07-20 DIAGNOSIS — Z794 Long term (current) use of insulin: Secondary | ICD-10-CM

## 2023-07-20 DIAGNOSIS — E1122 Type 2 diabetes mellitus with diabetic chronic kidney disease: Secondary | ICD-10-CM

## 2023-07-20 NOTE — Patient Instructions (Signed)
 Ashley Monroe , Thank you for taking time out of your busy schedule to complete your Annual Wellness Visit with me. I enjoyed our conversation and look forward to speaking with you again next year. I, as well as your care team,  appreciate your ongoing commitment to your health goals. Please review the following plan we discussed and let me know if I can assist you in the future. Your Game plan/ To Do List    Referrals: If you haven't heard from the office you've been referred to, please reach out to them at the phone provided.  N/a Follow up Visits: Next Medicare AWV with our clinical staff: office will schedule   Have you seen your provider in the last 6 months (3 months if uncontrolled diabetes)? Yes Next Office Visit with your provider: 07/25/2023 at 9:40  Clinician Recommendations:  Aim for 30 minutes of exercise or brisk walking, 6-8 glasses of water, and 5 servings of fruits and vegetables each day.       This is a list of the screening recommended for you and due dates:  Health Maintenance  Topic Date Due   COVID-19 Vaccine (7 - 2024-25 season) 12/21/2022   Yearly kidney health urinalysis for diabetes  07/13/2023   Complete foot exam   07/13/2023   Flu Shot  09/09/2023   Hemoglobin A1C  10/05/2023   Eye exam for diabetics  10/27/2023   Mammogram  12/13/2023   Yearly kidney function blood test for diabetes  04/06/2024   Medicare Annual Wellness Visit  07/19/2024   DTaP/Tdap/Td vaccine (4 - Td or Tdap) 07/12/2032   Colon Cancer Screening  06/26/2033   Pneumonia Vaccine  Completed   DEXA scan (bone density measurement)  Completed   Hepatitis C Screening  Completed   Zoster (Shingles) Vaccine  Completed   HPV Vaccine  Aged Out   Meningitis B Vaccine  Aged Out    Advanced directives: (Declined) Advance directive discussed with you today. Even though you declined this today, please call our office should you change your mind, and we can give you the proper paperwork for you to fill  out. Advance Care Planning is important because it:  [x]  Makes sure you receive the medical care that is consistent with your values, goals, and preferences  [x]  It provides guidance to your family and loved ones and reduces their decisional burden about whether or not they are making the right decisions based on your wishes.  Follow the link provided in your after visit summary or read over the paperwork we have mailed to you to help you started getting your Advance Directives in place. If you need assistance in completing these, please reach out to us  so that we can help you!  See attachments for Preventive Care and Fall Prevention Tips.

## 2023-07-20 NOTE — Progress Notes (Signed)
 Subjective:   Ashley Monroe is a 68 y.o. who presents for a Medicare Wellness preventive visit.  As a reminder, Annual Wellness Visits don't include a physical exam, and some assessments may be limited, especially if this visit is performed virtually. We may recommend an in-person follow-up visit with your provider if needed.  Visit Complete: In person    Persons Participating in Visit: Patient.  AWV Questionnaire: Yes: Patient Medicare AWV questionnaire was completed by the patient on 07/18/2023; I have confirmed that all information answered by patient is correct and no changes since this date.  Cardiac Risk Factors include: advanced age (>75men, >97 women);diabetes mellitus;dyslipidemia;hypertension     Objective:     Today's Vitals   07/20/23 0949  BP: 126/78  Pulse: 87  Temp: 98.3 F (36.8 C)  TempSrc: Oral  SpO2: 96%  Weight: 216 lb 6.4 oz (98.2 kg)  Height: 5' 5 (1.651 m)   Body mass index is 36.01 kg/m.     07/20/2023    9:55 AM 07/30/2022    1:11 PM 07/18/2022   11:57 AM 04/27/2022    2:23 PM 07/30/2019   12:10 PM 07/26/2019   10:50 AM 04/23/2015    5:47 PM  Advanced Directives  Does Patient Have a Medical Advance Directive? No No No No No No No  Would patient like information on creating a medical advance directive? No - Patient declined No - Patient declined Yes (MAU/Ambulatory/Procedural Areas - Information given) Yes (MAU/Ambulatory/Procedural Areas - Information given) No - Patient declined  No - patient declined information    Current Medications (verified) Outpatient Encounter Medications as of 07/20/2023  Medication Sig   Accu-Chek FastClix Lancets MISC Use as instructed to check blood sugars 2 times per day dx: E11.22   allopurinol  (ZYLOPRIM ) 300 MG tablet TAKE 1.5 TABLETS BY MOUTH DAILY.   amLODipine  (NORVASC ) 10 MG tablet Take 1 tablet (10 mg total) by mouth daily.   aspirin EC 81 MG tablet Take 81 mg by mouth daily.   cetirizine (ZYRTEC) 10 MG  tablet Take 10 mg by mouth daily.   cholecalciferol (VITAMIN D3) 25 MCG (1000 UNIT) tablet Take 1,000 Units by mouth daily.   colchicine  0.6 MG tablet TAKE 1 TABLET BY MOUTH EVERY DAY   cyclobenzaprine  (FLEXERIL ) 10 MG tablet Take 10 mg by mouth at bedtime as needed for muscle spasms.   DULoxetine  (CYMBALTA ) 30 MG capsule TAKE 1 CAPSULE (30 MG TOTAL) BY MOUTH DAILY. TAKE 1 CAPSULE BY MOUTH EVERY DAY   Evolocumab  (REPATHA  SURECLICK) 140 MG/ML SOAJ Inject 140 mg into the skin every 14 (fourteen) days.   gabapentin  (NEURONTIN ) 300 MG capsule TAKE 1 CAPSULE BY MOUTH THREE TIMES A DAY   glucose blood (ACCU-CHEK GUIDE TEST) test strip Use as instructed   HYDROcodone -acetaminophen  (NORCO) 10-325 MG tablet TAKE 1 TABLET 3 TIMES A DAY BY ORAL ROUTE AS NEEDED.   hydroxychloroquine  (PLAQUENIL ) 200 MG tablet TAKE 1 TABLET BY MOUTH TWICE A DAY   insulin  degludec (TRESIBA  FLEXTOUCH) 200 UNIT/ML FlexTouch Pen Inject 10 Units into the skin at bedtime.   Insulin  Pen Needle (PEN NEEDLES) 32G X 4 MM MISC Use as directed with Levemir    metFORMIN  (GLUCOPHAGE ) 500 MG tablet Take 2 tablets (1,000 mg total) by mouth 2 (two) times daily with a meal.   olmesartan -hydrochlorothiazide  (BENICAR  HCT) 40-25 MG tablet TAKE 1 TABLET BY MOUTH EVERY DAY   Semaglutide , 2 MG/DOSE, 8 MG/3ML SOPN Inject 2 mg as directed once a week.  No facility-administered encounter medications on file as of 07/20/2023.    Allergies (verified) Amoxicillin and Codeine   History: Past Medical History:  Diagnosis Date   Allergy    Arthritis    RA   Carpal tunnel syndrome    Chronic kidney disease    Diabetes mellitus without complication (HCC)    Essential hypertension 06/01/2019   Hyperlipidemia 08/19/2016   Hypertension    PONV (postoperative nausea and vomiting)    Past Surgical History:  Procedure Laterality Date   ABDOMINAL HYSTERECTOMY  2003   ANTERIOR CERVICAL DECOMP/DISCECTOMY FUSION N/A 07/30/2019   Procedure: Anterior  Cervicql Decompression Fusion - Cervcial four-Cervical five - Cervical five-Cervical six;  Surgeon: Isadora Mar, MD;  Location: [facility] OR;  Service: Neurosurgery;  Laterality: N/A;  3C   CERVICAL SPINE SURGERY  08/2017   CHOLECYSTECTOMY  1982   OVARIAN CYST REMOVAL  1981   SPINE SURGERY  2019   TUBAL LIGATION  1992   Family History  Problem Relation Age of Onset   Atrial fibrillation Mother    Stroke Mother    Heart attack Mother    Aneurysm Mother    Arthritis Mother    Heart disease Mother    Hypertension Mother    Miscarriages / [place] Mother    Atrial fibrillation Brother    Stroke Brother    Diabetes Brother    Heart disease Brother    Hypertension Brother    Diabetes Father    Alzheimer's disease Father    Hypertension Father    Vision loss Father    Diabetes Brother    Hypertension Brother    Alzheimer's disease Maternal Grandmother    Heart attack Maternal Grandfather    Cancer Paternal Grandmother    Healthy Son    Healthy Daughter    Social History   Socioeconomic History   Marital status: Married    Spouse name: Not on file   Number of children: Not on file   Years of education: Not on file   Highest education level: Some college, no degree  Occupational History   Not on file  Tobacco Use   Smoking status: Never    Passive exposure: Never   Smokeless tobacco: Never  Vaping Use   Vaping status: Never Used  Substance and Sexual Activity   Alcohol use: Never   Drug use: Yes    Types: Hydrocodone    Sexual activity: Not on file  Other Topics Concern   Not on file  Social History Narrative   Not on file   Social Drivers of Health   Financial Resource Strain: Low Risk  (07/20/2023)   Overall Financial Resource Strain (CARDIA)    Difficulty of Paying Living Expenses: Not hard at all  Food Insecurity: No Food Insecurity (07/20/2023)   Hunger Vital Sign    Worried About Running Out of Food in the Last Year: Never true    Ran Out of Food in the  Last Year: Never true  Transportation Needs: No Transportation Needs (07/20/2023)   PRAPARE - Administrator, Civil Service (Medical): No    Lack of Transportation (Non-Medical): No  Physical Activity: Insufficiently Active (07/20/2023)   Exercise Vital Sign    Days of Exercise per Week: 2 days    Minutes of Exercise per Session: 60 min  Stress: No Stress Concern Present (07/20/2023)   [employer] of Occupational Health - Occupational Stress Questionnaire    Feeling of Stress : Not at all  Social Connections: Socially Integrated (07/20/2023)   Social Connection and Isolation Panel [NHANES]    Frequency of Communication with Friends and Family: More than three times a week    Frequency of Social Gatherings with Friends and Family: More than three times a week    Attends Religious Services: More than 4 times per year    Active Member of [employer] or Organizations: Yes    Attends Engineer, structural: More than 4 times per year    Marital Status: Married    Tobacco Counseling Counseling given: Not Answered    Clinical Intake:  Pre-visit preparation completed: Yes  Pain : No/denies pain     Nutritional Status: BMI > 30  Obese Nutritional Risks: None Diabetes: Yes CBG done?: No Did pt. bring in CBG monitor from home?: No  Lab Results  Component Value Date   HGBA1C 6.6 (H) 04/07/2023   HGBA1C 6.5 (H) 11/23/2022   HGBA1C 7.9 (H) 04/27/2022     How often do you need to have someone help you when you read instructions, pamphlets, or other written materials from your doctor or pharmacy?: 1 - Never  Interpreter Needed?: No  Information entered by :: NAllen LPN   Activities of Daily Living     07/18/2023   12:28 PM  In your present state of health, do you have any difficulty performing the following activities:  Hearing? 0  Vision? 0  Difficulty concentrating or making decisions? 0  Walking or climbing stairs? 0  Dressing or bathing? 0  Doing  errands, shopping? 0  Preparing Food and eating ? N  Using the Toilet? N  In the past six months, have you accidently leaked urine? N  Do you have problems with loss of bowel control? N  Managing your Medications? N  Managing your Finances? N  Housekeeping or managing your Housekeeping? N    Patient Care Team: Cleave Curling, MD as PCP - General (Internal Medicine) Maudine Sos, MD as PCP - Cardiology (Cardiology) Corie Diamond, MD as Attending Physician (Ophthalmology) Ivery Marking, MD as Consulting Physician (Obstetrics and Gynecology)  I have updated your Care Teams any recent Medical Services you may have received from other providers in the past year.     Assessment:    This is a routine wellness examination for [place].  Hearing/Vision screen Hearing Screening - Comments:: Denies hearing issues Vision Screening - Comments:: Regular eye exams, [facility]   Goals Addressed             This Visit's Progress    Patient Stated       07/20/2023, wants to get off diabetes medication and lose weight       Depression Screen     07/20/2023    9:55 AM 04/07/2023   10:41 AM 07/30/2022   12:55 PM 07/13/2022    3:14 PM 04/27/2022    2:05 PM 10/05/2021    2:48 PM 06/24/2020   10:21 AM  PHQ 2/9 Scores  PHQ - 2 Score 0 0 0 0 0 0 0  PHQ- 9 Score 1 0  0       Fall Risk     07/18/2023   12:28 PM 04/07/2023   10:41 AM 11/23/2022   10:44 AM 07/30/2022   12:53 PM 07/13/2022    3:14 PM  Fall Risk   Falls in the past year? 0 0 0 0 0  Number falls in past yr: 0 0 0 0 0  Injury with Fall?  0 0 0  0  Risk for fall due to : Medication side effect No Fall Risks No Fall Risks  No Fall Risks  Follow up Falls prevention discussed;Falls evaluation completed Falls evaluation completed Falls evaluation completed  Falls evaluation completed    MEDICARE RISK AT HOME:  Medicare Risk at Home Any stairs in or around the home?: (Patient-Rptd) Yes If so, are there any  without handrails?: (Patient-Rptd) No Home free of loose throw rugs in walkways, pet beds, electrical cords, etc?: (Patient-Rptd) Yes Adequate lighting in your home to reduce risk of falls?: (Patient-Rptd) Yes Life alert?: (Patient-Rptd) No Use of a cane, walker or w/c?: (Patient-Rptd) No Grab bars in the bathroom?: (Patient-Rptd) Yes Shower chair or bench in shower?: (Patient-Rptd) Yes Elevated toilet seat or a handicapped toilet?: (Patient-Rptd) Yes  TIMED UP AND GO:  Was the test performed?  Yes  Length of time to ambulate 10 feet: 5 sec Gait steady and fast without use of assistive device  Cognitive Function: 6CIT completed        07/20/2023    9:56 AM 07/13/2022    3:20 PM  6CIT Screen  What Year? 0 points 0 points  What month? 0 points 0 points  What time? 0 points 0 points  Count back from 20 0 points 0 points  Months in reverse 2 points 0 points  Repeat phrase 0 points 4 points  Total Score 2 points 4 points    Immunizations Immunization History  Administered Date(s) Administered   DTaP 05/02/2012   Influenza, High Dose Seasonal PF 10/26/2022   Influenza,inj,Quad PF,6+ Mos 11/10/2018, 10/22/2019, 11/07/2020   Influenza-Unspecified 11/30/2017, 11/10/2018, 12/28/2021   PFIZER(Purple Top)SARS-COV-2 Vaccination 04/14/2019, 05/05/2019, 11/24/2019   PNEUMOCOCCAL CONJUGATE-20 01/18/2022   Pfizer Covid-19 Vaccine Bivalent Booster 68yrs & up 11/27/2020, 12/28/2021, 10/26/2022   Rsv, Bivalent, Protein Subunit Rsvpref,pf Pattricia Bores) 10/26/2022   Tdap 05/01/2012, 07/13/2022   Zoster Recombinant(Shingrix) 12/01/2016, 11/27/2020    Screening Tests Health Maintenance  Topic Date Due   COVID-19 Vaccine (7 - 2024-25 season) 12/21/2022   Diabetic kidney evaluation - Urine ACR  07/13/2023   FOOT EXAM  07/13/2023   INFLUENZA VACCINE  09/09/2023   HEMOGLOBIN A1C  10/05/2023   OPHTHALMOLOGY EXAM  10/27/2023   MAMMOGRAM  12/13/2023   Diabetic kidney evaluation - eGFR measurement   04/06/2024   Medicare Annual Wellness (AWV)  07/19/2024   DTaP/Tdap/Td (4 - Td or Tdap) 07/12/2032   Colonoscopy  06/26/2033   Pneumonia Vaccine 21+ Years old  Completed   DEXA SCAN  Completed   Hepatitis C Screening  Completed   Zoster Vaccines- Shingrix  Completed   HPV VACCINES  Aged Out   Meningococcal B Vaccine  Aged Out    Health Maintenance  Health Maintenance Due  Topic Date Due   COVID-19 Vaccine (7 - 2024-25 season) 12/21/2022   Diabetic kidney evaluation - Urine ACR  07/13/2023   FOOT EXAM  07/13/2023   Health Maintenance Items Addressed: Labs Ordered: UACR  Additional Screening:  Vision Screening: Recommended annual ophthalmology exams for early detection of glaucoma and other disorders of the eye. Would you like a referral to an eye doctor? No    Dental Screening: Recommended annual dental exams for proper oral hygiene  Community Resource Referral / Chronic Care Management: CRR required this visit?  No   CCM required this visit?  No   Plan:    I have personally reviewed and noted the following in the patient's chart:   Medical  and social history Use of alcohol, tobacco or illicit drugs  Current medications and supplements including opioid prescriptions. Patient is not currently taking opioid prescriptions. Functional ability and status Nutritional status Physical activity Advanced directives List of other physicians Hospitalizations, surgeries, and ER visits in previous 12 months Vitals Screenings to include cognitive, depression, and falls Referrals and appointments  In addition, I have reviewed and discussed with patient certain preventive protocols, quality metrics, and best practice recommendations. A written personalized care plan for preventive services as well as general preventive health recommendations were provided to patient.   Areatha Beecham, LPN   10/23/7827   After Visit Summary: (In Person-Printed) AVS printed and given to the  patient  Notes: Nothing significant to report at this time.

## 2023-07-21 LAB — MICROALBUMIN / CREATININE URINE RATIO
Creatinine, Urine: 202.6 mg/dL
Microalb/Creat Ratio: 6 mg/g{creat} (ref 0–29)
Microalbumin, Urine: 11.2 ug/mL

## 2023-07-24 ENCOUNTER — Ambulatory Visit: Payer: Self-pay | Admitting: Internal Medicine

## 2023-07-25 ENCOUNTER — Encounter: Payer: Self-pay | Admitting: Internal Medicine

## 2023-07-25 ENCOUNTER — Ambulatory Visit: Payer: Self-pay | Admitting: Internal Medicine

## 2023-07-25 VITALS — BP 134/82 | HR 79 | Temp 98.4°F | Ht 65.0 in | Wt 219.0 lb

## 2023-07-25 DIAGNOSIS — E1122 Type 2 diabetes mellitus with diabetic chronic kidney disease: Secondary | ICD-10-CM

## 2023-07-25 DIAGNOSIS — E785 Hyperlipidemia, unspecified: Secondary | ICD-10-CM | POA: Diagnosis not present

## 2023-07-25 DIAGNOSIS — Z794 Long term (current) use of insulin: Secondary | ICD-10-CM

## 2023-07-25 DIAGNOSIS — Z820 Family history of epilepsy and other diseases of the nervous system: Secondary | ICD-10-CM

## 2023-07-25 DIAGNOSIS — I129 Hypertensive chronic kidney disease with stage 1 through stage 4 chronic kidney disease, or unspecified chronic kidney disease: Secondary | ICD-10-CM | POA: Diagnosis not present

## 2023-07-25 DIAGNOSIS — G9589 Other specified diseases of spinal cord: Secondary | ICD-10-CM

## 2023-07-25 DIAGNOSIS — Z6836 Body mass index (BMI) 36.0-36.9, adult: Secondary | ICD-10-CM

## 2023-07-25 DIAGNOSIS — N182 Chronic kidney disease, stage 2 (mild): Secondary | ICD-10-CM

## 2023-07-25 DIAGNOSIS — M7661 Achilles tendinitis, right leg: Secondary | ICD-10-CM

## 2023-07-25 DIAGNOSIS — M1A0720 Idiopathic chronic gout, left ankle and foot, without tophus (tophi): Secondary | ICD-10-CM | POA: Diagnosis not present

## 2023-07-25 DIAGNOSIS — Z0000 Encounter for general adult medical examination without abnormal findings: Secondary | ICD-10-CM | POA: Diagnosis not present

## 2023-07-25 DIAGNOSIS — E66812 Obesity, class 2: Secondary | ICD-10-CM

## 2023-07-25 DIAGNOSIS — E7800 Pure hypercholesterolemia, unspecified: Secondary | ICD-10-CM | POA: Diagnosis not present

## 2023-07-25 NOTE — Assessment & Plan Note (Signed)
 She has gained 6lbs since Feb 2025.  She is encouraged to strive to lose ten percent of her body weight to decrease cardiac risk.

## 2023-07-25 NOTE — Assessment & Plan Note (Addendum)
 Chronic, fair control.  Goal BP <130/80.  EKG performed, NSR w/o acute changes.   - Encouraged to follow low sodium diet. - She will continue with olmesartan /hct 40/25mg  daily and amlodipine  10mg  daily.

## 2023-07-25 NOTE — Assessment & Plan Note (Signed)

## 2023-07-25 NOTE — Assessment & Plan Note (Addendum)
 Chronic, diabetic foot exam was performed. Managed with Ozempic , metformin , and Tresiba . Discussed potential Tresiba  reduction with increased exercise. Lipoprotein A test planned for genetic heart disease risk assessment. - Order CBC, liver and kidney function, cholesterol, thyroid, and diabetes panel. - Order lipoprotein A test. - Consider calcium  score test if lipoprotein A is elevated. - Encourage increased exercise to potentially reduce Tresiba  dosage. - Provide educational material on MyChart about heart disease risk.   She is currently on Tresiba  10 units nightly, Ozempic  2mg  weekly  and metformin  500mg  twice daily.  Importance of dietary compliance was discussed with the patient. She is encouraged to incorporate strength training into her weekly workout routine.

## 2023-07-25 NOTE — Progress Notes (Signed)
 I,Ashley Monroe, CMA,acting as a Neurosurgeon for Ashley Dung, MD.,have documented all relevant documentation on the behalf of Ashley Dung, MD,as directed by  Ashley Dung, MD while in the presence of Ashley Dung, MD.  Subjective:    Patient ID: Ashley Monroe , female    DOB: 1955/09/02 , 68 y.o.   MRN: [id number]  Chief Complaint  Patient presents with   Annual Exam    Patient presents today for annual exam. She reports compliance with medications. Denies headache, chest pain & sob. GYN: Dr Lesta Rater.     Diabetes   Hypertension    HPI Discussed the use of AI scribe software for clinical note transcription with the patient, who gave verbal consent to proceed.  History of Present Illness Ashley Monroe is a 68 year old female with diabetes who presents for a physical exam and diabetes check.  She experiences recurrent episodes of pain in the back of her heel, which she suspects might be tendonitis. The pain is tender, sometimes inflamed, and there is noticeable calcification in the area.  She has a history of gout, with previous episodes affecting her ankle and foot. Coffee consumption may trigger her gout symptoms, as she experienced tightness and inflammation in her foot after drinking coffee recently. She is currently taking allopurinol  at a dose of one and a half tablets daily and colchicine  as needed.  Her diabetes management includes taking Ozempic  2 mg, metformin  500 mg two tablets twice a day, and Tresiba  10 units at night. She is also on a regimen of olmesartan /hydrochlorothiazide  40/25 mg, gabapentin  as needed, Cymbalta  30 mg daily, Flexeril  as needed, aspirin daily, and amlodipine  10 mg. No issues with shortness of breath during physical activity and reports no issues with bowel movements, which occur daily without problems.  She mentions a prolapsed bladder. She is concerned about her family history of Alzheimer's disease, with her father and other  relatives having had the condition.  Her physical activity includes walking once or twice a week. She drinks about 60 ounces of water daily. She is involved in the care of her grandchildren, including a 51 year old granddaughter and 53-year-old twins.    Diabetes She presents for her follow-up diabetic visit. She has type 2 diabetes mellitus. Her disease course has been stable. Pertinent negatives for diabetes include no blurred vision, no chest pain, no polydipsia, no polyphagia and no polyuria. There are no hypoglycemic complications. Risk factors for coronary artery disease include diabetes mellitus, dyslipidemia, hypertension, obesity and sedentary lifestyle. She is following a generally healthy diet. She participates in exercise intermittently. Her breakfast blood glucose is taken between 7-8 am. Her breakfast blood glucose range is generally 130-140 mg/dl. An ACE inhibitor/angiotensin II receptor blocker is being taken. Eye exam is current.  Hypertension This is a chronic problem. The current episode started more than 1 year ago. The problem has been gradually improving since onset. The problem is controlled. Pertinent negatives include no blurred vision, chest pain, palpitations or shortness of breath. Risk factors for coronary artery disease include diabetes mellitus, dyslipidemia, obesity and sedentary lifestyle. Past treatments include calcium  channel blockers, angiotensin blockers and diuretics. The current treatment provides moderate improvement. Compliance problems include exercise.  Hypertensive end-organ damage includes kidney disease. Identifiable causes of hypertension include chronic renal disease.     Past Medical History:  Diagnosis Date   Allergy    Arthritis    RA   Carpal tunnel syndrome    Chronic kidney disease  Diabetes mellitus without complication (HCC)    Essential hypertension 06/01/2019   Hyperlipidemia 08/19/2016   Hypertension    PONV (postoperative nausea and  vomiting)      Family History  Problem Relation Age of Onset   Atrial fibrillation Mother    Stroke Mother    Heart attack Mother    Aneurysm Mother    Arthritis Mother    Heart disease Mother    Hypertension Mother    Miscarriages / [place] Mother    Atrial fibrillation Brother    Stroke Brother    Diabetes Brother    Heart disease Brother    Hypertension Brother    Diabetes Father    Alzheimer's disease Father    Hypertension Father    Vision loss Father    Diabetes Brother    Hypertension Brother    Alzheimer's disease Maternal Grandmother    Heart attack Maternal Grandfather    Cancer Paternal Grandmother    Healthy Son    Healthy Daughter      Current Outpatient Medications:    Accu-Chek FastClix Lancets MISC, Use as instructed to check blood sugars 2 times per day dx: E11.22, Disp: 300 each, Rfl: 2   allopurinol  (ZYLOPRIM ) 300 MG tablet, TAKE 1.5 TABLETS BY MOUTH DAILY., Disp: 135 tablet, Rfl: 0   amLODipine  (NORVASC ) 10 MG tablet, Take 1 tablet (10 mg total) by mouth daily., Disp: 90 tablet, Rfl: 2   aspirin EC 81 MG tablet, Take 81 mg by mouth daily., Disp: , Rfl:    cetirizine (ZYRTEC) 10 MG tablet, Take 10 mg by mouth daily., Disp: , Rfl:    cholecalciferol (VITAMIN D3) 25 MCG (1000 UNIT) tablet, Take 1,000 Units by mouth daily., Disp: , Rfl:    colchicine  0.6 MG tablet, TAKE 1 TABLET BY MOUTH EVERY DAY, Disp: 90 tablet, Rfl: 1   cyclobenzaprine  (FLEXERIL ) 10 MG tablet, Take 10 mg by mouth at bedtime as needed for muscle spasms., Disp: , Rfl: 0   DULoxetine  (CYMBALTA ) 30 MG capsule, TAKE 1 CAPSULE (30 MG TOTAL) BY MOUTH DAILY. TAKE 1 CAPSULE BY MOUTH EVERY DAY, Disp: 180 capsule, Rfl: 2   Evolocumab  (REPATHA  SURECLICK) 140 MG/ML SOAJ, Inject 140 mg into the skin every 14 (fourteen) days., Disp: 6 mL, Rfl: 3   gabapentin  (NEURONTIN ) 300 MG capsule, TAKE 1 CAPSULE BY MOUTH THREE TIMES A DAY, Disp: , Rfl:    glucose blood (ACCU-CHEK GUIDE TEST) test strip, Use  as instructed, Disp: 100 strip, Rfl: 1   HYDROcodone -acetaminophen  (NORCO) 10-325 MG tablet, TAKE 1 TABLET 3 TIMES A DAY BY ORAL ROUTE AS NEEDED., Disp: , Rfl:    hydroxychloroquine  (PLAQUENIL ) 200 MG tablet, TAKE 1 TABLET BY MOUTH TWICE A DAY, Disp: 180 tablet, Rfl: 0   insulin  degludec (TRESIBA  FLEXTOUCH) 200 UNIT/ML FlexTouch Pen, Inject 10 Units into the skin at bedtime., Disp: , Rfl:    Insulin  Pen Needle (PEN NEEDLES) 32G X 4 MM MISC, Use as directed with Levemir , Disp: 150 each, Rfl: 2   metFORMIN  (GLUCOPHAGE ) 500 MG tablet, Take 2 tablets (1,000 mg total) by mouth 2 (two) times daily with a meal., Disp: 360 tablet, Rfl: 3   olmesartan -hydrochlorothiazide  (BENICAR  HCT) 40-25 MG tablet, TAKE 1 TABLET BY MOUTH EVERY DAY, Disp: 90 tablet, Rfl: 1   Semaglutide , 2 MG/DOSE, 8 MG/3ML SOPN, Inject 2 mg as directed once a week., Disp: , Rfl:    Allergies  Allergen Reactions   Amoxicillin Hives   Codeine Hives  The patient states she uses post menopausal status for birth control. No LMP recorded. Patient has had a hysterectomy.. Negative for Dysmenorrhea. Negative for: breast discharge, breast lump(s), breast pain and breast self exam. Associated symptoms include abnormal vaginal bleeding. Pertinent negatives include abnormal bleeding (hematology), anxiety, decreased libido, depression, difficulty falling sleep, dyspareunia, history of infertility, nocturia, sexual dysfunction, sleep disturbances, urinary incontinence, urinary urgency, vaginal discharge and vaginal itching. Diet regular.The patient states her exercise level is  intermittent, twice weekly.   . The patient's tobacco use is:  Social History   Tobacco Use  Smoking Status Never   Passive exposure: Never  Smokeless Tobacco Never  . She has been exposed to passive smoke. The patient's alcohol use is:  Social History   Substance and Sexual Activity  Alcohol Use Never      Review of Systems  Constitutional: Negative.   HENT:  Negative.    Eyes: Negative.  Negative for blurred vision.  Respiratory: Negative.  Negative for shortness of breath.   Cardiovascular: Negative.  Negative for chest pain and palpitations.  Gastrointestinal: Negative.   Endocrine: Negative.  Negative for polydipsia, polyphagia and polyuria.  Genitourinary: Negative.   Musculoskeletal: Negative.   Skin: Negative.   Allergic/Immunologic: Negative.   Neurological: Negative.   Hematological: Negative.   Psychiatric/Behavioral: Negative.       Today's Vitals   07/25/23 0941  BP: 134/82  Pulse: 79  Temp: 98.4 F (36.9 C)  SpO2: 98%  Weight: 219 lb (99.3 kg)  Height: 5' 5 (1.651 m)   Body mass index is 36.44 kg/m.  Wt Readings from Last 3 Encounters:  07/25/23 219 lb (99.3 kg)  07/20/23 216 lb 6.4 oz (98.2 kg)  04/07/23 213 lb 9.6 oz (96.9 kg)    The 10-year ASCVD risk score (Arnett DK, et al., 2019) is: 18.5%   Values used to calculate the score:     Age: 74 years     Clincally relevant sex: Female     Is Non-Hispanic African American: Yes     Diabetic: Yes     Tobacco smoker: No     Systolic Blood Pressure: 134 mmHg     Is BP treated: Yes     HDL Cholesterol: 42 mg/dL     Total Cholesterol: 141 mg/dL  Objective:  Physical Exam Vitals and nursing note reviewed.  Constitutional:      General: She is not in acute distress.    Appearance: Normal appearance. She is well-developed. She is obese.  HENT:     Head: Normocephalic and atraumatic.     Right Ear: Hearing, tympanic membrane, ear canal and external ear normal. There is no impacted cerumen.     Left Ear: Hearing, tympanic membrane, ear canal and external ear normal. There is no impacted cerumen.     Mouth/Throat:     Pharynx: No oropharyngeal exudate or posterior oropharyngeal erythema.   Eyes:     General: Lids are normal.     Extraocular Movements: Extraocular movements intact.     Conjunctiva/sclera: Conjunctivae normal.   Neck:     Thyroid: No thyroid  mass.     Vascular: No carotid bruit.   Cardiovascular:     Rate and Rhythm: Normal rate and regular rhythm.     Pulses: Normal pulses.          Dorsalis pedis pulses are 2+ on the right side and 2+ on the left side.     Heart sounds: Normal heart sounds. No  murmur heard. Pulmonary:     Effort: Pulmonary effort is normal.     Breath sounds: Normal breath sounds.  Chest:     Chest wall: No mass.  Breasts:    Tanner Score is 5.     Right: Normal. No mass or tenderness.     Left: Normal. No mass or tenderness.  Abdominal:     General: Bowel sounds are normal. There is no distension.     Palpations: Abdomen is soft.     Tenderness: There is no abdominal tenderness.  Genitourinary:    Rectum: Guaiac result negative.   Musculoskeletal:        General: No swelling. Normal range of motion.     Cervical back: Full passive range of motion without pain, normal range of motion and neck supple.     Right lower leg: No edema.     Left lower leg: No edema.  Feet:     Right foot:     Protective Sensation: 5 sites tested.  5 sites sensed.     Skin integrity: Dry skin present.     Left foot:     Protective Sensation: 5 sites tested.  5 sites sensed.     Skin integrity: Dry skin present.     Comments: Right achilles tenderness to palpation No overlying erythema Lymphadenopathy:     Upper Body:     Right upper body: No supraclavicular, axillary or pectoral adenopathy.     Left upper body: No supraclavicular, axillary or pectoral adenopathy.   Skin:    General: Skin is warm and dry.     Capillary Refill: Capillary refill takes less than 2 seconds.   Neurological:     General: No focal deficit present.     Mental Status: She is alert and oriented to person, place, and time.     Cranial Nerves: No cranial nerve deficit.     Sensory: No sensory deficit.   Psychiatric:        Mood and Affect: Mood normal.        Behavior: Behavior normal.        Thought Content: Thought content normal.         Judgment: Judgment normal.      Assessment And Plan:     Encounter for annual health examination Assessment & Plan: A full exam was performed.  Importance of monthly self breast exams was discussed with the patient.  She is advised to get 30-45 minutes of regular exercise, no less than four to five days per week. Both weight-bearing and aerobic exercises are recommended.  She is advised to follow a healthy diet with at least six fruits/veggies per day, decrease intake of red meat and other saturated fats and to increase fish intake to twice weekly.  Meats/fish should not be fried -- baked, boiled or broiled is preferable. It is also important to cut back on your sugar intake.  Be sure to read labels - try to avoid anything with added sugar, high fructose corn syrup or other sweeteners.  If you must use a sweetener, you can try stevia or monkfruit.  It is also important to avoid artificially sweetened foods/beverages and diet drinks. Lastly, wear SPF 50 sunscreen on exposed skin and when in direct sunlight for an extended period of time.  Be sure to avoid fast food restaurants and aim for at least 60 ounces of water daily.       Type 2 diabetes mellitus with stage 2 chronic kidney  disease, with long-term current use of insulin  [facility]) Assessment & Plan: Chronic, diabetic foot exam was performed. Managed with Ozempic , metformin , and Tresiba . Discussed potential Tresiba  reduction with increased exercise. Lipoprotein A test planned for genetic heart disease risk assessment. - Order CBC, liver and kidney function, cholesterol, thyroid, and diabetes panel. - Order lipoprotein A test. - Consider calcium  score test if lipoprotein A is elevated. - Encourage increased exercise to potentially reduce Tresiba  dosage. - Provide educational material on MyChart about heart disease risk.   She is currently on Tresiba  10 units nightly, Ozempic  2mg  weekly  and metformin  500mg  twice daily.  Importance of  dietary compliance was discussed with the patient. She is encouraged to incorporate strength training into her weekly workout routine.   Orders: -     CBC -     CMP14+EGFR -     Lipid panel -     Hemoglobin A1c -     EKG 12-Lead  Hypertensive nephropathy Assessment & Plan: Chronic, fair control.  Goal BP <130/80.  EKG performed, NSR w/o acute changes.   - Encouraged to follow low sodium diet. - She will continue with olmesartan /hct 40/25mg  daily and amlodipine  10mg  daily.   Orders: -     CMP14+EGFR -     EKG 12-Lead  Pure hypercholesterolemia Assessment & Plan: Chronic, not on statin therapy due to myopathy. She is now on Repatha , CCM pharmacy assistance is appreciated.  LDL goal is less than 70.   Orders: -     CMP14+EGFR -     Lipid panel -     Lipoprotein A (LPA) -     CT CARDIAC SCORING (SELF PAY ONLY); Future -     TSH  Myelomalacia of cervical cord [facility]) Assessment & Plan: Noted on previous cervical spine MRI.  - She has had neurosurgical evaluation - Anterior cervical decomp/discectomy fusion performed June 2021.    Right Achilles tendinitis Assessment & Plan: Recurrent heel pain with tenderness and calcification. Advised Voltaren gel and calf stretches. Referral to podiatrist for further evaluation. - Advise use of Voltaren gel twice daily. - Recommend calf stretching exercises. - Refer to podiatrist for further evaluation and potential use of shoe inserts.  Orders: -     Ambulatory referral to Podiatry  Idiopathic chronic gout of left ankle without tophus Assessment & Plan: Gout attacks in ankle and foot, possibly triggered by coffee. Managed with colchicine  and allopurinol . - Check uric acid levels. - Continue colchicine  as needed and allopurinol  1.5 tablets daily.  Orders: -     Uric acid  Class 2 severe obesity due to excess calories with serious comorbidity and body mass index (BMI) of 36.0 to 36.9 in adult [facility]) Assessment & Plan: She has gained  6lbs since Feb 2025.  She is encouraged to strive to lose ten percent of her body weight to decrease cardiac risk.    Family history of Alzheimer disease Assessment & Plan: Family history discussed. Referral to neurologist if memory changes occur. - Monitor for memory changes and consider referral to neurologist if needed.     Return for 1 year HM, 4 month dm f/u. Aaron Aas Patient was given opportunity to ask questions. Patient verbalized understanding of the plan and was able to repeat key elements of the plan. All questions were answered to their satisfaction.   I, Ashley Dung, MD, have reviewed all documentation for this visit. The documentation on 07/25/23 for the exam, diagnosis, procedures, and orders are all accurate and complete.

## 2023-07-25 NOTE — Assessment & Plan Note (Signed)
 Chronic, she is encouraged to keep BP well controlled and to stay well hydrated to decrease risk of CKD progression.

## 2023-07-25 NOTE — Assessment & Plan Note (Signed)
 Family history discussed. Referral to neurologist if memory changes occur. - Monitor for memory changes and consider referral to neurologist if needed.

## 2023-07-25 NOTE — Assessment & Plan Note (Signed)
 Recurrent heel pain with tenderness and calcification. Advised Voltaren gel and calf stretches. Referral to podiatrist for further evaluation. - Advise use of Voltaren gel twice daily. - Recommend calf stretching exercises. - Refer to podiatrist for further evaluation and potential use of shoe inserts.

## 2023-07-25 NOTE — Patient Instructions (Signed)

## 2023-07-25 NOTE — Assessment & Plan Note (Signed)
Chronic, not on statin therapy due to myopathy. She is now on Repatha, CCM pharmacy assistance is appreciated.  LDL goal is less than 70.

## 2023-07-25 NOTE — Assessment & Plan Note (Addendum)
 Noted on previous cervical spine MRI.  - She has had neurosurgical evaluation - Anterior cervical decomp/discectomy fusion performed June 2021.

## 2023-07-25 NOTE — Assessment & Plan Note (Signed)
 Gout attacks in ankle and foot, possibly triggered by coffee. Managed with colchicine  and allopurinol . - Check uric acid levels. - Continue colchicine  as needed and allopurinol  1.5 tablets daily.

## 2023-07-26 ENCOUNTER — Ambulatory Visit: Payer: Self-pay | Admitting: Internal Medicine

## 2023-07-26 LAB — HEMOGLOBIN A1C
Est. average glucose Bld gHb Est-mCnc: 134 mg/dL
Hgb A1c MFr Bld: 6.3 % — ABNORMAL HIGH (ref 4.8–5.6)

## 2023-07-26 LAB — CMP14+EGFR
ALT: 18 IU/L (ref 0–32)
AST: 22 IU/L (ref 0–40)
Albumin: 4.4 g/dL (ref 3.9–4.9)
Alkaline Phosphatase: 132 IU/L — ABNORMAL HIGH (ref 44–121)
BUN/Creatinine Ratio: 19 (ref 12–28)
BUN: 16 mg/dL (ref 8–27)
Bilirubin Total: 0.4 mg/dL (ref 0.0–1.2)
CO2: 22 mmol/L (ref 20–29)
Calcium: 9.9 mg/dL (ref 8.7–10.3)
Chloride: 104 mmol/L (ref 96–106)
Creatinine, Ser: 0.84 mg/dL (ref 0.57–1.00)
Globulin, Total: 2.3 g/dL (ref 1.5–4.5)
Glucose: 101 mg/dL — ABNORMAL HIGH (ref 70–99)
Potassium: 4.4 mmol/L (ref 3.5–5.2)
Sodium: 142 mmol/L (ref 134–144)
Total Protein: 6.7 g/dL (ref 6.0–8.5)
eGFR: 77 mL/min/{1.73_m2} (ref >59)

## 2023-07-26 LAB — CBC
Hematocrit: 46.8 % — ABNORMAL HIGH (ref 34.0–46.6)
Hemoglobin: 14.4 g/dL (ref 11.1–15.9)
MCH: 26.6 pg (ref 26.6–33.0)
MCHC: 30.8 g/dL — ABNORMAL LOW (ref 31.5–35.7)
MCV: 86 fL (ref 79–97)
Platelets: 280 10*3/uL (ref 150–450)
RBC: 5.42 x10E6/uL — ABNORMAL HIGH (ref 3.77–5.28)
RDW: 13.9 % (ref 11.7–15.4)
WBC: 4.8 10*3/uL (ref 3.4–10.8)

## 2023-07-26 LAB — LIPID PANEL
Chol/HDL Ratio: 3.3 ratio (ref 0.0–4.4)
Cholesterol, Total: 162 mg/dL (ref 100–199)
HDL: 49 mg/dL (ref >39)
LDL Chol Calc (NIH): 88 mg/dL (ref 0–99)
Triglycerides: 143 mg/dL (ref 0–149)
VLDL Cholesterol Cal: 25 mg/dL (ref 5–40)

## 2023-07-26 LAB — TSH: TSH: 1.64 u[IU]/mL (ref 0.450–4.500)

## 2023-07-26 LAB — URIC ACID: Uric Acid: 6.4 mg/dL (ref 3.0–7.2)

## 2023-07-26 LAB — LIPOPROTEIN A (LPA): Lipoprotein (a): 58.4 nmol/L (ref <75.0)

## 2023-07-28 ENCOUNTER — Telehealth: Payer: Self-pay

## 2023-07-28 NOTE — Telephone Encounter (Signed)
 Documentation put in.

## 2023-08-03 ENCOUNTER — Ambulatory Visit (INDEPENDENT_AMBULATORY_CARE_PROVIDER_SITE_OTHER)

## 2023-08-03 ENCOUNTER — Ambulatory Visit: Admitting: Podiatry

## 2023-08-03 DIAGNOSIS — M7661 Achilles tendinitis, right leg: Secondary | ICD-10-CM

## 2023-08-03 NOTE — Progress Notes (Signed)
 Subjective:  Patient ID: Ashley Monroe, female    DOB: March 19, 1955,  MRN: [id number]  Chief Complaint  Patient presents with   Foot Pain    Right foot pain  Pt stated that she has been having some swelling at the back of her heel     68 y.o. female presents with the above complaint.  Patient presents with right Achilles tendinitis and insertional pain.  She states that she has been dealing with this for quite some time and it has been swollen and painful it is doing a little bit better today.  She has in the past received cam boot and immobilization and injections by Dr. Charlena.  She said it flared back up again wanted to evaluate denies seeing anyone prior to seeing me denies any other acute complaints.   Review of Systems: Negative except as noted in the HPI. Denies N/V/F/Ch.  Past Medical History:  Diagnosis Date   Allergy    Arthritis    RA   Carpal tunnel syndrome    Chronic kidney disease    Diabetes mellitus without complication (HCC)    Essential hypertension 06/01/2019   Hyperlipidemia 08/19/2016   Hypertension    PONV (postoperative nausea and vomiting)     Current Outpatient Medications:    GOLYTELY 236 g solution, 240 mL Orally, Disp: , Rfl:    Accu-Chek FastClix Lancets MISC, Use as instructed to check blood sugars 2 times per day dx: E11.22, Disp: 300 each, Rfl: 2   allopurinol  (ZYLOPRIM ) 300 MG tablet, TAKE 1.5 TABLETS BY MOUTH DAILY., Disp: 135 tablet, Rfl: 0   amLODipine  (NORVASC ) 10 MG tablet, Take 1 tablet (10 mg total) by mouth daily., Disp: 90 tablet, Rfl: 2   aspirin EC 81 MG tablet, Take 81 mg by mouth daily., Disp: , Rfl:    cetirizine (ZYRTEC) 10 MG tablet, Take 10 mg by mouth daily., Disp: , Rfl:    cholecalciferol (VITAMIN D3) 25 MCG (1000 UNIT) tablet, Take 1,000 Units by mouth daily., Disp: , Rfl:    colchicine  0.6 MG tablet, TAKE 1 TABLET BY MOUTH EVERY DAY, Disp: 90 tablet, Rfl: 1   cyclobenzaprine  (FLEXERIL ) 10 MG tablet, Take 10 mg by mouth at  bedtime as needed for muscle spasms., Disp: , Rfl: 0   DULoxetine  (CYMBALTA ) 30 MG capsule, TAKE 1 CAPSULE (30 MG TOTAL) BY MOUTH DAILY. TAKE 1 CAPSULE BY MOUTH EVERY DAY, Disp: 180 capsule, Rfl: 2   Evolocumab  (REPATHA  SURECLICK) 140 MG/ML SOAJ, Inject 140 mg into the skin every 14 (fourteen) days., Disp: 6 mL, Rfl: 3   gabapentin  (NEURONTIN ) 300 MG capsule, TAKE 1 CAPSULE BY MOUTH THREE TIMES A DAY, Disp: , Rfl:    glucose blood (ACCU-CHEK GUIDE TEST) test strip, Use as instructed, Disp: 100 strip, Rfl: 1   HYDROcodone -acetaminophen  (NORCO) 10-325 MG tablet, TAKE 1 TABLET 3 TIMES A DAY BY ORAL ROUTE AS NEEDED., Disp: , Rfl:    hydroxychloroquine  (PLAQUENIL ) 200 MG tablet, TAKE 1 TABLET BY MOUTH TWICE A DAY, Disp: 180 tablet, Rfl: 0   insulin  degludec (TRESIBA  FLEXTOUCH) 200 UNIT/ML FlexTouch Pen, Inject 10 Units into the skin at bedtime., Disp: , Rfl:    Insulin  Pen Needle (PEN NEEDLES) 32G X 4 MM MISC, Use as directed with Levemir , Disp: 150 each, Rfl: 2   metFORMIN  (GLUCOPHAGE ) 500 MG tablet, Take 2 tablets (1,000 mg total) by mouth 2 (two) times daily with a meal., Disp: 360 tablet, Rfl: 3   olmesartan -hydrochlorothiazide  (BENICAR  HCT) 40-25 MG  tablet, TAKE 1 TABLET BY MOUTH EVERY DAY, Disp: 90 tablet, Rfl: 1   Semaglutide , 2 MG/DOSE, 8 MG/3ML SOPN, Inject 2 mg as directed once a week., Disp: , Rfl:   Social History   Tobacco Use  Smoking Status Never   Passive exposure: Never  Smokeless Tobacco Never    Allergies  Allergen Reactions   Shellfish Allergy     Other Reaction(s): Unknown   Amoxicillin Hives   Codeine Hives   Objective:  There were no vitals filed for this visit. There is no height or weight on file to calculate BMI. Constitutional Well developed. Well nourished.  Vascular Dorsalis pedis pulses palpable bilaterally. Posterior tibial pulses palpable bilaterally. Capillary refill normal to all digits.  No cyanosis or clubbing noted. Pedal hair growth normal.   Neurologic Normal speech. Oriented to person, place, and time. Epicritic sensation to light touch grossly present bilaterally.  Dermatologic Nails well groomed and normal in appearance. No open wounds. No skin lesions.  Orthopedic: Pain on palpation right Achilles tendon insertion pain with dorsiflexion of the ankle joint no pain with plantarflexion of the ankle joint.  Palpable Haglund deformity noted.  Palpable posterior heel spurring noted.   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  Calcification noted at the insertion of the Achilles.  Within the belly of the Achilles. Assessment:   1. Right Achilles tendinitis    Plan:  Patient was evaluated and treated and all questions answered.  Right Achilles tendon insertional pain - All questions and concerns were discussed with the patient in extensive detail - Given the amount of pain that she is experiencing she will benefit from cam boot immobilization she already has cam boot at home she will place herself in it for next 4 weeks.  If there is no improvement given that patient has failed multiple conservative treatment options in the past patient would benefit from an MRI and possible surgical consideration given the size of the posterior) she may be in agreement with the plan  No follow-ups on file.

## 2023-08-17 ENCOUNTER — Ambulatory Visit (HOSPITAL_COMMUNITY)
Admission: RE | Admit: 2023-08-17 | Discharge: 2023-08-17 | Disposition: A | Discharge status: Home / Self Care | Payer: Self-pay | Source: Ambulatory Visit | Attending: Internal Medicine | Admitting: Internal Medicine

## 2023-08-17 DIAGNOSIS — E7800 Pure hypercholesterolemia, unspecified: Secondary | ICD-10-CM | POA: Insufficient documentation

## 2023-09-07 ENCOUNTER — Ambulatory Visit: Admitting: Podiatry

## 2023-09-07 DIAGNOSIS — M7661 Achilles tendinitis, right leg: Secondary | ICD-10-CM | POA: Diagnosis not present

## 2023-09-07 NOTE — Progress Notes (Signed)
 Subjective:  Patient ID: Ashley Monroe, female    DOB: 02-16-1955,  MRN: [id number]  Chief Complaint  Patient presents with   Foot Pain    Pt stated that things are a little better     68 y.o. female presents with the above complaint.  Patient presents with right Achilles tendinitis and insertional pain.  She states that she has been dealing with this for quite some time and it has been swollen and painful it is doing a little bit better today.  She has in the past received cam boot and immobilization and injections by Dr. Charlena.  She said it flared back up again wanted to evaluate denies seeing anyone prior to seeing me denies any other acute complaints.   Review of Systems: Negative except as noted in the HPI. Denies N/V/F/Ch.  Past Medical History:  Diagnosis Date   Allergy    Arthritis    RA   Carpal tunnel syndrome    Chronic kidney disease    Diabetes mellitus without complication (HCC)    Essential hypertension 06/01/2019   Hyperlipidemia 08/19/2016   Hypertension    PONV (postoperative nausea and vomiting)     Current Outpatient Medications:    Accu-Chek FastClix Lancets MISC, Use as instructed to check blood sugars 2 times per day dx: E11.22, Disp: 300 each, Rfl: 2   allopurinol  (ZYLOPRIM ) 300 MG tablet, TAKE 1.5 TABLETS BY MOUTH DAILY., Disp: 135 tablet, Rfl: 0   amLODipine  (NORVASC ) 10 MG tablet, Take 1 tablet (10 mg total) by mouth daily., Disp: 90 tablet, Rfl: 2   aspirin EC 81 MG tablet, Take 81 mg by mouth daily., Disp: , Rfl:    cetirizine (ZYRTEC) 10 MG tablet, Take 10 mg by mouth daily., Disp: , Rfl:    cholecalciferol (VITAMIN D3) 25 MCG (1000 UNIT) tablet, Take 1,000 Units by mouth daily., Disp: , Rfl:    colchicine  0.6 MG tablet, TAKE 1 TABLET BY MOUTH EVERY DAY, Disp: 90 tablet, Rfl: 1   cyclobenzaprine  (FLEXERIL ) 10 MG tablet, Take 10 mg by mouth at bedtime as needed for muscle spasms., Disp: , Rfl: 0   DULoxetine  (CYMBALTA ) 30 MG capsule, TAKE 1 CAPSULE  (30 MG TOTAL) BY MOUTH DAILY. TAKE 1 CAPSULE BY MOUTH EVERY DAY, Disp: 180 capsule, Rfl: 2   Evolocumab  (REPATHA  SURECLICK) 140 MG/ML SOAJ, Inject 140 mg into the skin every 14 (fourteen) days., Disp: 6 mL, Rfl: 3   gabapentin  (NEURONTIN ) 300 MG capsule, TAKE 1 CAPSULE BY MOUTH THREE TIMES A DAY, Disp: , Rfl:    glucose blood (ACCU-CHEK GUIDE TEST) test strip, Use as instructed, Disp: 100 strip, Rfl: 1   GOLYTELY 236 g solution, 240 mL Orally, Disp: , Rfl:    HYDROcodone -acetaminophen  (NORCO) 10-325 MG tablet, TAKE 1 TABLET 3 TIMES A DAY BY ORAL ROUTE AS NEEDED., Disp: , Rfl:    hydroxychloroquine  (PLAQUENIL ) 200 MG tablet, TAKE 1 TABLET BY MOUTH TWICE A DAY, Disp: 180 tablet, Rfl: 0   insulin  degludec (TRESIBA  FLEXTOUCH) 200 UNIT/ML FlexTouch Pen, Inject 10 Units into the skin at bedtime., Disp: , Rfl:    Insulin  Pen Needle (PEN NEEDLES) 32G X 4 MM MISC, Use as directed with Levemir , Disp: 150 each, Rfl: 2   metFORMIN  (GLUCOPHAGE ) 500 MG tablet, Take 2 tablets (1,000 mg total) by mouth 2 (two) times daily with a meal., Disp: 360 tablet, Rfl: 3   olmesartan -hydrochlorothiazide  (BENICAR  HCT) 40-25 MG tablet, TAKE 1 TABLET BY MOUTH EVERY DAY, Disp: 90 tablet,  Rfl: 1   Semaglutide , 2 MG/DOSE, 8 MG/3ML SOPN, Inject 2 mg as directed once a week., Disp: , Rfl:   Social History   Tobacco Use  Smoking Status Never   Passive exposure: Never  Smokeless Tobacco Never    Allergies  Allergen Reactions   Shellfish Allergy     Other Reaction(s): Unknown   Amoxicillin Hives   Codeine Hives   Objective:  There were no vitals filed for this visit. There is no height or weight on file to calculate BMI. Constitutional Well developed. Well nourished.  Vascular Dorsalis pedis pulses palpable bilaterally. Posterior tibial pulses palpable bilaterally. Capillary refill normal to all digits.  No cyanosis or clubbing noted. Pedal hair growth normal.  Neurologic Normal speech. Oriented to person, place,  and time. Epicritic sensation to light touch grossly present bilaterally.  Dermatologic Nails well groomed and normal in appearance. No open wounds. No skin lesions.  Orthopedic: Pain on palpation right Achilles tendon insertion pain with dorsiflexion of the ankle joint no pain with plantarflexion of the ankle joint.  Palpable Haglund deformity noted.  Palpable posterior heel spurring noted.   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  Calcification noted at the insertion of the Achilles.  Within the belly of the Achilles. Assessment:   1. Right Achilles tendinitis    Plan:  Patient was evaluated and treated and all questions answered.  Right Achilles tendon insertional pain - All questions and concerns were discussed with the patient in extensive detail -Given that she is still experiencing pain in setting of failed cam boot immobilization injections I believe patient would benefit from an MRI evaluation of the tendon MRI was order was placed.  She may need surgery in the future.  Will discuss that after the MRI is done - MRI for right Achilles tendon tear was ordered  No follow-ups on file.

## 2023-09-08 ENCOUNTER — Inpatient Hospital Stay
Admission: RE | Admit: 2023-09-08 | Discharge: 2023-09-08 | Discharge status: Home / Self Care | Source: Ambulatory Visit | Attending: Podiatry

## 2023-09-08 ENCOUNTER — Encounter: Payer: Self-pay | Admitting: Pharmacist

## 2023-09-08 DIAGNOSIS — M7661 Achilles tendinitis, right leg: Secondary | ICD-10-CM | POA: Diagnosis not present

## 2023-09-08 NOTE — Progress Notes (Signed)
   09/08/2023  Patient ID: Ashley Monroe, female   DOB: 08/27/1955, 67 y.o.   MRN: [id number]  Received a request from [employer] for completion of a Refill/Reorder/Change request form for Novofine needles. Patient also gets [place] through [employer].  They have stopped automatic refills on Ozempic  and we are not sure about the needles. Although today's request was only for the needles, I will send the form for both the needles and Ozempic  2mg .   Lab Results  Component Value Date   HGBA1C 6.3 (H) 07/25/2023   HGBA1C 6.6 (H) 04/07/2023   HGBA1C 6.5 (H) 11/23/2022   On Ozempic  2 mg weekly, and Tresiba  10 units with dose titrated daily    Meformin 1000mg  twice daily. On med list but not filled this year.  Stars Measure Check: Omesartan-HCTZ-06/24/23 #90  Not on statin therapy due to myopathy on Repatha . Delayed note to provider to add statin exclusion coding to the upcoming visit in October.   Ashley Monroe, PharmD, BCACP Clinical Pharmacist [phone]

## 2023-09-09 ENCOUNTER — Telehealth: Payer: Self-pay | Admitting: Podiatry

## 2023-09-09 NOTE — Telephone Encounter (Signed)
 Patient is scheduled to come in on 10/12/23, for 4wk f/u. Would you like for me to schedule patient to come in for MRI results around August 22nd?

## 2023-09-24 ENCOUNTER — Other Ambulatory Visit: Payer: Self-pay | Admitting: Internal Medicine

## 2023-09-30 ENCOUNTER — Ambulatory Visit (INDEPENDENT_AMBULATORY_CARE_PROVIDER_SITE_OTHER): Admitting: Podiatry

## 2023-09-30 DIAGNOSIS — M7661 Achilles tendinitis, right leg: Secondary | ICD-10-CM

## 2023-09-30 NOTE — Progress Notes (Signed)
 Subjective:  Patient ID: Ashley Monroe, female    DOB: 08/12/1955,  MRN: [id number]  Chief Complaint  Patient presents with   Foot Pain    Pt stated that she is here to go over her MRI results     68 y.o. female presents with the above complaint.  Patient presents for follow-up right Achilles tendinitis insertional pain she states is doing relatively better today swelling is gone down she is here to go over the MRI results denies any other acute complaints ing anyone prior to seeing me denies any other acute complaints.   Review of Systems: Negative except as noted in the HPI. Denies N/V/F/Ch.  Past Medical History:  Diagnosis Date   Allergy    Arthritis    RA   Carpal tunnel syndrome    Chronic kidney disease    Diabetes mellitus without complication (HCC)    Essential hypertension 06/01/2019   Hyperlipidemia 08/19/2016   Hypertension    PONV (postoperative nausea and vomiting)     Current Outpatient Medications:    Accu-Chek FastClix Lancets MISC, Use as instructed to check blood sugars 2 times per day dx: E11.22, Disp: 300 each, Rfl: 2   allopurinol  (ZYLOPRIM ) 300 MG tablet, TAKE 1.5 TABLETS BY MOUTH DAILY., Disp: 135 tablet, Rfl: 0   amLODipine  (NORVASC ) 10 MG tablet, TAKE 1 TABLET BY MOUTH EVERY DAY, Disp: 90 tablet, Rfl: 2   aspirin EC 81 MG tablet, Take 81 mg by mouth daily., Disp: , Rfl:    cetirizine (ZYRTEC) 10 MG tablet, Take 10 mg by mouth daily., Disp: , Rfl:    cholecalciferol (VITAMIN D3) 25 MCG (1000 UNIT) tablet, Take 1,000 Units by mouth daily., Disp: , Rfl:    colchicine  0.6 MG tablet, TAKE 1 TABLET BY MOUTH EVERY DAY, Disp: 90 tablet, Rfl: 1   cyclobenzaprine  (FLEXERIL ) 10 MG tablet, Take 10 mg by mouth at bedtime as needed for muscle spasms., Disp: , Rfl: 0   DULoxetine  (CYMBALTA ) 30 MG capsule, TAKE 1 CAPSULE (30 MG TOTAL) BY MOUTH DAILY. TAKE 1 CAPSULE BY MOUTH EVERY DAY, Disp: 180 capsule, Rfl: 2   Evolocumab  (REPATHA  SURECLICK) 140 MG/ML SOAJ,  Inject 140 mg into the skin every 14 (fourteen) days., Disp: 6 mL, Rfl: 3   gabapentin  (NEURONTIN ) 300 MG capsule, TAKE 1 CAPSULE BY MOUTH THREE TIMES A DAY, Disp: , Rfl:    glucose blood (ACCU-CHEK GUIDE TEST) test strip, Use as instructed, Disp: 100 strip, Rfl: 1   GOLYTELY 236 g solution, 240 mL Orally, Disp: , Rfl:    HYDROcodone -acetaminophen  (NORCO) 10-325 MG tablet, TAKE 1 TABLET 3 TIMES A DAY BY ORAL ROUTE AS NEEDED., Disp: , Rfl:    hydroxychloroquine  (PLAQUENIL ) 200 MG tablet, TAKE 1 TABLET BY MOUTH TWICE A DAY, Disp: 180 tablet, Rfl: 0   insulin  degludec (TRESIBA  FLEXTOUCH) 200 UNIT/ML FlexTouch Pen, Inject 10 Units into the skin at bedtime., Disp: , Rfl:    Insulin  Pen Needle (PEN NEEDLES) 32G X 4 MM MISC, Use as directed with Levemir , Disp: 150 each, Rfl: 2   metFORMIN  (GLUCOPHAGE ) 500 MG tablet, Take 2 tablets (1,000 mg total) by mouth 2 (two) times daily with a meal., Disp: 360 tablet, Rfl: 3   olmesartan -hydrochlorothiazide  (BENICAR  HCT) 40-25 MG tablet, TAKE 1 TABLET BY MOUTH EVERY DAY, Disp: 90 tablet, Rfl: 1   Semaglutide , 2 MG/DOSE, 8 MG/3ML SOPN, Inject 2 mg as directed once a week., Disp: , Rfl:   Social History   Tobacco Use  Smoking Status Never   Passive exposure: Never  Smokeless Tobacco Never    Allergies  Allergen Reactions   Shellfish Allergy     Other Reaction(s): Unknown   Amoxicillin Hives   Codeine Hives   Objective:  There were no vitals filed for this visit. There is no height or weight on file to calculate BMI. Constitutional Well developed. Well nourished.  Vascular Dorsalis pedis pulses palpable bilaterally. Posterior tibial pulses palpable bilaterally. Capillary refill normal to all digits.  No cyanosis or clubbing noted. Pedal hair growth normal.  Neurologic Normal speech. Oriented to person, place, and time. Epicritic sensation to light touch grossly present bilaterally.  Dermatologic Nails well groomed and normal in appearance. No  open wounds. No skin lesions.  Orthopedic: Pain on palpation right Achilles tendon insertion pain with dorsiflexion of the ankle joint no pain with plantarflexion of the ankle joint.  Palpable Haglund deformity noted.  Palpable posterior heel spurring noted.   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  Calcification noted at the insertion of the Achilles.  Within the belly of the Achilles.  Insertional tendinosis of the Achilles tendon with an intrasubstance partial tear. This has slightly progressed when compared with the prior examination. There is reactive edema in the posterior body of the calcaneus. There is a small retrocalcaneal bursal collection.   Moderate degenerative changes midfoot. No acute fracture. Assessment:   No diagnosis found.  Plan:  Patient was evaluated and treated and all questions answered.  Right Achilles tendon insertional pain - All questions and concerns were discussed with the patient in extensive detail -Given that she is still experiencing pain in setting of failed cam boot immobilization injections I believe patient would benefit from an MRI evaluation of the tendon MRI was order was placed.  She may need surgery in the future.  Will discuss that after the MRI is done - MRI was reviewed with the patient extensively consulted by surgery we will get back to me when she is ready right now she is able to manage her pain.  No follow-ups on file.

## 2023-10-12 ENCOUNTER — Ambulatory Visit: Admitting: Podiatry

## 2023-10-28 ENCOUNTER — Telehealth: Payer: Self-pay

## 2023-10-28 DIAGNOSIS — Z79899 Other long term (current) drug therapy: Secondary | ICD-10-CM | POA: Diagnosis not present

## 2023-10-28 DIAGNOSIS — H2513 Age-related nuclear cataract, bilateral: Secondary | ICD-10-CM | POA: Diagnosis not present

## 2023-10-28 DIAGNOSIS — E119 Type 2 diabetes mellitus without complications: Secondary | ICD-10-CM | POA: Diagnosis not present

## 2023-10-28 DIAGNOSIS — H04123 Dry eye syndrome of bilateral lacrimal glands: Secondary | ICD-10-CM | POA: Diagnosis not present

## 2023-10-28 LAB — HM DIABETES EYE EXAM

## 2023-10-28 NOTE — Telephone Encounter (Signed)
 Called patient to inform their  TRESIBA  AND OZEMPIC  patient assistance  is ready for pick up, patient aware, LVM. Patient made aware they have 1 week to pick up patient assistance.

## 2023-11-28 ENCOUNTER — Encounter: Payer: Self-pay | Admitting: Internal Medicine

## 2023-11-28 ENCOUNTER — Ambulatory Visit: Admitting: Internal Medicine

## 2023-11-28 VITALS — BP 132/88 | HR 88 | Temp 97.9°F | Ht 65.0 in | Wt 216.0 lb

## 2023-11-28 DIAGNOSIS — Z794 Long term (current) use of insulin: Secondary | ICD-10-CM

## 2023-11-28 DIAGNOSIS — E7800 Pure hypercholesterolemia, unspecified: Secondary | ICD-10-CM | POA: Diagnosis not present

## 2023-11-28 DIAGNOSIS — Z6835 Body mass index (BMI) 35.0-35.9, adult: Secondary | ICD-10-CM

## 2023-11-28 DIAGNOSIS — E1122 Type 2 diabetes mellitus with diabetic chronic kidney disease: Secondary | ICD-10-CM

## 2023-11-28 DIAGNOSIS — G720 Drug-induced myopathy: Secondary | ICD-10-CM | POA: Diagnosis not present

## 2023-11-28 DIAGNOSIS — M1A0720 Idiopathic chronic gout, left ankle and foot, without tophus (tophi): Secondary | ICD-10-CM

## 2023-11-28 DIAGNOSIS — N182 Chronic kidney disease, stage 2 (mild): Secondary | ICD-10-CM

## 2023-11-28 DIAGNOSIS — E66812 Obesity, class 2: Secondary | ICD-10-CM

## 2023-11-28 DIAGNOSIS — T466X5A Adverse effect of antihyperlipidemic and antiarteriosclerotic drugs, initial encounter: Secondary | ICD-10-CM

## 2023-11-28 DIAGNOSIS — I129 Hypertensive chronic kidney disease with stage 1 through stage 4 chronic kidney disease, or unspecified chronic kidney disease: Secondary | ICD-10-CM | POA: Diagnosis not present

## 2023-11-28 NOTE — Assessment & Plan Note (Signed)
 Chronic, fair control. Goal BP <130/80.  Blood pressure slightly elevated at 132/88 mmHg. On amlodipine  and olmesartan . Advised to minimize salt intake. - Continue amlodipine  10 mg daily. - Continue olmesartan  40/25 mg daily. - Advise to minimize salt intake, including pink Himalayan salt. - Encourage use of alternative seasonings like onion powder and seasoned pepper.

## 2023-11-28 NOTE — Assessment & Plan Note (Signed)
 Chronic, currently stable.  On allopurinol  for management, uses colchicine  for flares. - Continue allopurinol  as prescribed. - Use colchicine  as needed for gout flares. - Avoid known triggers

## 2023-11-28 NOTE — Assessment & Plan Note (Signed)
 Chronic, LDL goal < 70. LDL cholesterol 88 mg/dL, above target. On Repatha , muscle pain with statins. Zetia  considered if LDL remains high. Fiber intake encouraged. - Recheck cholesterol levels at the beginning of the year. - Consider starting Zetia  if LDL cholesterol remains above target. - Encourage increased fiber intake.

## 2023-11-28 NOTE — Assessment & Plan Note (Signed)
 Chronic, blood glucose levels with Tresiba  and metformin . Exercise beneficial for glucose control. - Continue Tresiba  10 units daily and Ozempic  2mg  weekly - Continue metformin  2 tablets twice a day. - Encourage regular exercise. - Start fiber supplement like Benefiber or Metamucil

## 2023-11-28 NOTE — Assessment & Plan Note (Signed)
 Chronic, currently on Repatha .  - Iintolerant of more than 2-3 statins.

## 2023-11-28 NOTE — Patient Instructions (Signed)

## 2023-11-28 NOTE — Progress Notes (Signed)
 I,Victoria T Emmitt, CMA,acting as a Neurosurgeon for Catheryn LOISE Slocumb, MD.,have documented all relevant documentation on the behalf of Catheryn LOISE Slocumb, MD,as directed by  Catheryn LOISE Slocumb, MD while in the presence of Catheryn LOISE Slocumb, MD.  Subjective:  Patient ID: Ashley Monroe , female    DOB: [birth year] , 68 y.o.   MRN: [id number]  Chief Complaint  Patient presents with   Diabetes    Patient is here for diabetes and blood pressure f/u. She reports compliance with meds. She denies having any headaches, chest pain and shortness of breath.   She has mammogram scheduled for Nov.    Hypertension   Hyperlipidemia    HPI Discussed the use of AI scribe software for clinical note transcription with the patient, who gave verbal consent to proceed.  History of Present Illness Ashley Monroe is a 68 year old female with diabetes and hypertension who presents for management of her chronic conditions.  She monitors her blood glucose levels at home, with morning readings typically ranging from 97 to 123 mg/dL. She engages in physical activity two to three times a week and has noticed changes in her clothing fit since starting this routine. Her current diabetes medications include Tresiba  10 units, metformin  2 tablets twice a day, and Ozempic .  She is currently taking amlodipine  10 mg and olmesartan  40/25 mg for hypertension. She is actively trying to reduce her salt intake by using pink Himalayan salt and other seasonings like onion powder and seasoned pepper.  She has experienced muscle pain with statins in the past and is currently using Repatha . She has used fiber supplements in the past without issues.  Her current medications also include allopurinol  for gout, with colchicine  used as needed. She has stopped taking Cymbalta , gabapentin , hydrocodone , and Plaquenil . She is also taking aspirin, Zyrtec as needed, and Deflexarel as needed.  She had a cardiac calcium  score in July, which was zero. Her  bone density was checked in March, and she is up to date with her mammogram scheduled for November.   Diabetes She presents for her follow-up diabetic visit. She has type 2 diabetes mellitus. Her disease course has been stable. Pertinent negatives for diabetes include no blurred vision and no chest pain. There are no hypoglycemic complications. Risk factors for coronary artery disease include diabetes mellitus, dyslipidemia, hypertension, obesity and sedentary lifestyle. She is following a generally healthy diet. She participates in exercise intermittently. Her breakfast blood glucose is taken between 7-8 am. Her breakfast blood glucose range is generally 130-140 mg/dl. An ACE inhibitor/angiotensin II receptor blocker is being taken. Eye exam is current.  Hypertension This is a chronic problem. The current episode started more than 1 year ago. The problem has been gradually improving since onset. The problem is controlled. Pertinent negatives include no blurred vision, chest pain, palpitations or shortness of breath.     Past Medical History:  Diagnosis Date   Allergy    Arthritis    RA   Carpal tunnel syndrome    Chronic kidney disease    Diabetes mellitus without complication (HCC)    Essential hypertension 06/01/2019   Hyperlipidemia 08/19/2016   Hypertension    PONV (postoperative nausea and vomiting)      Family History  Problem Relation Age of Onset   Atrial fibrillation Mother    Stroke Mother    Heart attack Mother    Aneurysm Mother    Arthritis Mother    Heart disease Mother  Hypertension Mother    Miscarriages / [place] Mother    Atrial fibrillation Brother    Stroke Brother    Diabetes Brother    Heart disease Brother    Hypertension Brother    Diabetes Father    Alzheimer's disease Father    Hypertension Father    Vision loss Father    Diabetes Brother    Hypertension Brother    Alzheimer's disease Maternal Grandmother    Heart attack Maternal Grandfather     Cancer Paternal Grandmother    Healthy Son    Healthy Daughter      Current Outpatient Medications:    Accu-Chek FastClix Lancets MISC, Use as instructed to check blood sugars 2 times per day dx: E11.22, Disp: 300 each, Rfl: 2   allopurinol  (ZYLOPRIM ) 300 MG tablet, TAKE 1.5 TABLETS BY MOUTH DAILY., Disp: 135 tablet, Rfl: 0   amLODipine  (NORVASC ) 10 MG tablet, TAKE 1 TABLET BY MOUTH EVERY DAY, Disp: 90 tablet, Rfl: 2   aspirin EC 81 MG tablet, Take 81 mg by mouth daily., Disp: , Rfl:    cetirizine (ZYRTEC) 10 MG tablet, Take 10 mg by mouth daily., Disp: , Rfl:    cholecalciferol (VITAMIN D3) 25 MCG (1000 UNIT) tablet, Take 1,000 Units by mouth daily., Disp: , Rfl:    colchicine  0.6 MG tablet, TAKE 1 TABLET BY MOUTH EVERY DAY, Disp: 90 tablet, Rfl: 1   cyclobenzaprine  (FLEXERIL ) 10 MG tablet, Take 10 mg by mouth at bedtime as needed for muscle spasms., Disp: , Rfl: 0   Evolocumab  (REPATHA  SURECLICK) 140 MG/ML SOAJ, Inject 140 mg into the skin every 14 (fourteen) days., Disp: 6 mL, Rfl: 3   glucose blood (ACCU-CHEK GUIDE TEST) test strip, Use as instructed, Disp: 100 strip, Rfl: 1   GOLYTELY 236 g solution, 240 mL Orally, Disp: , Rfl:    insulin  degludec (TRESIBA  FLEXTOUCH) 200 UNIT/ML FlexTouch Pen, Inject 10 Units into the skin at bedtime., Disp: , Rfl:    Insulin  Pen Needle (PEN NEEDLES) 32G X 4 MM MISC, Use as directed with Levemir , Disp: 150 each, Rfl: 2   metFORMIN  (GLUCOPHAGE ) 500 MG tablet, Take 2 tablets (1,000 mg total) by mouth 2 (two) times daily with a meal., Disp: 360 tablet, Rfl: 3   olmesartan -hydrochlorothiazide  (BENICAR  HCT) 40-25 MG tablet, TAKE 1 TABLET BY MOUTH EVERY DAY, Disp: 90 tablet, Rfl: 1   Semaglutide , 2 MG/DOSE, 8 MG/3ML SOPN, Inject 2 mg as directed once a week., Disp: , Rfl:    Allergies  Allergen Reactions   Shellfish Allergy     Other Reaction(s): Unknown   Amoxicillin Hives   Codeine Hives     Review of Systems  Constitutional: Negative.   HENT:  Negative.    Eyes:  Negative for blurred vision.  Respiratory: Negative.  Negative for shortness of breath.   Cardiovascular: Negative.  Negative for chest pain and palpitations.  Gastrointestinal: Negative.   Neurological: Negative.   Psychiatric/Behavioral: Negative.       Today's Vitals   11/28/23 1121 11/28/23 1136  BP: (!) 148/90 132/88  Pulse: 88   Temp: 97.9 F (36.6 C)   SpO2: 98%   Weight: 216 lb (98 kg)   Height: 5' 5 (1.651 m)    Body mass index is 35.94 kg/m.  Wt Readings from Last 3 Encounters:  11/28/23 216 lb (98 kg)  07/25/23 219 lb (99.3 kg)  07/20/23 216 lb 6.4 oz (98.2 kg)     Objective:  Physical Exam Vitals  and nursing note reviewed.  Constitutional:      Appearance: Normal appearance.  HENT:     Head: Normocephalic and atraumatic.  Eyes:     Extraocular Movements: Extraocular movements intact.  Cardiovascular:     Rate and Rhythm: Normal rate and regular rhythm.     Heart sounds: Normal heart sounds.  Pulmonary:     Effort: Pulmonary effort is normal.     Breath sounds: Normal breath sounds.  Skin:    General: Skin is warm.  Neurological:     General: No focal deficit present.     Mental Status: She is alert.  Psychiatric:        Mood and Affect: Mood normal.        Behavior: Behavior normal.      Assessment And Plan:  Type 2 diabetes mellitus with stage 2 chronic kidney disease, with long-term current use of insulin  (HCC) Assessment & Plan: Chronic, blood glucose levels with Tresiba  and metformin . Exercise beneficial for glucose control. - Continue Tresiba  10 units daily and Ozempic  2mg  weekly - Continue metformin  2 tablets twice a day. - Encourage regular exercise. - Start fiber supplement like Benefiber or Metamucil  Orders: -     CMP14+EGFR -     Hemoglobin A1c  Hypertensive nephropathy Assessment & Plan: Chronic, fair control. Goal BP <130/80.  Blood pressure slightly elevated at 132/88 mmHg. On amlodipine  and olmesartan .  Advised to minimize salt intake. - Continue amlodipine  10 mg daily. - Continue olmesartan  40/25 mg daily. - Advise to minimize salt intake, including pink Himalayan salt. - Encourage use of alternative seasonings like onion powder and seasoned pepper.  Orders: -     CMP14+EGFR  Pure hypercholesterolemia Assessment & Plan: Chronic, LDL goal < 70. LDL cholesterol 88 mg/dL, above target. On Repatha , muscle pain with statins. Zetia  considered if LDL remains high. Fiber intake encouraged. - Recheck cholesterol levels at the beginning of the year. - Consider starting Zetia  if LDL cholesterol remains above target. - Encourage increased fiber intake.  Orders: -     CMP14+EGFR  Idiopathic chronic gout of left ankle without tophus Assessment & Plan: Chronic, currently stable.  On allopurinol  for management, uses colchicine  for flares. - Continue allopurinol  as prescribed. - Use colchicine  as needed for gout flares. - Avoid known triggers   Statin myopathy Assessment & Plan: Chronic, currently on Repatha .  - Iintolerant of more than 2-3 statins.   Class 2 severe obesity due to excess calories with serious comorbidity and body mass index (BMI) of 35.0 to 35.9 in adult Assessment & Plan: Engaging in exercise 2-3 times a week, reports changes in clothing fit. - Encourage regular exercise. - Aim for at least 150 minutes of exercise per week.     Return for 4 MONTH DM F/U.SABRA  Patient was given opportunity to ask questions. Patient verbalized understanding of the plan and was able to repeat key elements of the plan. All questions were answered to their satisfaction.   I, Catheryn LOISE Slocumb, MD, have reviewed all documentation for this visit. The documentation on 11/28/23 for the exam, diagnosis, procedures, and orders are all accurate and complete.   IF YOU HAVE BEEN REFERRED TO A SPECIALIST, IT MAY TAKE 1-2 WEEKS TO SCHEDULE/PROCESS THE REFERRAL. IF YOU HAVE NOT HEARD FROM US /SPECIALIST IN  TWO WEEKS, PLEASE GIVE US  A CALL AT [phone].   THE PATIENT IS ENCOURAGED TO PRACTICE SOCIAL DISTANCING DUE TO THE COVID-19 PANDEMIC.

## 2023-11-28 NOTE — Assessment & Plan Note (Signed)
 Engaging in exercise 2-3 times a week, reports changes in clothing fit. - Encourage regular exercise. - Aim for at least 150 minutes of exercise per week.

## 2023-11-29 LAB — HEMOGLOBIN A1C
Est. average glucose Bld gHb Est-mCnc: 128 mg/dL
Hgb A1c MFr Bld: 6.1 % — ABNORMAL HIGH (ref 4.8–5.6)

## 2023-11-29 LAB — CMP14+EGFR
ALT: 16 IU/L (ref 0–32)
AST: 18 IU/L (ref 0–40)
Albumin: 4.2 g/dL (ref 3.9–4.9)
Alkaline Phosphatase: 131 IU/L (ref 49–135)
BUN/Creatinine Ratio: 18 (ref 12–28)
BUN: 15 mg/dL (ref 8–27)
Bilirubin Total: 0.4 mg/dL (ref 0.0–1.2)
CO2: 21 mmol/L (ref 20–29)
Calcium: 9.6 mg/dL (ref 8.7–10.3)
Chloride: 103 mmol/L (ref 96–106)
Creatinine, Ser: 0.85 mg/dL (ref 0.57–1.00)
Globulin, Total: 2.5 g/dL (ref 1.5–4.5)
Glucose: 118 mg/dL — ABNORMAL HIGH (ref 70–99)
Potassium: 4.1 mmol/L (ref 3.5–5.2)
Sodium: 142 mmol/L (ref 134–144)
Total Protein: 6.7 g/dL (ref 6.0–8.5)
eGFR: 76 mL/min/1.73 (ref >59)

## 2023-12-04 ENCOUNTER — Ambulatory Visit: Payer: Self-pay | Admitting: Internal Medicine

## 2023-12-19 LAB — HM MAMMOGRAPHY

## 2023-12-20 ENCOUNTER — Encounter: Payer: Self-pay | Admitting: Internal Medicine

## 2023-12-22 ENCOUNTER — Other Ambulatory Visit: Payer: Self-pay | Admitting: Internal Medicine

## 2024-01-04 ENCOUNTER — Encounter: Payer: Self-pay | Admitting: Pharmacist

## 2024-01-04 NOTE — Progress Notes (Signed)
   01/04/2024  Patient ID: Christobal CHRISTELLA Flemings, female   DOB: Jun 26, 1955, 68 y.o.   MRN: [id number]  [employer] sent a notification to the office that Ms. Tillis was eligible to get a refill on Ozempic . Clinic staff completed an application and obtained Provider signature. Form was faxed to Luke Mall, CPhT for scanning and processing.    Lab Results  Component Value Date   HGBA1C 6.1 (H) 11/28/2023   HGBA1C 6.3 (H) 07/25/2023   HGBA1C 6.6 (H) 04/07/2023    Metformin  1000 mg daily ( on list but no fill history) Tresiba  10 units daily  Not on statin therapy--On Repatha  documented statin intolerance G72.0 associated with 11/28/23 office visit note.  Lipid Panel     Component Value Date/Time   CHOL 162 07/25/2023 1048   TRIG 143 07/25/2023 1048   HDL 49 07/25/2023 1048   CHOLHDL 3.3 07/25/2023 1048   LDLCALC 88 07/25/2023 1048   LABVLDL 25 07/25/2023 1048      On ARB - Olmesartan  -Hydrochlorothiazide  40/25 filled 09/24/23 #90  Cassius DOROTHA Brought, PharmD, BCACP Clinical Pharmacist [phone]

## 2024-01-10 ENCOUNTER — Telehealth: Payer: Self-pay

## 2024-01-10 NOTE — Telephone Encounter (Signed)
 PAP: Patient assistance application for Tresiba  through [employer] has been mailed to pt's home address on file. Provider portion of application will be faxed to provider's office. Patient portion e-filed.

## 2024-01-12 ENCOUNTER — Other Ambulatory Visit (HOSPITAL_COMMUNITY): Payer: Self-pay

## 2024-01-12 NOTE — Telephone Encounter (Signed)
 PAP: Application for Ashley Monroe has been submitted to [employer], via fax

## 2024-01-13 ENCOUNTER — Encounter: Payer: Self-pay | Admitting: Pharmacist

## 2024-01-13 NOTE — Progress Notes (Signed)
   01/13/2024  Patient ID: Ashley Monroe, female   DOB: [birth year], 68 y.o.   MRN: [id number]   [employer] application completed, Provider signature obtained for Tresiba  and Novofine needles.  Lab Results  Component Value Date   HGBA1C 6.1 (H) 11/28/2023   HGBA1C 6.3 (H) 07/25/2023   HGBA1C 6.6 (H) 04/07/2023   Form was faxed back to Luke Mall, CPhT for processing and scanning.  Cassius DOROTHA Brought, PharmD, BCACP Clinical Pharmacist [phone]

## 2024-01-16 ENCOUNTER — Other Ambulatory Visit: Payer: Self-pay | Admitting: Internal Medicine

## 2024-01-23 NOTE — Telephone Encounter (Signed)
 PAP: Patient assistance application for Tresiba  has been approved by PAP Companies: [employer] from 02/09/2024 to 02/07/2025. Medication should be delivered to [facility]AP Delivery: Provider's office. For further shipping updates, please contact [employer] at [phone]. Patient ID is: [id number]

## 2024-04-03 ENCOUNTER — Ambulatory Visit: Payer: Self-pay | Admitting: Internal Medicine

## 2024-07-25 ENCOUNTER — Encounter: Payer: Self-pay | Admitting: Internal Medicine

## 2024-08-29 ENCOUNTER — Ambulatory Visit: Payer: Self-pay
